# Patient Record
Sex: Female | Born: 1980 | Race: Black or African American | Hispanic: No | Marital: Married | Smoking: Former smoker
Health system: Southern US, Community
[De-identification: ages and names within clinical notes are randomized; demographics above are authoritative.]

## PROBLEM LIST (undated history)

## (undated) DIAGNOSIS — N289 Disorder of kidney and ureter, unspecified: Secondary | ICD-10-CM

## (undated) DIAGNOSIS — D649 Anemia, unspecified: Secondary | ICD-10-CM

## (undated) DIAGNOSIS — K219 Gastro-esophageal reflux disease without esophagitis: Secondary | ICD-10-CM

## (undated) DIAGNOSIS — Z9109 Other allergy status, other than to drugs and biological substances: Secondary | ICD-10-CM

## (undated) DIAGNOSIS — F32A Depression, unspecified: Secondary | ICD-10-CM

## (undated) DIAGNOSIS — F419 Anxiety disorder, unspecified: Secondary | ICD-10-CM

## (undated) DIAGNOSIS — J45909 Unspecified asthma, uncomplicated: Secondary | ICD-10-CM

## (undated) HISTORY — PX: ABDOMINAL HYSTERECTOMY: SHX81

## (undated) HISTORY — PX: TUBAL LIGATION: SHX77

## (undated) SURGERY — Surgical Case
Anesthesia: *Unknown

---

## 2004-11-13 ENCOUNTER — Emergency Department: Payer: Self-pay | Admitting: Emergency Medicine

## 2005-01-23 ENCOUNTER — Observation Stay: Payer: Self-pay

## 2005-01-25 ENCOUNTER — Ambulatory Visit: Payer: Self-pay

## 2005-03-11 ENCOUNTER — Observation Stay: Payer: Self-pay | Admitting: Obstetrics & Gynecology

## 2005-05-23 ENCOUNTER — Inpatient Hospital Stay: Payer: Self-pay

## 2005-07-29 ENCOUNTER — Emergency Department: Payer: Self-pay | Admitting: Emergency Medicine

## 2005-07-29 ENCOUNTER — Emergency Department: Payer: Self-pay | Admitting: Internal Medicine

## 2005-12-10 ENCOUNTER — Emergency Department: Payer: Self-pay | Admitting: Emergency Medicine

## 2005-12-18 ENCOUNTER — Emergency Department: Payer: Self-pay | Admitting: Emergency Medicine

## 2006-03-31 ENCOUNTER — Observation Stay: Payer: Self-pay | Admitting: Obstetrics and Gynecology

## 2006-06-10 ENCOUNTER — Inpatient Hospital Stay: Payer: Self-pay | Admitting: Obstetrics and Gynecology

## 2006-06-12 ENCOUNTER — Emergency Department: Payer: Self-pay | Admitting: Emergency Medicine

## 2006-07-05 ENCOUNTER — Ambulatory Visit: Payer: Self-pay | Admitting: Obstetrics and Gynecology

## 2006-07-09 ENCOUNTER — Observation Stay: Payer: Self-pay

## 2006-07-11 ENCOUNTER — Encounter: Payer: Self-pay | Admitting: Maternal & Fetal Medicine

## 2006-07-25 ENCOUNTER — Inpatient Hospital Stay: Payer: Self-pay

## 2006-07-30 ENCOUNTER — Emergency Department: Payer: Self-pay | Admitting: Emergency Medicine

## 2006-08-01 ENCOUNTER — Encounter: Payer: Self-pay | Admitting: Maternal & Fetal Medicine

## 2007-06-06 ENCOUNTER — Emergency Department: Payer: Self-pay | Admitting: Emergency Medicine

## 2008-12-14 ENCOUNTER — Emergency Department: Payer: Self-pay | Admitting: Emergency Medicine

## 2009-01-11 ENCOUNTER — Emergency Department: Payer: Self-pay | Admitting: Emergency Medicine

## 2009-02-12 ENCOUNTER — Emergency Department: Payer: Self-pay | Admitting: Emergency Medicine

## 2009-03-01 ENCOUNTER — Emergency Department: Payer: Self-pay | Admitting: Unknown Physician Specialty

## 2009-03-15 ENCOUNTER — Emergency Department: Payer: Self-pay | Admitting: Emergency Medicine

## 2010-08-22 ENCOUNTER — Ambulatory Visit: Payer: Self-pay | Admitting: Internal Medicine

## 2010-11-03 ENCOUNTER — Ambulatory Visit: Payer: Self-pay | Admitting: Internal Medicine

## 2011-01-02 ENCOUNTER — Ambulatory Visit: Payer: Self-pay | Admitting: Internal Medicine

## 2011-02-11 ENCOUNTER — Ambulatory Visit: Payer: Self-pay | Admitting: Internal Medicine

## 2011-07-30 ENCOUNTER — Ambulatory Visit: Payer: Self-pay | Admitting: Family Medicine

## 2011-10-23 ENCOUNTER — Ambulatory Visit: Payer: Self-pay | Admitting: Internal Medicine

## 2012-02-16 ENCOUNTER — Emergency Department: Payer: Self-pay | Admitting: Emergency Medicine

## 2012-03-11 ENCOUNTER — Ambulatory Visit: Payer: Self-pay | Admitting: Emergency Medicine

## 2012-11-28 ENCOUNTER — Ambulatory Visit: Payer: Self-pay | Admitting: Family Medicine

## 2013-04-03 ENCOUNTER — Ambulatory Visit: Payer: Self-pay | Admitting: Physician Assistant

## 2013-04-18 ENCOUNTER — Ambulatory Visit: Payer: Self-pay | Admitting: Physician Assistant

## 2013-07-03 ENCOUNTER — Emergency Department: Payer: Self-pay | Admitting: Emergency Medicine

## 2013-11-13 ENCOUNTER — Ambulatory Visit: Payer: Self-pay | Admitting: Family Medicine

## 2014-02-26 ENCOUNTER — Ambulatory Visit: Payer: Self-pay

## 2014-02-26 LAB — COMPREHENSIVE METABOLIC PANEL
ALBUMIN: 3.5 g/dL (ref 3.4–5.0)
ALK PHOS: 76 U/L
Anion Gap: 11 (ref 7–16)
BILIRUBIN TOTAL: 0.5 mg/dL (ref 0.2–1.0)
BUN: 11 mg/dL (ref 7–18)
CHLORIDE: 104 mmol/L (ref 98–107)
CO2: 24 mmol/L (ref 21–32)
Calcium, Total: 8.8 mg/dL (ref 8.5–10.1)
Creatinine: 0.83 mg/dL (ref 0.60–1.30)
GLUCOSE: 92 mg/dL (ref 65–99)
OSMOLALITY: 277 (ref 275–301)
POTASSIUM: 4 mmol/L (ref 3.5–5.1)
SGOT(AST): 14 U/L — ABNORMAL LOW (ref 15–37)
SGPT (ALT): 23 U/L
Sodium: 139 mmol/L (ref 136–145)
TOTAL PROTEIN: 6.6 g/dL (ref 6.4–8.2)

## 2014-02-26 LAB — CBC WITH DIFFERENTIAL/PLATELET
Basophil #: 0 10*3/uL (ref 0.0–0.1)
Basophil %: 0.9 %
EOS ABS: 0.3 10*3/uL (ref 0.0–0.7)
Eosinophil %: 4.7 %
HCT: 40 % (ref 35.0–47.0)
HGB: 12.9 g/dL (ref 12.0–16.0)
LYMPHS PCT: 36.5 %
Lymphocyte #: 2 10*3/uL (ref 1.0–3.6)
MCH: 28.3 pg (ref 26.0–34.0)
MCHC: 32.1 g/dL (ref 32.0–36.0)
MCV: 88 fL (ref 80–100)
MONO ABS: 0.3 x10 3/mm (ref 0.2–0.9)
MONOS PCT: 6.3 %
Neutrophil #: 2.9 10*3/uL (ref 1.4–6.5)
Neutrophil %: 51.6 %
Platelet: 267 10*3/uL (ref 150–440)
RBC: 4.55 10*6/uL (ref 3.80–5.20)
RDW: 14.5 % (ref 11.5–14.5)
WBC: 5.5 10*3/uL (ref 3.6–11.0)

## 2014-06-05 ENCOUNTER — Ambulatory Visit: Payer: Self-pay | Admitting: Internal Medicine

## 2014-08-15 ENCOUNTER — Other Ambulatory Visit: Payer: Self-pay

## 2014-08-15 ENCOUNTER — Emergency Department
Admission: EM | Admit: 2014-08-15 | Discharge: 2014-08-15 | Disposition: A | Discharge status: Home / Self Care | Payer: 59 | Attending: Emergency Medicine | Admitting: Emergency Medicine

## 2014-08-15 ENCOUNTER — Encounter: Payer: Self-pay | Admitting: Emergency Medicine

## 2014-08-15 DIAGNOSIS — Z7952 Long term (current) use of systemic steroids: Secondary | ICD-10-CM | POA: Insufficient documentation

## 2014-08-15 DIAGNOSIS — R0602 Shortness of breath: Secondary | ICD-10-CM | POA: Diagnosis present

## 2014-08-15 DIAGNOSIS — J45901 Unspecified asthma with (acute) exacerbation: Secondary | ICD-10-CM | POA: Diagnosis not present

## 2014-08-15 DIAGNOSIS — Z87891 Personal history of nicotine dependence: Secondary | ICD-10-CM | POA: Diagnosis not present

## 2014-08-15 HISTORY — DX: Unspecified asthma, uncomplicated: J45.909

## 2014-08-15 MED ORDER — PREDNISONE 20 MG PO TABS
ORAL_TABLET | ORAL | Status: AC
  Administered 2014-08-15: 40 mg via ORAL
  Filled 2014-08-15: qty 2

## 2014-08-15 MED ORDER — IPRATROPIUM-ALBUTEROL 0.5-2.5 (3) MG/3ML IN SOLN
3.0000 mL | Freq: Once | RESPIRATORY_TRACT | Status: AC
  Administered 2014-08-15: 3 mL via RESPIRATORY_TRACT

## 2014-08-15 MED ORDER — PREDNISONE 20 MG PO TABS
40.0000 mg | ORAL_TABLET | Freq: Once | ORAL | Status: AC
  Administered 2014-08-15: 40 mg via ORAL

## 2014-08-15 MED ORDER — IPRATROPIUM-ALBUTEROL 0.5-2.5 (3) MG/3ML IN SOLN
RESPIRATORY_TRACT | Status: AC
  Administered 2014-08-15: 3 mL via RESPIRATORY_TRACT
  Filled 2014-08-15: qty 3

## 2014-08-15 MED ORDER — PREDNISONE 20 MG PO TABS: 40.0000 mg | ORAL_TABLET | Freq: Every day | ORAL | Status: DC

## 2014-08-15 NOTE — ED Notes (Signed)
MD at bedside. 

## 2014-08-15 NOTE — ED Notes (Signed)
Pt reports that she feels SOB and congestion in morning and nighttime. States hx of allergies; this feels similar when seasonal allergies are bothered. Pt denies fever. Denies productive cough. Sx for 1 week. Pt has tried OTC drugs. Denies CP. Pt alert and oriented X4, active, cooperative, pt in NAD. RR even and unlabored, color WNL.

## 2014-08-15 NOTE — ED Provider Notes (Signed)
[facility] Emergency Department Provider Note    ____________________________________________  Time seen: 0805  I have reviewed the triage vital signs and the nursing notes.   HISTORY  Chief Complaint Shortness of Breath   History limited by: Not Limited   HPI Sabrina Holder is a 34 y.o. female who presents to the emergency department because of worsening shortness of breath and congestion in the nighttime and morning. She feels the congestion most in the base of her throat. This is been going on for roughly 1 week. It is worse in the mornings and at night. The symptoms are moderate. She has had similar symptoms in the past. Has tried taking her home nebulizer treatments without great effect. She has had a nonproductive cough. She denies any fevers.     Past Medical History  Diagnosis Date  . Asthma     There are no active problems to display for this patient.   Past Surgical History  Procedure Laterality Date  . Cesarean section    . Tubal ligation      Current Outpatient Rx  Name  Route  Sig  Dispense  Refill  . predniSONE (DELTASONE) 20 MG tablet   Oral   Take 2 tablets (40 mg total) by mouth daily. Start taking on 08/16/2014   8 tablet   0     Allergies Review of patient's allergies indicates no known allergies.  No family history on file.  Social History History  Substance Use Topics  . Smoking status: Former Research scientist (life sciences)  . Smokeless tobacco: Not on file  . Alcohol Use: No    Review of Systems  Constitutional: Negative for fever. Cardiovascular: Positive for chest tightness Respiratory: Positive for shortness of breath. Positive for nonproductive cough Gastrointestinal: Negative for abdominal pain, vomiting and diarrhea. Genitourinary: Negative for dysuria. Musculoskeletal: Negative for back pain. Skin: Negative for rash. Neurological: Negative for headaches, focal weakness or numbness.   10-point ROS otherwise  negative.  ____________________________________________   PHYSICAL EXAM:  VITAL SIGNS: ED Triage Vitals  Enc Vitals Group     BP 08/15/14 0751 120/70 mmHg     Pulse Rate 08/15/14 0751 84     Resp 08/15/14 0751 22     Temp 08/15/14 0751 98.5 F (36.9 C)     Temp Source 08/15/14 0751 Oral     SpO2 08/15/14 0751 97 %    Constitutional: Alert and oriented. Well appearing and in no distress. Eyes: Conjunctivae are normal. PERRL. Normal extraocular movements. ENT   Head: Normocephalic and atraumatic.   Nose: No congestion/rhinnorhea.   Mouth/Throat: Mucous membranes are moist.   Neck: No stridor. Hematological/Lymphatic/Immunilogical: No cervical lymphadenopathy. Cardiovascular: Normal rate, regular rhythm.  No murmurs, rubs, or gallops. Respiratory: Normal respiratory effort without tachypnea nor retractions. Diffuse bilateral expiratory wheeze Gastrointestinal: Soft and nontender. No distention.  Genitourinary: Deferred Musculoskeletal: Normal range of motion in all extremities. No joint effusions.  No lower extremity tenderness nor edema. Neurologic:  Normal speech and language. No gross focal neurologic deficits are appreciated. Speech is normal.  Skin:  Skin is warm, dry and intact. No rash noted. Psychiatric: Mood and affect are normal. Speech and behavior are normal. Patient exhibits appropriate insight and judgment.  ____________________________________________    LABS (pertinent positives/negatives)  None  ____________________________________________   EKG  EKG Time: 0804 Rate: 79 Rhythm: Normal sinus rhythm Axis: Normal Intervals: QTc 417 QRS: Q waves in V1 ST changes: No ST elevation    ____________________________________________  RADIOLOGY  None  ____________________________________________   PROCEDURES  Procedure(s) performed: None  Critical Care performed: No  ____________________________________________   INITIAL  IMPRESSION / ASSESSMENT AND PLAN / ED COURSE  Pertinent labs & imaging results that were available during my care of the patient were reviewed by me and considered in my medical decision making (see chart for details).  Patient here with shortness of breath that is worse at night and mornings. This is likely an asthma exacerbation. Patient without any other concerning findings for pneumonia. Will give steroids and neb treatment.  ____________________________________________   FINAL CLINICAL IMPRESSION(S) / ED DIAGNOSES  Final diagnoses:  Asthma exacerbation     Nance Pear, MD 08/15/14 [phone]

## 2014-08-15 NOTE — ED Notes (Signed)
Pt reports starting about a week ago difficulty breathing that is worse in the morning. Pt denies fever. Pt also reports nasal drainage. Pt also endorses dry cough

## 2014-08-15 NOTE — Discharge Instructions (Signed)
Please seek medical attention for any high fevers, chest pain, shortness of breath, change in behavior, persistent vomiting, bloody stool or any other new or concerning symptoms.  Asthma, Acute Bronchospasm Acute bronchospasm caused by asthma is also referred to as an asthma attack. Bronchospasm means your air passages become narrowed. The narrowing is caused by inflammation and tightening of the muscles in the air tubes (bronchi) in your lungs. This can make it hard to breathe or cause you to wheeze and cough. CAUSES Possible triggers are:  Animal dander from the skin, hair, or feathers of animals.  Dust mites contained in house dust.  Cockroaches.  Pollen from trees or grass.  Mold.  Cigarette or tobacco smoke.  Air pollutants such as dust, household cleaners, hair sprays, aerosol sprays, paint fumes, strong chemicals, or strong odors.  Cold air or weather changes. Cold air may trigger inflammation. Winds increase molds and pollens in the air.  Strong emotions such as crying or laughing hard.  Stress.  Certain medicines such as aspirin or beta-blockers.  Sulfites in foods and drinks, such as dried fruits and wine.  Infections or inflammatory conditions, such as a flu, cold, or inflammation of the nasal membranes (rhinitis).  Gastroesophageal reflux disease (GERD). GERD is a condition where stomach acid backs up into your esophagus.  Exercise or strenuous activity. SIGNS AND SYMPTOMS   Wheezing.  Excessive coughing, particularly at night.  Chest tightness.  Shortness of breath. DIAGNOSIS  Your health care provider will ask you about your medical history and perform a physical exam. A chest X-ray or blood testing may be performed to look for other causes of your symptoms or other conditions that may have triggered your asthma attack. TREATMENT  Treatment is aimed at reducing inflammation and opening up the airways in your lungs. Most asthma attacks are treated with  inhaled medicines. These include quick relief or rescue medicines (such as bronchodilators) and controller medicines (such as inhaled corticosteroids). These medicines are sometimes given through an inhaler or a nebulizer. Systemic steroid medicine taken by mouth or given through an IV tube also can be used to reduce the inflammation when an attack is moderate or severe. Antibiotic medicines are only used if a bacterial infection is present.  HOME CARE INSTRUCTIONS   Rest.  Drink plenty of liquids. This helps the mucus to remain thin and be easily coughed up. Only use caffeine in moderation and do not use alcohol until you have recovered from your illness.  Do not smoke. Avoid being exposed to secondhand smoke.  You play a critical role in keeping yourself in good health. Avoid exposure to things that cause you to wheeze or to have breathing problems.  Keep your medicines up-to-date and available. Carefully follow your health care provider's treatment plan.  Take your medicine exactly as prescribed.  When pollen or pollution is bad, keep windows closed and use an air conditioner or go to places with air conditioning.  Asthma requires careful medical care. See your health care provider for a follow-up as advised. If you are more than [redacted] weeks pregnant and you were prescribed any new medicines, let your obstetrician know about the visit and how you are doing. Follow up with your health care provider as directed.  After you have recovered from your asthma attack, make an appointment with your outpatient doctor to talk about ways to reduce the likelihood of future attacks. If you do not have a doctor who manages your asthma, make an appointment with a  primary care doctor to discuss your asthma. SEEK IMMEDIATE MEDICAL CARE IF:   You are getting worse.  You have trouble breathing. If severe, call your local emergency services (911 in the [place]  You develop chest pain or discomfort.  You are  vomiting.  You are not able to keep fluids down.  You are coughing up yellow, green, brown, or bloody sputum.  You have a fever and your symptoms suddenly get worse.  You have trouble swallowing. MAKE SURE YOU:   Understand these instructions.  Will watch your condition.  Will get help right away if you are not doing well or get worse. Document Released: 07/04/2006 Document Revised: 03/24/2013 Document Reviewed: 09/24/2012 [facility] Patient Information 2015 [place], [place]. This information is not intended to replace advice given to you by your health care provider. Make sure you discuss any questions you have with your health care provider.

## 2014-08-15 NOTE — ED Notes (Signed)
Pt alert and oriented X4, active, cooperative, pt in NAD. RR even and unlabored, color WNL.  Pt informed to return if any life threatening symptoms occur.  Pt states that she feels better after breathing treatment.

## 2014-10-14 ENCOUNTER — Ambulatory Visit
Admission: RE | Admit: 2014-10-14 | Discharge: 2014-10-14 | Disposition: A | Discharge status: Home / Self Care | Payer: 59 | Source: Ambulatory Visit | Attending: Family Medicine | Admitting: Family Medicine

## 2014-10-14 ENCOUNTER — Other Ambulatory Visit: Payer: Self-pay | Admitting: Family Medicine

## 2014-10-14 DIAGNOSIS — M79671 Pain in right foot: Secondary | ICD-10-CM

## 2015-02-27 ENCOUNTER — Encounter: Payer: Self-pay | Admitting: Emergency Medicine

## 2015-02-27 ENCOUNTER — Emergency Department: Payer: 59

## 2015-02-27 ENCOUNTER — Emergency Department
Admission: EM | Admit: 2015-02-27 | Discharge: 2015-02-27 | Disposition: A | Discharge status: Home / Self Care | Payer: 59 | Attending: Emergency Medicine | Admitting: Emergency Medicine

## 2015-02-27 DIAGNOSIS — Z87891 Personal history of nicotine dependence: Secondary | ICD-10-CM | POA: Diagnosis not present

## 2015-02-27 DIAGNOSIS — J45901 Unspecified asthma with (acute) exacerbation: Secondary | ICD-10-CM | POA: Diagnosis not present

## 2015-02-27 DIAGNOSIS — J45909 Unspecified asthma, uncomplicated: Secondary | ICD-10-CM | POA: Diagnosis present

## 2015-02-27 MED ORDER — IPRATROPIUM-ALBUTEROL 0.5-2.5 (3) MG/3ML IN SOLN
3.0000 mL | Freq: Once | RESPIRATORY_TRACT | Status: AC
  Administered 2015-02-27: 3 mL via RESPIRATORY_TRACT
  Filled 2015-02-27: qty 3

## 2015-02-27 MED ORDER — PREDNISONE 20 MG PO TABS: 40.0000 mg | ORAL_TABLET | Freq: Every day | ORAL | Status: DC

## 2015-02-27 MED ORDER — IPRATROPIUM-ALBUTEROL 0.5-2.5 (3) MG/3ML IN SOLN
3.0000 mL | Freq: Once | RESPIRATORY_TRACT | Status: AC
Start: 2015-02-27 — End: 2015-02-27
  Administered 2015-02-27: 3 mL via RESPIRATORY_TRACT
  Filled 2015-02-27: qty 3

## 2015-02-27 MED ORDER — PREDNISONE 20 MG PO TABS
60.0000 mg | ORAL_TABLET | Freq: Once | ORAL | Status: AC
  Administered 2015-02-27: 60 mg via ORAL
  Filled 2015-02-27: qty 3

## 2015-02-27 NOTE — ED Notes (Signed)
Usually has increased asthma symptoms during season change, denies fevers

## 2015-02-27 NOTE — Discharge Instructions (Signed)
Please seek medical attention for any high fevers, chest pain, shortness of breath, change in behavior, persistent vomiting, bloody stool or any other new or concerning symptoms. ° ° °Asthma, Acute Bronchospasm °Acute bronchospasm caused by asthma is also referred to as an asthma attack. Bronchospasm means your air passages become narrowed. The narrowing is caused by inflammation and tightening of the muscles in the air tubes (bronchi) in your lungs. This can make it hard to breathe or cause you to wheeze and cough. °CAUSES °Possible triggers are: °· Animal dander from the skin, hair, or feathers of animals. °· Dust mites contained in house dust. °· Cockroaches. °· Pollen from trees or grass. °· Mold. °· Cigarette or tobacco smoke. °· Air pollutants such as dust, household cleaners, hair sprays, aerosol sprays, paint fumes, strong chemicals, or strong odors. °· Cold air or weather changes. Cold air may trigger inflammation. Winds increase molds and pollens in the air. °· Strong emotions such as crying or laughing hard. °· Stress. °· Certain medicines such as aspirin or beta-blockers. °· Sulfites in foods and drinks, such as dried fruits and wine. °· Infections or inflammatory conditions, such as a flu, cold, or inflammation of the nasal membranes (rhinitis). °· Gastroesophageal reflux disease (GERD). GERD is a condition where stomach acid backs up into your esophagus. °· Exercise or strenuous activity. °SIGNS AND SYMPTOMS  °· Wheezing. °· Excessive coughing, particularly at night. °· Chest tightness. °· Shortness of breath. °DIAGNOSIS  °Your health care provider will ask you about your medical history and perform a physical exam. A chest X-ray or blood testing may be performed to look for other causes of your symptoms or other conditions that may have triggered your asthma attack.  °TREATMENT  °Treatment is aimed at reducing inflammation and opening up the airways in your lungs.  Most asthma attacks are treated with  inhaled medicines. These include quick relief or rescue medicines (such as bronchodilators) and controller medicines (such as inhaled corticosteroids). These medicines are sometimes given through an inhaler or a nebulizer. Systemic steroid medicine taken by mouth or given through an IV tube also can be used to reduce the inflammation when an attack is moderate or severe. Antibiotic medicines are only used if a bacterial infection is present.  °HOME CARE INSTRUCTIONS  °· Rest. °· Drink plenty of liquids. This helps the mucus to remain thin and be easily coughed up. Only use caffeine in moderation and do not use alcohol until you have recovered from your illness. °· Do not smoke. Avoid being exposed to secondhand smoke. °· You play a critical role in keeping yourself in good health. Avoid exposure to things that cause you to wheeze or to have breathing problems. °· Keep your medicines up-to-date and available. Carefully follow your health care provider's treatment plan. °· Take your medicine exactly as prescribed. °· When pollen or pollution is bad, keep windows closed and use an air conditioner or go to places with air conditioning. °· Asthma requires careful medical care. See your health care provider for a follow-up as advised. If you are more than [redacted] weeks pregnant and you were prescribed any new medicines, let your obstetrician know about the visit and how you are doing. Follow up with your health care provider as directed. °· After you have recovered from your asthma attack, make an appointment with your outpatient doctor to talk about ways to reduce the likelihood of future attacks. If you do not have a doctor who manages your asthma, make an appointment with   a primary care doctor to discuss your asthma. °SEEK IMMEDIATE MEDICAL CARE IF:  °· You are getting worse. °· You have trouble breathing. If severe, call your local emergency services (911 in the [place] °· You develop chest pain or discomfort. °· You are  vomiting. °· You are not able to keep fluids down. °· You are coughing up yellow, green, brown, or bloody sputum. °· You have a fever and your symptoms suddenly get worse. °· You have trouble swallowing. °MAKE SURE YOU:  °· Understand these instructions. °· Will watch your condition. °· Will get help right away if you are not doing well or get worse. °  °This information is not intended to replace advice given to you by your health care provider. Make sure you discuss any questions you have with your health care provider. °  °Document Released: 07/04/2006 Document Revised: 03/24/2013 Document Reviewed: 09/24/2012 °Elsevier Interactive Patient Education ©2016 [employer] ° °

## 2015-02-27 NOTE — ED Notes (Signed)
Pt discharged home after verbalizing understanding of discharge instructions; nad noted. 

## 2015-02-27 NOTE — ED Provider Notes (Signed)
[facility] Emergency Department Provider Note    ____________________________________________  Time seen: 17  I have reviewed the triage vital signs and the nursing notes.   HISTORY  Chief Complaint Asthma   History limited by: Not Limited   HPI Sabrina Holder is a 34 y.o. female with history of asthma who presents to the emergency department today with concerns for shortness of breath. She states this is been going on for the past couple of days. It has got progressively worse. She has tried using her breathing treatments with only minimal relief. She had some associated chest tightness. Patient states that she gets similar symptoms frequently when the weather changes. She feels like she has postnasal drip. She denies any fevers.   Past Medical History  Diagnosis Date  . Asthma     There are no active problems to display for this patient.   Past Surgical History  Procedure Laterality Date  . Cesarean section    . Tubal ligation      Current Outpatient Rx  Name  Route  Sig  Dispense  Refill  . predniSONE (DELTASONE) 20 MG tablet   Oral   Take 2 tablets (40 mg total) by mouth daily. Start taking on 08/16/2014   8 tablet   0     Allergies Review of patient's allergies indicates no known allergies.  No family history on file.  Social History Social History  Substance Use Topics  . Smoking status: Former Research scientist (life sciences)  . Smokeless tobacco: None  . Alcohol Use: No    Review of Systems  Constitutional: Negative for fever. Cardiovascular: Positive for chest tightness Respiratory: Positive for shortness of breath. Gastrointestinal: Negative for abdominal pain, vomiting and diarrhea. Genitourinary: Negative for dysuria. Musculoskeletal: Negative for back pain. Skin: Negative for rash. Neurological: Negative for headaches, focal weakness or numbness. 10-point ROS otherwise  negative.  ____________________________________________   PHYSICAL EXAM:  VITAL SIGNS: ED Triage Vitals  Enc Vitals Group     BP 02/27/15 0821 122/66 mmHg     Pulse Rate 02/27/15 0821 81     Resp 02/27/15 0821 20     Temp 02/27/15 0821 98.6 F (37 C)     Temp Source 02/27/15 0821 Oral     SpO2 --      Weight 02/27/15 0821 215 lb (97.523 kg)     Height 02/27/15 0821 5\' 4"  (1.626 m)   Constitutional: Alert and oriented. Well appearing and in no distress. Eyes: Conjunctivae are normal. PERRL. Normal extraocular movements. ENT   Head: Normocephalic and atraumatic.   Nose: No congestion/rhinnorhea.   Mouth/Throat: Mucous membranes are moist.   Neck: No stridor. Hematological/Lymphatic/Immunilogical: No cervical lymphadenopathy. Cardiovascular: Normal rate, regular rhythm.  No murmurs, rubs, or gallops. Respiratory: Normal respiratory effort without tachypnea nor retractions. Diffuse wheezes Gastrointestinal: Soft and nontender. No distention.  Genitourinary: Deferred Musculoskeletal: Normal range of motion in all extremities. No joint effusions.  No lower extremity tenderness nor edema. Neurologic:  Normal speech and language. No gross focal neurologic deficits are appreciated.  Skin:  Skin is warm, dry and intact. No rash noted. Psychiatric: Mood and affect are normal. Speech and behavior are normal. Patient exhibits appropriate insight and judgment.  ____________________________________________    LABS (pertinent positives/negatives)  None ____________________________________________   EKG  I, Nance Pear, attending physician, personally viewed and interpreted this EKG  EKG Time: 0847 Rate: 80 Rhythm: NSR Axis: normal Intervals: qtc 439 QRS: narrow ST changes: no st elevation Impression: normal ekg  ____________________________________________    RADIOLOGY  CXR  IMPRESSION: Negative chest  x-ray.   ____________________________________________   PROCEDURES  Procedure(s) performed: None  Critical Care performed: No  ____________________________________________   INITIAL IMPRESSION / ASSESSMENT AND PLAN / ED COURSE  Pertinent labs & imaging results that were available during my care of the patient were reviewed by me and considered in my medical decision making (see chart for details).  Patient presented to the emergency department today with some concerns for shortness of breath and asthma exacerbation. On initial exam patient did have diffuse bilateral wheezing. Patient was given prednisone and DuoNeb treatments. She did state she felt better after this treatment. The patient had much improved breath sounds with very minimal wheezing. Will discharge home with prednisone.  ____________________________________________   FINAL CLINICAL IMPRESSION(S) / ED DIAGNOSES  Final diagnoses:  Asthma exacerbation     Nance Pear, MD 02/27/15 1300

## 2015-03-23 ENCOUNTER — Encounter: Payer: Self-pay | Admitting: Emergency Medicine

## 2015-03-23 ENCOUNTER — Ambulatory Visit
Admission: EM | Admit: 2015-03-23 | Discharge: 2015-03-23 | Disposition: A | Discharge status: Home / Self Care | Payer: 59 | Attending: Family Medicine | Admitting: Family Medicine

## 2015-03-23 DIAGNOSIS — J45901 Unspecified asthma with (acute) exacerbation: Secondary | ICD-10-CM | POA: Diagnosis not present

## 2015-03-23 DIAGNOSIS — Z91048 Other nonmedicinal substance allergy status: Secondary | ICD-10-CM

## 2015-03-23 DIAGNOSIS — J9801 Acute bronchospasm: Secondary | ICD-10-CM | POA: Diagnosis not present

## 2015-03-23 DIAGNOSIS — Z9109 Other allergy status, other than to drugs and biological substances: Secondary | ICD-10-CM

## 2015-03-23 HISTORY — DX: Other allergy status, other than to drugs and biological substances: Z91.09

## 2015-03-23 MED ORDER — MONTELUKAST SODIUM 10 MG PO TABS: 10.0000 mg | ORAL_TABLET | Freq: Every day | ORAL | Status: DC

## 2015-03-23 MED ORDER — METHYLPREDNISOLONE SODIUM SUCC 125 MG IJ SOLR
125.0000 mg | Freq: Once | INTRAMUSCULAR | Status: AC
  Administered 2015-03-23: 125 mg via INTRAMUSCULAR

## 2015-03-23 MED ORDER — FEXOFENADINE-PSEUDOEPHED ER 180-240 MG PO TB24: 1.0000 | ORAL_TABLET | Freq: Every day | ORAL | Status: DC

## 2015-03-23 MED ORDER — IPRATROPIUM-ALBUTEROL 0.5-2.5 (3) MG/3ML IN SOLN
3.0000 mL | Freq: Four times a day (QID) | RESPIRATORY_TRACT | Status: DC
  Administered 2015-03-23 (×2): 3 mL via RESPIRATORY_TRACT

## 2015-03-23 MED ORDER — PREDNISONE 10 MG (21) PO TBPK: ORAL_TABLET | ORAL | Status: DC

## 2015-03-23 NOTE — ED Provider Notes (Signed)
CSN: [id number]     Arrival date & time 03/23/15  1706 History   First MD Initiated Contact with Patient 03/23/15 1805       Nurses notes were reviewed. Chief Complaint  Patient presents with  . Shortness of Breath   patient reports shortness of breath over 3 weeks.. She reports going to the ED she was placed on a 3 day regimen of prednisone but states that that not enough and a few days later she was just as bad as she was before. She reports shortness of breath is continued to get worse and finally she came in here to be seen. She states that she was unable to get into her PCPs office and wanted to have something done before Christmas she does have a history of bronchial callus and also asthma. She states that she's been having trouble with her allergies lately with increased congestion especially at night. She has been on Singulair before was currently out of it she states that she is running out of her nebulizer medication that she has a home and that she has used Zyrtec before but Zyrtec makes her sleepy so she is afraid to take it at night. She does not have any type of discoloration to sputum does not feel like there is an infection but that this is more from her allergies and asthma exacerbation.  (Consider location/radiation/quality/duration/timing/severity/associated sxs/prior Treatment) Patient is a 34 y.o. female presenting with shortness of breath. The history is provided by the patient. No language interpreter was used.  Shortness of Breath Severity:  Moderate Duration:  3 weeks Timing:  Constant Progression:  Worsening Chronicity:  New Context: known allergens, URI and weather changes   Relieved by:  Inhaler Ineffective treatments:  Inhaler and position changes Associated symptoms: cough   Associated symptoms: no chest pain, no fever, no rash, no sore throat and no swollen glands   Risk factors: no recent surgery and no tobacco use     Past Medical History  Diagnosis Date   . Asthma   . Environmental allergies    Past Surgical History  Procedure Laterality Date  . Cesarean section    . Tubal ligation     History reviewed. No pertinent family history. Social History  Substance Use Topics  . Smoking status: Former Research scientist (life sciences)  . Smokeless tobacco: None  . Alcohol Use: No   OB History    Gravida Para Term Preterm AB TAB SAB Ectopic Multiple Living   5 5 5             Review of Systems  Constitutional: Negative for fever.  HENT: Negative for sore throat.   Respiratory: Positive for cough and shortness of breath.   Cardiovascular: Negative for chest pain.  Skin: Negative for rash.  All other systems reviewed and are negative.   Allergies  Review of patient's allergies indicates no known allergies.  Home Medications   Prior to Admission medications   Medication Sig Start Date End Date Taking? Authorizing Provider  ALBUTEROL IN Inhale into the lungs.   Yes Historical Provider, MD  Cetirizine HCl (ZYRTEC ALLERGY PO) Take by mouth.   Yes Historical Provider, MD  fluticasone (FLONASE) 50 MCG/ACT nasal spray Place into both nostrils daily.   Yes Historical Provider, MD  Montelukast Sodium (SINGULAIR PO) Take by mouth.   Yes Historical Provider, MD  fexofenadine-pseudoephedrine (ALLEGRA-D ALLERGY & CONGESTION) 180-240 MG 24 hr tablet Take 1 tablet by mouth daily. 03/23/15   Frederich Cha, MD  montelukast (  SINGULAIR) 10 MG tablet Take 1 tablet (10 mg total) by mouth at bedtime. 03/23/15   Frederich Cha, MD  predniSONE (DELTASONE) 20 MG tablet Take 2 tablets (40 mg total) by mouth daily. 02/27/15   Nance Pear, MD  predniSONE (STERAPRED UNI-PAK 21 TAB) 10 MG (21) TBPK tablet Sig 6 tablet day 1, 5 tablets day 2, 4 tablets day 3,,3tablets day 4, 2 tablets day 5, 1 tablet day 6 take all tablets orally 03/23/15   Frederich Cha, MD   Meds Ordered and Administered this Visit   Medications  ipratropium-albuterol (DUONEB) 0.5-2.5 (3) MG/3ML nebulizer solution 3 mL  (3 mLs Nebulization Given 03/23/15 1802)  methylPREDNISolone sodium succinate (SOLU-MEDROL) 125 mg/2 mL injection 125 mg (not administered)    BP 115/67 mmHg  Pulse 82  Temp(Src) 98.5 F (36.9 C) (Oral)  Resp 18  Ht 5\' 4"  (1.626 m)  Wt 214 lb (97.07 kg)  BMI 36.72 kg/m2  SpO2 99%  LMP 02/05/2015 No data found.   Physical Exam  Constitutional: She is oriented to person, place, and time. She appears well-developed and well-nourished. She appears distressed.  HENT:  Head: Normocephalic and atraumatic.  Right Ear: External ear normal.  Left Ear: External ear normal.  Eyes: Conjunctivae are normal. Pupils are equal, round, and reactive to light.  Neck: Normal range of motion. Neck supple.  Cardiovascular: Normal rate and regular rhythm.   Pulmonary/Chest: Effort normal. She has wheezes.  Wheezes heard scattered left lung field mild and this is after receiving nebulizer treatment here.  Musculoskeletal: Normal range of motion.  Neurological: She is alert and oriented to person, place, and time.  Skin: Skin is warm and dry. She is not diaphoretic.  Psychiatric: She has a normal mood and affect.  Vitals reviewed.   ED Course  Procedures (including critical care time)  Labs Review Labs Reviewed - No data to display  Imaging Review No results found.   Visual Acuity Review  Right Eye Distance:   Left Eye Distance:   Bilateral Distance:    Right Eye Near:   Left Eye Near:    Bilateral Near:         MDM   1. Acute asthma exacerbation, unspecified asthma severity   2. Bronchospasm   3. Environmental allergies    After discussion we will Mr. 125 mg of Solu-Medrol IM. Started on prednisone the next day. Renew her Singulair 10 mg daily. Switch her from Zyrtec to Allegra-D 24 hours. And if we can find the pharmacist to verify what nebulizer solution she use will do that as well recommend she stay off work today and tomorrow and will recommend follow-up next week with  her PCP of her choice.    Frederich Cha, MD 03/23/15 [phone]

## 2015-03-23 NOTE — ED Notes (Signed)
Pt reports history of asthma, feels tight, SOB, wheezing. Takes meds but not helping.

## 2015-03-23 NOTE — Discharge Instructions (Signed)
Bronchospasm, Adult A bronchospasm is when the tubes that carry air in and out of your lungs (airways) spasm or tighten. During a bronchospasm it is hard to breathe. This is because the airways get smaller. A bronchospasm can be triggered by:  Allergies. These may be to animals, pollen, food, or mold.  Infection. This is a common cause of bronchospasm.  Exercise.  Irritants. These include pollution, cigarette smoke, strong odors, aerosol sprays, and paint fumes.  Weather changes.  Stress.  Being emotional. HOME CARE   Always have a plan for getting help. Know when to call your doctor and local emergency services (911 in the [place] Know where you can get emergency care.  Only take medicines as told by your doctor.  If you were prescribed an inhaler or nebulizer machine, ask your doctor how to use it correctly. Always use a spacer with your inhaler if you were given one.  Stay calm during an attack. Try to relax and breathe more slowly.  Control your home environment:  Change your heating and air conditioning filter at least once a month.  Limit your use of fireplaces and wood stoves.  Do not  smoke. Do not  allow smoking in your home.  Avoid perfumes and fragrances.  Get rid of pests (such as roaches and mice) and their droppings.  Throw away plants if you see mold on them.  Keep your house clean and dust free.  Replace carpet with wood, tile, or vinyl flooring. Carpet can trap dander and dust.  Use allergy-proof pillows, mattress covers, and box spring covers.  Wash bed sheets and blankets every week in hot water. Dry them in a dryer.  Use blankets that are made of polyester or cotton.  Wash hands frequently. GET HELP IF:  You have muscle aches.  You have chest pain.  The thick spit you spit or cough up (sputum) changes from clear or white to yellow, green, gray, or bloody.  The thick spit you spit or cough up gets thicker.  There are problems that may be  related to the medicine you are given such as:  A rash.  Itching.  Swelling.  Trouble breathing. GET HELP RIGHT AWAY IF:  You feel you cannot breathe or catch your breath.  You cannot stop coughing.  Your treatment is not helping you breathe better.  You have very bad chest pain. MAKE SURE YOU:   Understand these instructions.  Will watch your condition.  Will get help right away if you are not doing well or get worse.   This information is not intended to replace advice given to you by your health care provider. Make sure you discuss any questions you have with your health care provider.   Document Released: 01/14/2009 Document Revised: 04/09/2014 Document Reviewed: 09/09/2012 Elsevier Interactive Patient Education 2016 Else[employer]  Cough, Adult A cough helps to clear your throat and lungs. A cough may last only 2-3 weeks (acute), or it may last longer than 8 weeks (chronic). Many different things can cause a cough. A cough may be a sign of an illness or another medical condition. HOME CARE  Pay attention to any changes in your cough.  Take medicines only as told by your doctor.  If you were prescribed an antibiotic medicine, take it as told by your doctor. Do not stop taking it even if you start to feel better.  Talk with your doctor before you try using a cough medicine.  Drink enough fluid to keep  your pee (urine) clear or pale yellow.  If the air is dry, use a cold steam vaporizer or humidifier in your home.  Stay away from things that make you cough at work or at home.  If your cough is worse at night, try using extra pillows to raise your head up higher while you sleep.  Do not smoke, and try not to be around smoke. If you need help quitting, ask your doctor.  Do not have caffeine.  Do not drink alcohol.  Rest as needed. GET HELP IF:  You have new problems (symptoms).  You cough up yellow fluid (pus).  Your cough does not get better after 2-3  weeks, or your cough gets worse.  Medicine does not help your cough and you are not sleeping well.  You have pain that gets worse or pain that is not helped with medicine.  You have a fever.  You are losing weight and you do not know why.  You have night sweats. GET HELP RIGHT AWAY IF:  You cough up blood.  You have trouble breathing.  Your heartbeat is very fast.   This information is not intended to replace advice given to you by your health care provider. Make sure you discuss any questions you have with your health care provider.   Document Released: 11/30/2010 Document Revised: 12/08/2014 Document Reviewed: 05/26/2014 Elsevier Interactive Patient Education [employer].  Allergies An allergy is when your body reacts to a substance in a way that is not normal. An allergic reaction can happen after you:  Eat something.  Breathe in something.  Touch something. WHAT KINDS OF ALLERGIES ARE THERE? You can be allergic to:  Things that are only around during certain seasons, like molds and pollens.  Foods.  Drugs.  Insects.  Animal dander. WHAT ARE SYMPTOMS OF ALLERGIES?  Puffiness (swelling). This may happen on the lips, face, tongue, mouth, or throat.  Sneezing.  Coughing.  Breathing loudly (wheezing).  Stuffy nose.  Tingling in the mouth.  A rash.  Itching.  Itchy, red, puffy areas of skin (hives).  Watery eyes.  Throwing up (vomiting).  Watery poop (diarrhea).  Dizziness.  Feeling faint or fainting.  Trouble breathing or swallowing.  A tight feeling in the chest.  A fast heartbeat. HOW ARE ALLERGIES DIAGNOSED? Allergies can be diagnosed with:  A medical and family history.  Skin tests.  Blood tests.  A food diary. A food diary is a record of all the foods, drinks, and symptoms you have each day.  The results of an elimination diet. This diet involves making sure not to eat certain foods and then seeing what happens when  you start eating them again. HOW ARE ALLERGIES TREATED? There is no cure for allergies, but allergic reactions can be treated with medicine. Severe reactions usually need to be treated at a hospital.  HOW CAN REACTIONS BE PREVENTED? The best way to prevent an allergic reaction is to avoid the thing you are allergic to. Allergy shots and medicines can also help prevent reactions in some cases.   This information is not intended to replace advice given to you by your health care provider. Make sure you discuss any questions you have with your health care provider.   Document Released: 07/14/2012 Document Revised: 04/09/2014 Document Reviewed: 12/29/2013 Elsevier Interactive Patient Education [employer].

## 2015-09-05 ENCOUNTER — Emergency Department
Admission: EM | Admit: 2015-09-05 | Discharge: 2015-09-05 | Disposition: A | Discharge status: Home / Self Care | Payer: 59 | Attending: Emergency Medicine | Admitting: Emergency Medicine

## 2015-09-05 ENCOUNTER — Encounter: Payer: Self-pay | Admitting: Emergency Medicine

## 2015-09-05 DIAGNOSIS — F419 Anxiety disorder, unspecified: Secondary | ICD-10-CM | POA: Insufficient documentation

## 2015-09-05 DIAGNOSIS — F17210 Nicotine dependence, cigarettes, uncomplicated: Secondary | ICD-10-CM | POA: Insufficient documentation

## 2015-09-05 DIAGNOSIS — J45909 Unspecified asthma, uncomplicated: Secondary | ICD-10-CM | POA: Diagnosis not present

## 2015-09-05 DIAGNOSIS — F5102 Adjustment insomnia: Secondary | ICD-10-CM | POA: Insufficient documentation

## 2015-09-05 DIAGNOSIS — Z79899 Other long term (current) drug therapy: Secondary | ICD-10-CM | POA: Insufficient documentation

## 2015-09-05 HISTORY — DX: Anxiety disorder, unspecified: F41.9

## 2015-09-05 MED ORDER — HYDROXYZINE PAMOATE 25 MG PO CAPS: 25.0000 mg | ORAL_CAPSULE | Freq: Four times a day (QID) | ORAL | Status: DC | PRN

## 2015-09-05 NOTE — ED Notes (Signed)
States anxiety x 1 week, history of same, denies thoughts of harming self. Denies situational change.

## 2015-09-05 NOTE — Discharge Instructions (Signed)
Generalized Anxiety Disorder Generalized anxiety disorder (GAD) is a mental disorder. It interferes with life functions, including relationships, work, and school. GAD is different from normal anxiety, which everyone experiences at some point in their lives in response to specific life events and activities. Normal anxiety actually helps [place] prepare for and get through these life events and activities. Normal anxiety goes away after the event or activity is over.  GAD causes anxiety that is not necessarily related to specific events or activities. It also causes excess anxiety in proportion to specific events or activities. The anxiety associated with GAD is also difficult to control. GAD can vary from mild to severe. People with severe GAD can have intense waves of anxiety with physical symptoms (panic attacks).  SYMPTOMS The anxiety and worry associated with GAD are difficult to control. This anxiety and worry are related to many life events and activities and also occur more days than not for 6 months or longer. People with GAD also have three or more of the following symptoms (one or more in children):  Restlessness.   Fatigue.  Difficulty concentrating.   Irritability.  Muscle tension.  Difficulty sleeping or unsatisfying sleep. DIAGNOSIS GAD is diagnosed through an assessment by your health care provider. Your health care provider will ask you questions aboutyour mood,physical symptoms, and events in your life. Your health care provider may ask you about your medical history and use of alcohol or drugs, including prescription medicines. Your health care provider may also do a physical exam and blood tests. Certain medical conditions and the use of certain substances can cause symptoms similar to those associated with GAD. Your health care provider may refer you to a mental health specialist for further evaluation. TREATMENT The following therapies are usually used to treat GAD:    Medication. Antidepressant medication usually is prescribed for long-term daily control. Antianxiety medicines may be added in severe cases, especially when panic attacks occur.   Talk therapy (psychotherapy). Certain types of talk therapy can be helpful in treating GAD by providing support, education, and guidance. A form of talk therapy called cognitive behavioral therapy can teach you healthy ways to think about and react to daily life events and activities.  Stress managementtechniques. These include yoga, meditation, and exercise and can be very helpful when they are practiced regularly. A mental health specialist can help determine which treatment is best for you. Some people see improvement with one therapy. However, other people require a combination of therapies.   This information is not intended to replace advice given to you by your health care provider. Make sure you discuss any questions you have with your health care provider.   Document Released: 07/14/2012 Document Revised: 04/09/2014 Document Reviewed: 07/14/2012 Elsevier Interactive Patient Education 2016 [place].  Insomnia Insomnia is a sleep disorder that makes it difficult to fall asleep or to stay asleep. Insomnia can cause tiredness (fatigue), low energy, difficulty concentrating, mood swings, and poor performance at work or school.  There are three different ways to classify insomnia:  Difficulty falling asleep.  Difficulty staying asleep.  Waking up too early in the morning. Any type of insomnia can be long-term (chronic) or short-term (acute). Both are common. Short-term insomnia usually lasts for three months or less. Chronic insomnia occurs at least three times a week for longer than three months. CAUSES  Insomnia may be caused by another condition, situation, or substance, such as:  Anxiety.  Certain medicines.  Gastroesophageal reflux disease (GERD) or other gastrointestinal  conditions.  Asthma or other breathing conditions.  Restless legs syndrome, sleep apnea, or other sleep disorders.  Chronic pain.  Menopause. This may include hot flashes.  Stroke.  Abuse of alcohol, tobacco, or illegal drugs.  Depression.  Caffeine.   Neurological disorders, such as Alzheimer disease.  An overactive thyroid (hyperthyroidism). The cause of insomnia may not be known. RISK FACTORS Risk factors for insomnia include:  Gender. Women are more commonly affected than men.  Age. Insomnia is more common as you get older.  Stress. This may involve your professional or personal life.  Income. Insomnia is more common in people with lower income.  Lack of exercise.   Irregular work schedule or night shifts.  Traveling between different time zones. SIGNS AND SYMPTOMS If you have insomnia, trouble falling asleep or trouble staying asleep is the main symptom. This may lead to other symptoms, such as:  Feeling fatigued.  Feeling nervous about going to sleep.  Not feeling rested in the morning.  Having trouble concentrating.  Feeling irritable, anxious, or depressed. TREATMENT  Treatment for insomnia depends on the cause. If your insomnia is caused by an underlying condition, treatment will focus on addressing the condition. Treatment may also include:   Medicines to help you sleep.  Counseling or therapy.  Lifestyle adjustments. HOME CARE INSTRUCTIONS   Take medicines only as directed by your health care provider.  Keep regular sleeping and waking hours. Avoid naps.  Keep a sleep diary to help you and your health care provider figure out what could be causing your insomnia. Include:   When you sleep.  When you wake up during the night.  How well you sleep.   How rested you feel the next day.  Any side effects of medicines you are taking.  What you eat and drink.   Make your bedroom a comfortable place where it is easy to fall  asleep:  Put up shades or special blackout curtains to block light from outside.  Use a white noise machine to block noise.  Keep the temperature cool.   Exercise regularly as directed by your health care provider. Avoid exercising right before bedtime.  Use relaxation techniques to manage stress. Ask your health care provider to suggest some techniques that may work well for you. These may include:  Breathing exercises.  Routines to release muscle tension.  Visualizing peaceful scenes.  Cut back on alcohol, caffeinated beverages, and cigarettes, especially close to bedtime. These can disrupt your sleep.  Do not overeat or eat spicy foods right before bedtime. This can lead to digestive discomfort that can make it hard for you to sleep.  Limit screen use before bedtime. This includes:  Watching TV.  Using your smartphone, tablet, and computer.  Stick to a routine. This can help you fall asleep faster. Try to do a quiet activity, brush your teeth, and go to bed at the same time each night.  Get out of bed if you are still awake after 15 minutes of trying to sleep. Keep the lights down, but try reading or doing a quiet activity. When you feel sleepy, go back to bed.  Make sure that you drive carefully. Avoid driving if you feel very sleepy.  Keep all follow-up appointments as directed by your health care provider. This is important. SEEK MEDICAL CARE IF:   You are tired throughout the day or have trouble in your daily routine due to sleepiness.  You continue to have sleep problems or your sleep problems  get worse. SEEK IMMEDIATE MEDICAL CARE IF:   You have serious thoughts about hurting yourself or someone else.   This information is not intended to replace advice given to you by your health care provider. Make sure you discuss any questions you have with your health care provider.   Document Released: 03/16/2000 Document Revised: 12/08/2014 Document Reviewed:  12/18/2013 Elsevier Interactive Patient Education 2016 [place] the prescription med as directed for anxiety and poor sleep. Follow-up with [place] or [facility] for continued symptoms.

## 2015-09-05 NOTE — ED Notes (Signed)
States she is not sleeping well  Anxious  Has 6 children and under stress   Denies any si or hi

## 2015-09-05 NOTE — ED Provider Notes (Signed)
[facility] Emergency Department Provider Note ____________________________________________  Time seen: 1130  I have reviewed the triage vital signs and the nursing notes.  HISTORY  Chief Complaint  Anxiety  HPI Sabrina Holder is a 35 y.o. female presents to the ED for evaluation of poor sleep and anxiety. The patient describes increased stress and anxiety in her home and reports to the ED for possible treatment. She gives a history of depression from some 10 years prior, that was treated primarily with counseling. She also describes a one point she was on Zoloft for management of anxiety and depression but after several years discontinue the medication on her own accord. She is note dictated increased sleep disruption overnight, low energy, and lack of interest in her kid's activities. She describes almost as if she is constantly "on age.". She denies any suicidal/homicidalideations, concern or risk of abuse or neglect at home. She also denies any problems with the job or family. She is married with 6 children and works full time. She denies any alcohol or drug intake. Her only medical conditions include allergy and asthma which she takes medications as directed. She is followed by her provider at [facility], but denies having this discussion with her provider about her ongoing problems with increased anxiety and insomnia. She denies any other symptoms at this time and has not missed work due to her symptoms.  Past Medical History  Diagnosis Date  . Asthma   . Environmental allergies   . Anxiety     There are no active problems to display for this patient.  Past Surgical History  Procedure Laterality Date  . Cesarean section    . Tubal ligation      Current Outpatient Rx  Name  Route  Sig  Dispense  Refill  . ALBUTEROL IN   Inhalation   Inhale into the lungs.         . fexofenadine-pseudoephedrine (ALLEGRA-D ALLERGY & CONGESTION) 180-240 MG  24 hr tablet   Oral   Take 1 tablet by mouth daily.   30 tablet   0   . fluticasone (FLONASE) 50 MCG/ACT nasal spray   Each Nare   Place into both nostrils daily.         . hydrOXYzine (VISTARIL) 25 MG capsule   Oral   Take 1 capsule (25 mg total) by mouth every 6 (six) hours as needed for anxiety.   30 capsule   0   . montelukast (SINGULAIR) 10 MG tablet   Oral   Take 1 tablet (10 mg total) by mouth at bedtime.   30 tablet   1   . Montelukast Sodium (SINGULAIR PO)   Oral   Take by mouth.         . predniSONE (STERAPRED UNI-PAK 21 TAB) 10 MG (21) TBPK tablet      Sig 6 tablet day 1, 5 tablets day 2, 4 tablets day 3,,3tablets day 4, 2 tablets day 5, 1 tablet day 6 take all tablets orally   21 tablet   0    Allergies Review of patient's allergies indicates no known allergies.  No family history on file.  Social History Social History  Substance Use Topics  . Smoking status: Current Every Day Smoker -- 0.10 packs/day    Types: Cigarettes  . Smokeless tobacco: None  . Alcohol Use: No   Review of Systems  Constitutional: Negative for fever. Cardiovascular: Negative for chest pain. Respiratory: Negative for shortness of breath.  Musculoskeletal: Negative for back pain. Skin: Negative for rash. Neurological: Negative for headaches, focal weakness or numbness. Psychological: Reports anxiety and insomnia.  ____________________________________________  PHYSICAL EXAM:  VITAL SIGNS: ED Triage Vitals  Enc Vitals Group     BP 09/05/15 1026 137/86 mmHg     Pulse Rate 09/05/15 1026 85     Resp 09/05/15 1026 18     Temp 09/05/15 1026 98.6 F (37 C)     Temp Source 09/05/15 1026 Oral     SpO2 09/05/15 1026 98 %     Weight 09/05/15 1026 212 lb (96.163 kg)     Height 09/05/15 1026 5\' 4"  (1.626 m)     Head Cir --      Peak Flow --      Pain Score 09/05/15 1026 5     Pain Loc --      Pain Edu? --      Excl. in [place]? --    Constitutional: Alert and oriented.  Well appearing and in no distress. Head: Normocephalic and atraumatic. Cardiovascular: Normal rate, regular rhythm.  Respiratory: Normal respiratory effort. No wheezes/rales/rhonchi. Musculoskeletal: Nontender with normal range of motion in all extremities.  Neurologic: Normal speech and language. No gross focal neurologic deficits are appreciated. Skin:  Skin is warm, dry and intact. No rash noted. Psychiatric: Mood and affect are normal. Patient exhibits appropriate insight and judgment. She cries during interview.  ____________________________________________  INITIAL IMPRESSION / ASSESSMENT AND PLAN / ED COURSE  Patient with a 10-year history is poorly managed anxiety and depression. She reports intermittent insomnia and lack of energy. She is referred to her primary care provider at [facility] as well as [place]. She will be provided a prescription for hydroxyzine to dose as directed.  ____________________________________________  FINAL CLINICAL IMPRESSION(S) / ED DIAGNOSES  Final diagnoses:  Anxiety  Insomnia due to stress     Melvenia Needles, PA-C 09/05/15 [place], MD 09/06/15 2157

## 2016-01-11 ENCOUNTER — Encounter: Payer: Self-pay | Admitting: Emergency Medicine

## 2016-01-11 ENCOUNTER — Ambulatory Visit
Admission: EM | Admit: 2016-01-11 | Discharge: 2016-01-11 | Disposition: A | Discharge status: Home / Self Care | Payer: 59 | Attending: Family Medicine | Admitting: Family Medicine

## 2016-01-11 DIAGNOSIS — F3289 Other specified depressive episodes: Secondary | ICD-10-CM | POA: Diagnosis not present

## 2016-01-11 MED ORDER — SERTRALINE HCL 50 MG PO TABS: 50.0000 mg | ORAL_TABLET | Freq: Every day | ORAL | 0 refills | Status: DC

## 2016-01-11 NOTE — ED Triage Notes (Signed)
Patient c/o feeling depressed for couple of years.  Patient states that she has been managing her depression with alcohol.  Patient states being on antidepressant medicine many years ago.  Patient reports stress at home with her husband losing his job.  Patient states that she did not have to money to go see her PCP today.

## 2016-01-11 NOTE — ED Provider Notes (Signed)
[facility]    CSN: [id number] Arrival date & time: 01/11/16  1153     History   Chief Complaint Chief Complaint  Patient presents with  . Depression    HPI Sabrina Holder is a 35 y.o. female.   35 yo female with a c/o feeling depressed for couple of years.  Patient states that she has been managing her depression with alcohol.  Patient states being on antidepressant medicine many years ago.  Patient reports stress at home with her husband losing his job.  Patient states that she did not have to money to go see her PCP today. Patient denies suicidal or homicidal ideation.    The history is provided by the patient.  Depression  This is a chronic problem.    Past Medical History:  Diagnosis Date  . Anxiety   . Asthma   . Environmental allergies     There are no active problems to display for this patient.   Past Surgical History:  Procedure Laterality Date  . CESAREAN SECTION    . TUBAL LIGATION      OB History    Gravida Para Term Preterm AB Living   5 5 5          SAB TAB Ectopic Multiple Live Births                   Home Medications    Prior to Admission medications   Medication Sig Start Date End Date Taking? Authorizing Provider  ALBUTEROL IN Inhale into the lungs.    Historical Provider, MD  fexofenadine-pseudoephedrine (ALLEGRA-D ALLERGY & CONGESTION) 180-240 MG 24 hr tablet Take 1 tablet by mouth daily. 03/23/15   Frederich Cha, MD  fluticasone [facility]) 50 MCG/ACT nasal spray Place into both nostrils daily.    Historical Provider, MD  montelukast (SINGULAIR) 10 MG tablet Take 1 tablet (10 mg total) by mouth at bedtime. 03/23/15   Frederich Cha, MD  Montelukast Sodium (SINGULAIR PO) Take by mouth.    Historical Provider, MD  sertraline (ZOLOFT) 50 MG tablet Take 1 tablet (50 mg total) by mouth daily. 01/11/16   Norval Gable, MD    Family History History reviewed. No pertinent family history.  Social History Social History    Substance Use Topics  . Smoking status: Current Every Day Smoker    Packs/day: 0.10    Types: Cigarettes  . Smokeless tobacco: Never Used  . Alcohol use No     Allergies   Review of patient's allergies indicates no known allergies.   Review of Systems Review of Systems  Psychiatric/Behavioral: Positive for depression.     Physical Exam Triage Vital Signs ED Triage Vitals  Enc Vitals Group     BP 01/11/16 1209 138/70     Pulse Rate 01/11/16 1209 87     Resp 01/11/16 1209 16     Temp 01/11/16 1209 97.3 F (36.3 C)     Temp Source 01/11/16 1209 Tympanic     SpO2 01/11/16 1209 100 %     Weight 01/11/16 1207 228 lb (103.4 kg)     Height 01/11/16 1207 5\' 4"  (1.626 m)     Head Circumference --      Peak Flow --      Pain Score 01/11/16 1208 0     Pain Loc --      Pain Edu? --      Excl. in [place]? --    No data found.   Updated  Vital Signs BP 138/70 (BP Location: Right Arm)   Pulse 87   Temp 97.3 F (36.3 C) (Tympanic)   Resp 16   Ht 5\' 4"  (1.626 m)   Wt 228 lb (103.4 kg)   LMP 12/28/2015 (Approximate)   SpO2 100%   BMI 39.14 kg/m   Visual Acuity Right Eye Distance:   Left Eye Distance:   Bilateral Distance:    Right Eye Near:   Left Eye Near:    Bilateral Near:     Physical Exam  Constitutional: She is oriented to person, place, and time. She appears well-developed and well-nourished. No distress.  Neurological: She is alert and oriented to person, place, and time.  Skin: She is not diaphoretic.  Psychiatric: Her behavior is normal. Judgment and thought content normal. Cognition and memory are normal. She exhibits a depressed mood.  Denies suicidal or homicidal ideation  Nursing note and vitals reviewed.    UC Treatments / Results  Labs (all labs ordered are listed, but only abnormal results are displayed) Labs Reviewed - No data to display  EKG  EKG Interpretation None       Radiology No results found.  Procedures Procedures  (including critical care time)  Medications Ordered in UC Medications - No data to display   Initial Impression / Assessment and Plan / UC Course  I have reviewed the triage vital signs and the nursing notes.  Pertinent labs & imaging results that were available during my care of the patient were reviewed by me and considered in my medical decision making (see chart for details).  Clinical Course      Final Clinical Impressions(s) / UC Diagnoses   Final diagnoses:  Other depression    New Prescriptions Discharge Medication List as of 01/11/2016 12:53 PM    START taking these medications   Details  sertraline (ZOLOFT) 50 MG tablet Take 1 tablet (50 mg total) by mouth daily., Starting Wed 01/11/2016, Normal       1. diagnosis reviewed with patient 2. rx as per orders above; reviewed possible side effects, interactions, risks and benefits  3. Recommend patient follow up with her PCP in the next 1-2 weeks for further management; discussed and provided written information to patient on depression and resources   Norval Gable, MD 01/11/16 1303

## 2016-02-12 ENCOUNTER — Encounter: Payer: Self-pay | Admitting: Emergency Medicine

## 2016-02-12 ENCOUNTER — Emergency Department
Admission: EM | Admit: 2016-02-12 | Discharge: 2016-02-12 | Disposition: A | Discharge status: Home / Self Care | Payer: 59 | Attending: Student in an Organized Health Care Education/Training Program | Admitting: Student in an Organized Health Care Education/Training Program

## 2016-02-12 ENCOUNTER — Emergency Department: Payer: 59

## 2016-02-12 DIAGNOSIS — F17210 Nicotine dependence, cigarettes, uncomplicated: Secondary | ICD-10-CM | POA: Diagnosis not present

## 2016-02-12 DIAGNOSIS — J45901 Unspecified asthma with (acute) exacerbation: Secondary | ICD-10-CM | POA: Diagnosis not present

## 2016-02-12 DIAGNOSIS — Z79899 Other long term (current) drug therapy: Secondary | ICD-10-CM | POA: Insufficient documentation

## 2016-02-12 DIAGNOSIS — R0602 Shortness of breath: Secondary | ICD-10-CM | POA: Diagnosis present

## 2016-02-12 LAB — TROPONIN I: Troponin I: <0.03 ng/mL (ref <0.03)

## 2016-02-12 LAB — CBC
HCT: 36.4 % (ref 35.0–47.0)
Hemoglobin: 11.9 g/dL — ABNORMAL LOW (ref 12.0–16.0)
MCH: 27.1 pg (ref 26.0–34.0)
MCHC: 32.8 g/dL (ref 32.0–36.0)
MCV: 82.6 fL (ref 80.0–100.0)
Platelets: 297 10*3/uL (ref 150–440)
RBC: 4.4 MIL/uL (ref 3.80–5.20)
RDW: 16 % — ABNORMAL HIGH (ref 11.5–14.5)
WBC: 7 10*3/uL (ref 3.6–11.0)

## 2016-02-12 LAB — BASIC METABOLIC PANEL
ANION GAP: 6 (ref 5–15)
BUN: 13 mg/dL (ref 6–20)
CALCIUM: 8.9 mg/dL (ref 8.9–10.3)
CO2: 23 mmol/L (ref 22–32)
Chloride: 108 mmol/L (ref 101–111)
Creatinine, Ser: 0.79 mg/dL (ref 0.44–1.00)
GFR calc Af Amer: >60 mL/min (ref >60)
GLUCOSE: 99 mg/dL (ref 65–99)
Potassium: 3.9 mmol/L (ref 3.5–5.1)
Sodium: 137 mmol/L (ref 135–145)

## 2016-02-12 MED ORDER — ALBUTEROL SULFATE (2.5 MG/3ML) 0.083% IN NEBU
5.0000 mg | INHALATION_SOLUTION | Freq: Once | RESPIRATORY_TRACT | Status: AC
  Administered 2016-02-12: 5 mg via RESPIRATORY_TRACT
  Filled 2016-02-12: qty 6

## 2016-02-12 MED ORDER — PREDNISONE 20 MG PO TABS
50.0000 mg | ORAL_TABLET | Freq: Every day | ORAL | 0 refills | Status: AC
Start: 2016-02-12 — End: 2016-02-17

## 2016-02-12 MED ORDER — ALBUTEROL SULFATE HFA 108 (90 BASE) MCG/ACT IN AERS: 2.0000 | INHALATION_SPRAY | Freq: Four times a day (QID) | RESPIRATORY_TRACT | 2 refills | Status: DC | PRN

## 2016-02-12 MED ORDER — IPRATROPIUM-ALBUTEROL 0.5-2.5 (3) MG/3ML IN SOLN
3.0000 mL | Freq: Once | RESPIRATORY_TRACT | Status: AC
  Administered 2016-02-12: 3 mL via RESPIRATORY_TRACT
  Filled 2016-02-12: qty 3

## 2016-02-12 MED ORDER — PREDNISONE 20 MG PO TABS
60.0000 mg | ORAL_TABLET | Freq: Once | ORAL | Status: AC
  Administered 2016-02-12: 60 mg via ORAL
  Filled 2016-02-12: qty 3

## 2016-02-12 NOTE — ED Provider Notes (Signed)
[facility] Emergency Department Provider Note    First MD Initiated Contact with Patient 02/12/16 [phone]     (approximate)  I have reviewed the triage vital signs and the nursing notes.   HISTORY  Chief Complaint Shortness of Breath    HPI Sabrina Holder is a 35 y.o. female who presents with several days of worsening cough and shortness of breath. States that she was diagnosed with pneumonia one month ago and was treated with antibiotics. States that she does have a history of asthma and over the past week is been feeling more short of breath particularly with exertion. Can hear herself wheezing at night. She's been taking her nebulizer treatments at home for the past 2 days without significant improvement. She's not been on any nebulizer treatments recently. Denies any fevers but states she has body aches everywhere. She is not on any birth control. No recent prolonged travel. Denies any chest pain or pressure. Denies any abdominal pain.   Past Medical History:  Diagnosis Date  . Anxiety   . Asthma   . Environmental allergies    No family history on file. Past Surgical History:  Procedure Laterality Date  . CESAREAN SECTION    . TUBAL LIGATION     There are no active problems to display for this patient.     Prior to Admission medications   Medication Sig Start Date End Date Taking? Authorizing Provider  ALBUTEROL IN Inhale into the lungs.    Historical Provider, MD  fexofenadine-pseudoephedrine (ALLEGRA-D ALLERGY & CONGESTION) 180-240 MG 24 hr tablet Take 1 tablet by mouth daily. 03/23/15   Frederich Cha, MD  fluticasone [facility]) 50 MCG/ACT nasal spray Place into both nostrils daily.    Historical Provider, MD  montelukast (SINGULAIR) 10 MG tablet Take 1 tablet (10 mg total) by mouth at bedtime. 03/23/15   Frederich Cha, MD  Montelukast Sodium (SINGULAIR PO) Take by mouth.    Historical Provider, MD  sertraline (ZOLOFT) 50 MG tablet Take 1  tablet (50 mg total) by mouth daily. 01/11/16   Norval Gable, MD    Allergies Patient has no known allergies.    Social History Social History  Substance Use Topics  . Smoking status: Current Every Day Smoker    Packs/day: 0.10    Types: Cigarettes  . Smokeless tobacco: Never Used  . Alcohol use No    Review of Systems Patient denies headaches, rhinorrhea, blurry vision, numbness, shortness of breath, chest pain, edema, cough, abdominal pain, nausea, vomiting, diarrhea, dysuria, fevers, rashes or hallucinations unless otherwise stated above in HPI. ____________________________________________   PHYSICAL EXAM:  VITAL SIGNS: Vitals:   02/12/16 0915  BP: 129/76  Pulse: 84  Resp: 18  Temp: 98.2 F (36.8 C)    Constitutional: Alert and oriented. Well appearing and in no acute distress. Eyes: Conjunctivae are normal. PERRL. EOMI. Head: Atraumatic. Nose: No congestion/rhinnorhea. Mouth/Throat: Mucous membranes are moist.  Oropharynx non-erythematous. Neck: No stridor. Painless ROM. No cervical spine tenderness to palpation Hematological/Lymphatic/Immunilogical: No cervical lymphadenopathy. Cardiovascular: Normal rate, regular rhythm. Grossly normal heart sounds.  Good peripheral circulation. Respiratory: diffuse expiratory wheezing, mild tachypnea, no extremis Gastrointestinal: Soft and nontender. No distention. No abdominal bruits. No CVA tenderness. Genitourinary:  Musculoskeletal: No lower extremity tenderness nor edema.  No joint effusions. Neurologic:  Normal speech and language. No gross focal neurologic deficits are appreciated. No gait instability. Skin:  Skin is warm, dry and intact. No rash noted. Psychiatric: Mood and affect are normal.  Speech and behavior are normal.  ____________________________________________   LABS (all labs ordered are listed, but only abnormal results are displayed)  Results for orders placed or performed during the hospital  encounter of 02/12/16 (from the past 24 hour(s))  Basic metabolic panel     Status: None   Collection Time: 02/12/16  9:28 AM  Result Value Ref Range   Sodium 137 135 - 145 mmol/L   Potassium 3.9 3.5 - 5.1 mmol/L   Chloride 108 101 - 111 mmol/L   CO2 23 22 - 32 mmol/L   Glucose, Bld 99 65 - 99 mg/dL   BUN 13 6 - 20 mg/dL   Creatinine, Ser 0.79 0.44 - 1.00 mg/dL   Calcium 8.9 8.9 - 10.3 mg/dL   GFR calc non Af Amer >60 >60 mL/min   GFR calc Af Amer >60 >60 mL/min   Anion gap 6 5 - 15  CBC     Status: Abnormal   Collection Time: 02/12/16  9:28 AM  Result Value Ref Range   WBC 7.0 3.6 - 11.0 K/uL   RBC 4.40 3.80 - 5.20 MIL/uL   Hemoglobin 11.9 (L) 12.0 - 16.0 g/dL   HCT 36.4 35.0 - 47.0 %   MCV 82.6 80.0 - 100.0 fL   MCH 27.1 26.0 - 34.0 pg   MCHC 32.8 32.0 - 36.0 g/dL   RDW 16.0 (H) 11.5 - 14.5 %   Platelets 297 150 - 440 K/uL  Troponin I     Status: None   Collection Time: 02/12/16  9:28 AM  Result Value Ref Range   Troponin I <0.03 <0.03 ng/mL   ____________________________________________  EKG My review and personal interpretation at Time: 9:29   Indication: sob  Rate: 80  Rhythm: sinus Axis: normal Other: no acute ischemic changes, normal intervals ____________________________________________  RADIOLOGY  I personally reviewed all radiographic images ordered to evaluate for the above acute complaints and reviewed radiology reports and findings.  These findings were personally discussed with the patient.  Please see medical record for radiology report.  ____________________________________________   PROCEDURES  Procedure(s) performed: none Procedures    Critical Care performed: no ____________________________________________   INITIAL IMPRESSION / ASSESSMENT AND PLAN / ED COURSE  Pertinent labs & imaging results that were available during my care of the patient were reviewed by me and considered in my medical decision making (see chart for  details).  DDX: Asthma, copd, CHF, pna, ptx, malignancy, Pe, anemia    Sabrina Holder is a 35 y.o. who presents to the ED with Shortness of breath that appears currently consistent with asthma exacerbation. Chest X ray shows no evidence of pneumonia. Laboratory evaluation is reassuring. EKG shows no evidence of acute ischemia and troponin is negative. The distal neck at rest however. We'll give to nebulizers, steroids and reassess.  The patient will be placed on continuous pulse oximetry and telemetry for monitoring.  Laboratory evaluation will be sent to evaluate for the above complaints.     Clinical Course as of Feb 12 1208  Nancy Fetter Feb 12, 2016  1146 Patient was able to ambulate without any acute distress. She did not saturate. Janett Billow shows evidence of focal pneumonia. Breath sounds and breathing has improved after nebulizer treatment.  She did not have any significant dyspnea on ambulation. Presentation most consistent with asthma exacerbation. Will treat with steroids as well as additional albuterol treatments. However provided referral for outpatient pulmonology and PCP.  Have discussed with the patient and available family  all diagnostics and treatments performed thus far and all questions were answered to the best of my ability. The patient demonstrates understanding and agreement with plan.   [PR]    Clinical Course User Index [PR] Merlyn Lot, MD     ____________________________________________   FINAL CLINICAL IMPRESSION(S) / ED DIAGNOSES  Final diagnoses:  Shortness of breath  Moderate asthma with exacerbation, unspecified whether persistent      NEW MEDICATIONS STARTED DURING THIS VISIT:  New Prescriptions   No medications on file     Note:  This document was prepared using Dragon voice recognition software and may include unintentional dictation errors.    Merlyn Lot, MD 02/12/16 1210

## 2016-02-12 NOTE — ED Notes (Signed)
Pt reports some improvement after nebs.  Continues to have some shortness of breath. Pt is alert and oriented x4.

## 2016-02-12 NOTE — ED Notes (Signed)
Pt in XR. 

## 2016-02-12 NOTE — ED Triage Notes (Addendum)
Patient presents to the ED with cough and congestion with shortness of breath since Tuesday.  Patient states she was diagnosed with pneumonia about 1 month ago but that had mostly resolved.  Patient reports body aches and fever.  Patient reports history of asthma.  Patient states, "I've been having trouble working because I have to lift a lot of heavy things and it's hard for me to breathe."  Patient has to occasionally stop while speaking and catch her breath.  Patient reports using a nebulizer at home.  Patient reports chest pain with cough.

## 2016-02-12 NOTE — ED Notes (Signed)
Patient ambulated on room air, oxygen saturation at 95% is lowest patient dropped to during walk. Pt c/o minimal shortness of exertion, when asked by MD if she wanted another treatment, but declined due jitterness

## 2016-05-16 ENCOUNTER — Ambulatory Visit
Admission: EM | Admit: 2016-05-16 | Discharge: 2016-05-16 | Disposition: A | Discharge status: Home / Self Care | Payer: 59 | Attending: Family Medicine | Admitting: Family Medicine

## 2016-05-16 DIAGNOSIS — J4521 Mild intermittent asthma with (acute) exacerbation: Secondary | ICD-10-CM

## 2016-05-16 DIAGNOSIS — J40 Bronchitis, not specified as acute or chronic: Secondary | ICD-10-CM

## 2016-05-16 MED ORDER — IPRATROPIUM-ALBUTEROL 0.5-2.5 (3) MG/3ML IN SOLN
3.0000 mL | Freq: Once | RESPIRATORY_TRACT | Status: AC
  Administered 2016-05-16: 3 mL via RESPIRATORY_TRACT

## 2016-05-16 MED ORDER — PREDNISONE 20 MG PO TABS: ORAL_TABLET | ORAL | 0 refills | Status: DC

## 2016-05-16 MED ORDER — METHYLPREDNISOLONE SODIUM SUCC 125 MG IJ SOLR
125.0000 mg | Freq: Once | INTRAMUSCULAR | Status: AC
  Administered 2016-05-16: 125 mg via INTRAMUSCULAR

## 2016-05-16 MED ORDER — AZITHROMYCIN 250 MG PO TABS: ORAL_TABLET | ORAL | 0 refills | Status: DC

## 2016-05-16 NOTE — ED Provider Notes (Signed)
[facility]    CSN: [id number] Arrival date & time: 05/16/16  1117     History   Chief Complaint Chief Complaint  Patient presents with  . Shortness of Breath    HPI Sabrina Holder is a 36 y.o. female.   The history is provided by the patient.  Shortness of Breath  Associated symptoms: cough and wheezing   URI  Presenting symptoms: congestion, cough and rhinorrhea   Severity:  Moderate Onset quality:  Sudden Duration:  3 weeks Timing:  Constant Progression:  Worsening Chronicity:  New Relieved by:  Nothing Ineffective treatments:  OTC medications Associated symptoms: wheezing   Risk factors: chronic respiratory disease (asthma)   Risk factors: not elderly, no chronic cardiac disease, no chronic kidney disease, no diabetes mellitus, no immunosuppression, no recent illness, no recent travel and no sick contacts     Past Medical History:  Diagnosis Date  . Anxiety   . Asthma   . Environmental allergies     There are no active problems to display for this patient.   Past Surgical History:  Procedure Laterality Date  . CESAREAN SECTION    . TUBAL LIGATION      OB History    Gravida Para Term Preterm AB Living   5 5 5          SAB TAB Ectopic Multiple Live Births                   Home Medications    Prior to Admission medications   Medication Sig Start Date End Date Taking? Authorizing Provider  albuterol (PROVENTIL HFA;VENTOLIN HFA) 108 (90 Base) MCG/ACT inhaler Inhale 2 puffs into the lungs every 6 (six) hours as needed for wheezing or shortness of breath. 02/12/16  Yes Merlyn Lot, MD  cetirizine (ZYRTEC) 10 MG tablet Take 10 mg by mouth daily.   Yes Historical Provider, MD  fluticasone (FLONASE) 50 MCG/ACT nasal spray Place into both nostrils daily.   Yes Historical Provider, MD  montelukast (SINGULAIR) 10 MG tablet Take 1 tablet (10 mg total) by mouth at bedtime. 03/23/15  Yes Frederich Cha, MD  ALBUTEROL IN Inhale into the  lungs.    Historical Provider, MD  azithromycin (ZITHROMAX Z-PAK) 250 MG tablet 2 tabs po once day 1, then 1 tab po qd for next 4 days 05/16/16   Norval Gable, MD  fexofenadine-pseudoephedrine (ALLEGRA-D ALLERGY & CONGESTION) 180-240 MG 24 hr tablet Take 1 tablet by mouth daily. 03/23/15   Frederich Cha, MD  Montelukast Sodium (SINGULAIR PO) Take by mouth.    Historical Provider, MD  predniSONE (DELTASONE) 20 MG tablet 3 tabs po qd for 2 days, then 2 tabs po qd for 3 days, then 1 tab po qd for 3 days, then half a tab po qd for 2 days 05/16/16   Norval Gable, MD  sertraline (ZOLOFT) 50 MG tablet Take 1 tablet (50 mg total) by mouth daily. 01/11/16   Norval Gable, MD    Family History History reviewed. No pertinent family history.  Social History Social History  Substance Use Topics  . Smoking status: Former Smoker    Types: Cigarettes  . Smokeless tobacco: Never Used  . Alcohol use No     Allergies   Patient has no known allergies.   Review of Systems Review of Systems  HENT: Positive for congestion and rhinorrhea.   Respiratory: Positive for cough, shortness of breath and wheezing.      Physical Exam Triage Vital  Signs ED Triage Vitals  Enc Vitals Group     BP 05/16/16 1135 125/75     Pulse Rate 05/16/16 1135 88     Resp 05/16/16 1135 19     Temp 05/16/16 1135 98.6 F (37 C)     Temp Source 05/16/16 1135 Oral     SpO2 05/16/16 1135 96 %     Weight 05/16/16 1131 228 lb (103.4 kg)     Height 05/16/16 1131 5\' 4"  (1.626 m)     Head Circumference --      Peak Flow --      Pain Score 05/16/16 1134 7     Pain Loc --      Pain Edu? --      Excl. in [employer]? --    No data found.   Updated Vital Signs BP 125/75 (BP Location: Left Arm)   Pulse 88   Temp 98.6 F (37 C) (Oral)   Resp 19   Ht 5\' 4"  (1.626 m)   Wt 228 lb (103.4 kg)   LMP 05/16/2016   SpO2 96%   BMI 39.14 kg/m   Visual Acuity Right Eye Distance:   Left Eye Distance:   Bilateral Distance:    Right  Eye Near:   Left Eye Near:    Bilateral Near:     Physical Exam  Constitutional: She appears well-developed and well-nourished. No distress.  HENT:  Head: Normocephalic and atraumatic.  Right Ear: Tympanic membrane, external ear and ear canal normal.  Left Ear: Tympanic membrane, external ear and ear canal normal.  Nose: Mucosal edema and rhinorrhea present. No nose lacerations, sinus tenderness, nasal deformity, septal deviation or nasal septal hematoma. No epistaxis.  No foreign bodies. Right sinus exhibits no maxillary sinus tenderness and no frontal sinus tenderness. Left sinus exhibits no maxillary sinus tenderness and no frontal sinus tenderness.  Mouth/Throat: Uvula is midline, oropharynx is clear and moist and mucous membranes are normal. No oropharyngeal exudate.  Eyes: Conjunctivae and EOM are normal. Pupils are equal, round, and reactive to light. Right eye exhibits no discharge. Left eye exhibits no discharge. No scleral icterus.  Neck: Normal range of motion. Neck supple. No thyromegaly present.  Cardiovascular: Normal rate, regular rhythm and normal heart sounds.   Pulmonary/Chest: Effort normal. No respiratory distress. She has wheezes (diffusely, bilaterally). She has no rales.  Diffuse rhonchi  Lymphadenopathy:    She has no cervical adenopathy.  Skin: She is not diaphoretic.  Nursing note and vitals reviewed.    UC Treatments / Results  Labs (all labs ordered are listed, but only abnormal results are displayed) Labs Reviewed - No data to display  EKG  EKG Interpretation None       Radiology No results found.  Procedures Procedures (including critical care time)  Medications Ordered in UC Medications  ipratropium-albuterol (DUONEB) 0.5-2.5 (3) MG/3ML nebulizer solution 3 mL (3 mLs Nebulization Given 05/16/16 1215)  methylPREDNISolone sodium succinate (SOLU-MEDROL) 125 mg/2 mL injection 125 mg (125 mg Intramuscular Given 05/16/16 1214)     Initial  Impression / Assessment and Plan / UC Course  I have reviewed the triage vital signs and the nursing notes.  Pertinent labs & imaging results that were available during my care of the patient were reviewed by me and considered in my medical decision making (see chart for details).       Final Clinical Impressions(s) / UC Diagnoses   Final diagnoses:  Exacerbation of intermittent asthma, unspecified asthma severity  Bronchitis  New Prescriptions New Prescriptions   AZITHROMYCIN (ZITHROMAX Z-PAK) 250 MG TABLET    2 tabs po once day 1, then 1 tab po qd for next 4 days   PREDNISONE (DELTASONE) 20 MG TABLET    3 tabs po qd for 2 days, then 2 tabs po qd for 3 days, then 1 tab po qd for 3 days, then half a tab po qd for 2 days   1. diagnosis reviewed with patient 2. rx as per orders above; reviewed possible side effects, interactions, risks and benefits  3. Patient given solumedrol 125mg  im x 1 and duoneb x 1 with improvement of symptoms 4.Recommend supportive treatment with increased fluids 5. Follow-up prn if symptoms worsen or don't improve   Norval Gable, MD 05/16/16 1229

## 2016-05-16 NOTE — ED Triage Notes (Signed)
Patient complains of shortness of breath, cough-productive. Patient states that symptoms started 3 weeks ago. Patient reports that she has asthma and feels like she is having trouble managing it currently. Patient reports that she has been using inhalers as rxed.

## 2016-07-11 ENCOUNTER — Encounter: Payer: Self-pay | Admitting: *Deleted

## 2016-07-11 ENCOUNTER — Ambulatory Visit
Admission: EM | Admit: 2016-07-11 | Discharge: 2016-07-11 | Disposition: A | Discharge status: Home / Self Care | Payer: 59 | Attending: Family Medicine | Admitting: Family Medicine

## 2016-07-11 DIAGNOSIS — R05 Cough: Secondary | ICD-10-CM

## 2016-07-11 DIAGNOSIS — J4521 Mild intermittent asthma with (acute) exacerbation: Secondary | ICD-10-CM | POA: Diagnosis not present

## 2016-07-11 DIAGNOSIS — R0602 Shortness of breath: Secondary | ICD-10-CM

## 2016-07-11 MED ORDER — PREDNISONE 20 MG PO TABS: ORAL_TABLET | ORAL | 0 refills | Status: DC

## 2016-07-11 MED ORDER — MONTELUKAST SODIUM 10 MG PO TABS: 10.0000 mg | ORAL_TABLET | Freq: Every day | ORAL | 0 refills | Status: DC

## 2016-07-11 MED ORDER — IPRATROPIUM-ALBUTEROL 0.5-2.5 (3) MG/3ML IN SOLN
3.0000 mL | Freq: Once | RESPIRATORY_TRACT | Status: AC
  Administered 2016-07-11: 3 mL via RESPIRATORY_TRACT

## 2016-07-11 MED ORDER — IPRATROPIUM-ALBUTEROL 0.5-2.5 (3) MG/3ML IN SOLN: 3.0000 mL | Freq: Four times a day (QID) | RESPIRATORY_TRACT | 0 refills | Status: DC | PRN

## 2016-07-11 MED ORDER — METHYLPREDNISOLONE SODIUM SUCC 125 MG IJ SOLR
125.0000 mg | Freq: Once | INTRAMUSCULAR | Status: AC
  Administered 2016-07-11: 125 mg via INTRAMUSCULAR

## 2016-07-11 NOTE — ED Provider Notes (Signed)
[facility]    CSN: [id number] Arrival date & time: 07/11/16  1439     History   Chief Complaint Chief Complaint  Patient presents with  . Shortness of Breath  . Cough    HPI Sabrina Holder is a 36 y.o. female.   The history is provided by the patient.  Shortness of Breath  Severity:  Moderate Onset quality:  Gradual Duration:  3 days Timing:  Constant Progression:  Worsening Chronicity:  New Context: known allergens and pollens   Context: not activity, not animal exposure, not emotional upset, not fumes, not occupational exposure, not smoke exposure, not strong odors, not URI and not weather changes   Relieved by:  Nothing Ineffective treatments:  Inhaler Associated symptoms: cough and wheezing   Associated symptoms: no abdominal pain, no chest pain, no claudication, no diaphoresis, no ear pain, no fever, no headaches, no hemoptysis, no neck pain, no PND, no rash, no sore throat, no sputum production, no syncope, no swollen glands and no vomiting   Risk factors: obesity   Risk factors: no recent alcohol use, no family hx of DVT, no hx of cancer, no hx of PE/DVT, no oral contraceptive use, no prolonged immobilization, no recent surgery and no tobacco use   Cough  Associated symptoms: shortness of breath and wheezing   Associated symptoms: no chest pain, no diaphoresis, no ear pain, no fever, no headaches, no rash and no sore throat     Past Medical History:  Diagnosis Date  . Anxiety   . Asthma   . Environmental allergies     There are no active problems to display for this patient.   Past Surgical History:  Procedure Laterality Date  . CESAREAN SECTION    . TUBAL LIGATION      OB History    Gravida Para Term Preterm AB Living   5 5 5          SAB TAB Ectopic Multiple Live Births                   Home Medications    Prior to Admission medications   Medication Sig Start Date End Date Taking? Authorizing Provider  albuterol  (PROVENTIL HFA;VENTOLIN HFA) 108 (90 Base) MCG/ACT inhaler Inhale 2 puffs into the lungs every 6 (six) hours as needed for wheezing or shortness of breath. 02/12/16  Yes Merlyn Lot, MD  cetirizine (ZYRTEC) 10 MG tablet Take 10 mg by mouth daily.   Yes Historical Provider, MD  fexofenadine-pseudoephedrine (ALLEGRA-D ALLERGY & CONGESTION) 180-240 MG 24 hr tablet Take 1 tablet by mouth daily. 03/23/15  Yes Frederich Cha, MD  fluticasone [facility]) 50 MCG/ACT nasal spray Place into both nostrils daily.   Yes Historical Provider, MD  ALBUTEROL IN Inhale into the lungs.    Historical Provider, MD  azithromycin (ZITHROMAX Z-PAK) 250 MG tablet 2 tabs po once day 1, then 1 tab po qd for next 4 days 05/16/16   Norval Gable, MD  ipratropium-albuterol (DUONEB) 0.5-2.5 (3) MG/3ML SOLN Take 3 mLs by nebulization every 6 (six) hours as needed. 07/11/16   Norval Gable, MD  montelukast (SINGULAIR) 10 MG tablet Take 1 tablet (10 mg total) by mouth at bedtime. 07/11/16   Norval Gable, MD  predniSONE (DELTASONE) 20 MG tablet 3 tabs po qd for 2 days, then 2 tabs po qd for 3 days, then 1 tab po qd for 3 days, then half a tab po qd for 2 days 07/11/16   [facility]  Leisel Pinette, MD  sertraline (ZOLOFT) 50 MG tablet Take 1 tablet (50 mg total) by mouth daily. 01/11/16   Norval Gable, MD    Family History History reviewed. No pertinent family history.  Social History Social History  Substance Use Topics  . Smoking status: Former Smoker    Types: Cigarettes  . Smokeless tobacco: Never Used  . Alcohol use No     Allergies   Patient has no known allergies.   Review of Systems Review of Systems  Constitutional: Negative for diaphoresis and fever.  HENT: Negative for ear pain and sore throat.   Respiratory: Positive for cough, shortness of breath and wheezing. Negative for hemoptysis and sputum production.   Cardiovascular: Negative for chest pain, claudication, syncope and PND.  Gastrointestinal: Negative for  abdominal pain and vomiting.  Musculoskeletal: Negative for neck pain.  Skin: Negative for rash.  Neurological: Negative for headaches.     Physical Exam Triage Vital Signs ED Triage Vitals  Enc Vitals Group     BP 07/11/16 1450 129/77     Pulse Rate 07/11/16 1450 96     Resp 07/11/16 1450 16     Temp 07/11/16 1450 98.5 F (36.9 C)     Temp Source 07/11/16 1450 Oral     SpO2 07/11/16 1450 96 %     Weight 07/11/16 1452 230 lb (104.3 kg)     Height 07/11/16 1452 5\' 4"  (1.626 m)     Head Circumference --      Peak Flow --      Pain Score --      Pain Loc --      Pain Edu? --      Excl. in [place]? --    No data found.   Updated Vital Signs BP 129/77 (BP Location: Right Arm)   Pulse 96   Temp 98.5 F (36.9 C) (Oral)   Resp 16   Ht 5\' 4"  (1.626 m)   Wt 230 lb (104.3 kg)   LMP 06/09/2016 (Approximate)   SpO2 96%   BMI 39.48 kg/m   Visual Acuity Right Eye Distance:   Left Eye Distance:   Bilateral Distance:    Right Eye Near:   Left Eye Near:    Bilateral Near:     Physical Exam  Constitutional: She appears well-developed and well-nourished. No distress.  HENT:  Head: Normocephalic and atraumatic.  Right Ear: Tympanic membrane, external ear and ear canal normal.  Left Ear: Tympanic membrane, external ear and ear canal normal.  Nose: No mucosal edema, rhinorrhea, nose lacerations, sinus tenderness, nasal deformity, septal deviation or nasal septal hematoma. No epistaxis.  No foreign bodies. Right sinus exhibits no maxillary sinus tenderness and no frontal sinus tenderness. Left sinus exhibits no maxillary sinus tenderness and no frontal sinus tenderness.  Mouth/Throat: Uvula is midline, oropharynx is clear and moist and mucous membranes are normal. No oropharyngeal exudate.  Eyes: Conjunctivae and EOM are normal. Pupils are equal, round, and reactive to light. Right eye exhibits no discharge. Left eye exhibits no discharge. No scleral icterus.  Neck: Normal range of  motion. Neck supple. No thyromegaly present.  Cardiovascular: Normal rate, regular rhythm and normal heart sounds.   Pulmonary/Chest: Effort normal. No respiratory distress. She has wheezes. She has no rales.  Lymphadenopathy:    She has no cervical adenopathy.  Skin: She is not diaphoretic.  Nursing note and vitals reviewed.    UC Treatments / Results  Labs (all labs ordered are listed, but only  abnormal results are displayed) Labs Reviewed - No data to display  EKG  EKG Interpretation None       Radiology No results found.  Procedures Procedures (including critical care time)  Medications Ordered in UC Medications  ipratropium-albuterol (DUONEB) 0.5-2.5 (3) MG/3ML nebulizer solution 3 mL (3 mLs Nebulization Given 07/11/16 1536)  methylPREDNISolone sodium succinate (SOLU-MEDROL) 125 mg/2 mL injection 125 mg (125 mg Intramuscular Given 07/11/16 1535)  ipratropium-albuterol (DUONEB) 0.5-2.5 (3) MG/3ML nebulizer solution 3 mL (3 mLs Nebulization Given 07/11/16 1611)     Initial Impression / Assessment and Plan / UC Course  I have reviewed the triage vital signs and the nursing notes.  Pertinent labs & imaging results that were available during my care of the patient were reviewed by me and considered in my medical decision making (see chart for details).       Final Clinical Impressions(s) / UC Diagnoses   Final diagnoses:  Mild intermittent asthma with exacerbation    New Prescriptions Discharge Medication List as of 07/11/2016  4:08 PM    START taking these medications   Details  ipratropium-albuterol (DUONEB) 0.5-2.5 (3) MG/3ML SOLN Take 3 mLs by nebulization every 6 (six) hours as needed., Starting Wed 07/11/2016, Normal       1. diagnosis reviewed with patient 2. Patient given duoneb tx x 2 and solumedrol 125mg  IM x 1 with improvement of symptoms 3. rx as per orders above; reviewed possible side effects, interactions, risks and benefits; rx for prednisone  and singulair as per orders  4. Follow-up with new PCP  5. f/u prn if symptoms worsen or don't improve   Norval Gable, MD 07/11/16 1653

## 2016-07-11 NOTE — ED Triage Notes (Signed)
Pt states 2 "asthma attacks" today. C/o DOE and productive cough- clear. Hx of seasonal asthma.

## 2016-08-16 ENCOUNTER — Ambulatory Visit
Admission: EM | Admit: 2016-08-16 | Discharge: 2016-08-16 | Disposition: A | Discharge status: Home / Self Care | Payer: 59 | Attending: Family Medicine | Admitting: Family Medicine

## 2016-08-16 DIAGNOSIS — R062 Wheezing: Secondary | ICD-10-CM | POA: Diagnosis not present

## 2016-08-16 DIAGNOSIS — J9801 Acute bronchospasm: Secondary | ICD-10-CM | POA: Diagnosis not present

## 2016-08-16 DIAGNOSIS — R0602 Shortness of breath: Secondary | ICD-10-CM

## 2016-08-16 DIAGNOSIS — J301 Allergic rhinitis due to pollen: Secondary | ICD-10-CM

## 2016-08-16 DIAGNOSIS — J4521 Mild intermittent asthma with (acute) exacerbation: Secondary | ICD-10-CM

## 2016-08-16 MED ORDER — FEXOFENADINE-PSEUDOEPHED ER 180-240 MG PO TB24: 1.0000 | ORAL_TABLET | Freq: Every day | ORAL | 1 refills | Status: DC

## 2016-08-16 MED ORDER — METHYLPREDNISOLONE SODIUM SUCC 125 MG IJ SOLR
125.0000 mg | Freq: Once | INTRAMUSCULAR | Status: AC
  Administered 2016-08-16: 125 mg via INTRAMUSCULAR

## 2016-08-16 MED ORDER — FLUTICASONE PROPIONATE 50 MCG/ACT NA SUSP: 2.0000 | Freq: Every day | NASAL | 1 refills | Status: DC

## 2016-08-16 MED ORDER — PREDNISONE 10 MG (21) PO TBPK: ORAL_TABLET | ORAL | 0 refills | Status: DC

## 2016-08-16 MED ORDER — ALBUTEROL SULFATE HFA 108 (90 BASE) MCG/ACT IN AERS: 2.0000 | INHALATION_SPRAY | Freq: Four times a day (QID) | RESPIRATORY_TRACT | 1 refills | Status: DC | PRN

## 2016-08-16 MED ORDER — MONTELUKAST SODIUM 10 MG PO TABS: 10.0000 mg | ORAL_TABLET | Freq: Every day | ORAL | 1 refills | Status: DC

## 2016-08-16 MED ORDER — IPRATROPIUM-ALBUTEROL 0.5-2.5 (3) MG/3ML IN SOLN
3.0000 mL | Freq: Once | RESPIRATORY_TRACT | Status: AC
  Administered 2016-08-16: 3 mL via RESPIRATORY_TRACT

## 2016-08-16 MED ORDER — IPRATROPIUM-ALBUTEROL 0.5-2.5 (3) MG/3ML IN SOLN: 3.0000 mL | Freq: Four times a day (QID) | RESPIRATORY_TRACT | 1 refills | Status: DC | PRN

## 2016-08-16 NOTE — ED Triage Notes (Signed)
Pt has asthma and she is having a hard time controlling it at night, due to congestion. Her meds are expiring and she needs new ones. She is having a hard time sleeping. She also c/o heartburn.

## 2016-08-16 NOTE — ED Provider Notes (Signed)
[facility]    CSN: [id number] Arrival date & time: 08/16/16  1542     History   Chief Complaint Chief Complaint  Patient presents with  . URI    HPI Sabrina Holder is a 36 y.o. female.   Patient is a 36 year old African American female who's had increased shortness of breath bronchospasm and wheezing. She states that she has a history of asthma in the wintertime she has cold induced bronchospasm in the late summer she deals with the weed allergens. This spring she is dealing with pollen and other hayfever allergies. She states that she is in the middle of switching over to providers she is going to do primary care the first appointment is in the end of August. She is running low on multiple medications such as her Singulair which she takes every day her steroid nasal inhaler or albuterol and her DuoNeb nebulizer solutions. She reports increased wheezing shortness of breath. She reports increased phlegm in her throat or mouth along with the wheezing. She's had a C-section tubal ligation she has a history of anxiety about allergens and asthma. She is a former smoker. No known drug allergies   The history is provided by the patient. No language interpreter was used.  URI  Presenting symptoms: congestion, cough and rhinorrhea   Presenting symptoms: no fever   Congestion:    Location:  Nasal and chest   Interferes with sleep: yes     Interferes with eating/drinking: no   Severity:  Moderate Onset quality:  Sudden Timing:  Constant Progression:  Worsening Chronicity:  Recurrent Relieved by:  Nothing Worsened by:  Nothing Ineffective treatments:  Decongestant, nebulizer treatments, prescription medications and OTC medications   Past Medical History:  Diagnosis Date  . Anxiety   . Asthma   . Environmental allergies     There are no active problems to display for this patient.   Past Surgical History:  Procedure Laterality Date  . CESAREAN SECTION    .  TUBAL LIGATION      OB History    Gravida Para Term Preterm AB Living   5 5 5          SAB TAB Ectopic Multiple Live Births                   Home Medications    Prior to Admission medications   Medication Sig Start Date End Date Taking? Authorizing Provider  sertraline (ZOLOFT) 50 MG tablet Take 1 tablet (50 mg total) by mouth daily. 01/11/16  Yes Norval Gable, MD  albuterol (PROVENTIL HFA;VENTOLIN HFA) 108 (90 Base) MCG/ACT inhaler Inhale 2 puffs into the lungs every 6 (six) hours as needed for wheezing or shortness of breath. 08/16/16   Frederich Cha, MD  ALBUTEROL IN Inhale into the lungs.    [provider]  fexofenadine-pseudoephedrine (ALLEGRA-D ALLERGY & CONGESTION) 180-240 MG 24 hr tablet Take 1 tablet by mouth daily. 08/16/16   Frederich Cha, MD  fluticasone (FLONASE) 50 MCG/ACT nasal spray Place 2 sprays into both nostrils daily. 08/16/16   Frederich Cha, MD  ipratropium-albuterol (DUONEB) 0.5-2.5 (3) MG/3ML SOLN Take 3 mLs by nebulization every 6 (six) hours as needed. 08/16/16   Frederich Cha, MD  montelukast (SINGULAIR) 10 MG tablet Take 1 tablet (10 mg total) by mouth at bedtime. 08/16/16   Frederich Cha, MD  predniSONE (STERAPRED UNI-PAK 21 TAB) 10 MG (21) TBPK tablet 6 tabs day 1 and 2, 5 tabs day 3 and 4,  4 tabs day 5 and 6, 3 tabs day 7 and 8, 2 tabs day 9 and 10, 1 tab day 11 and 12. Take orally 08/16/16   Frederich Cha, MD    Family History History reviewed. No pertinent family history.  Social History Social History  Substance Use Topics  . Smoking status: Former Smoker    Types: Cigarettes  . Smokeless tobacco: Never Used  . Alcohol use No     Allergies   Patient has no known allergies.   Review of Systems Review of Systems  Constitutional: Negative for fever.  HENT: Positive for congestion and rhinorrhea.   Respiratory: Positive for cough.   All other systems reviewed and are negative.    Physical Exam Triage Vital Signs ED Triage Vitals    Enc Vitals Group     BP 08/16/16 1559 127/60     Pulse Rate 08/16/16 1559 85     Resp 08/16/16 1559 18     Temp 08/16/16 1559 98 F (36.7 C)     Temp Source 08/16/16 1559 Oral     SpO2 08/16/16 1559 100 %     Weight 08/16/16 1558 230 lb (104.3 kg)     Height 08/16/16 1558 5\' 4"  (1.626 m)     Head Circumference --      Peak Flow --      Pain Score 08/16/16 1558 0     Pain Loc --      Pain Edu? --      Excl. in [place]? --    No data found.   Updated Vital Signs BP 127/60 (BP Location: Left Arm)   Pulse 85   Temp 98 F (36.7 C) (Oral)   Resp 18   Ht 5\' 4"  (1.626 m)   Wt 230 lb (104.3 kg)   SpO2 100%   BMI 39.48 kg/m   Visual Acuity Right Eye Distance:   Left Eye Distance:   Bilateral Distance:    Right Eye Near:   Left Eye Near:    Bilateral Near:     Physical Exam  Constitutional: She is oriented to person, place, and time. She appears well-developed and well-nourished. No distress.  HENT:  Head: Normocephalic and atraumatic.  Right Ear: Hearing, tympanic membrane, external ear and ear canal normal.  Left Ear: Hearing, tympanic membrane, external ear and ear canal normal.  Nose: Mucosal edema and rhinorrhea present.  Mouth/Throat: Uvula is midline, oropharynx is clear and moist and mucous membranes are normal.  Eyes: Lids are normal.  Neck: Trachea normal and full passive range of motion without pain. No thyroid mass and no thyromegaly present.  Cardiovascular: Normal rate and regular rhythm.   Pulmonary/Chest: She has wheezes.  Musculoskeletal: Normal range of motion.  Neurological: She is alert and oriented to person, place, and time.  Skin: Skin is warm. She is not diaphoretic.  Psychiatric: She has a normal mood and affect.  Vitals reviewed.    UC Treatments / Results  Labs (all labs ordered are listed, but only abnormal results are displayed) Labs Reviewed - No data to display  EKG  EKG Interpretation None       Radiology No results  found.  Procedures Procedures (including critical care time)  Medications Ordered in UC Medications  methylPREDNISolone sodium succinate (SOLU-MEDROL) 125 mg/2 mL injection 125 mg (125 mg Intramuscular Given 08/16/16 1627)  ipratropium-albuterol (DUONEB) 0.5-2.5 (3) MG/3ML nebulizer solution 3 mL (3 mLs Nebulization Given 08/16/16 1627)     Initial Impression /  Assessment and Plan / UC Course  I have reviewed the triage vital signs and the nursing notes.  Pertinent labs & imaging results that were available during my care of the patient were reviewed by me and considered in my medical decision making (see chart for details).     Patient was given DuoNeb treatment for breathing which seems to help. She is almost out of multiple medications and why explained to her UC do not give refills because she does have a provider lined up but appointments in August will give one refill. Will also place her on a 12 day course of prednisone after discussion with her and give her 125 mg Solu-Medrol IM offered her a work note she declined.  Final Clinical Impressions(s) / UC Diagnoses   Final diagnoses:  Exacerbation of intermittent asthma, unspecified asthma severity  Seasonal allergic rhinitis due to pollen    New Prescriptions Discharge Medication List as of 08/16/2016  4:31 PM    START taking these medications   Details  predniSONE (STERAPRED UNI-PAK 21 TAB) 10 MG (21) TBPK tablet 6 tabs day 1 and 2, 5 tabs day 3 and 4, 4 tabs day 5 and 6, 3 tabs day 7 and 8, 2 tabs day 9 and 10, 1 tab day 11 and 12. Take orally, Normal        Note: This dictation was prepared with Dragon dictation along with smaller phrase technology. Any transcriptional errors that result from this process are unintentional.   Frederich Cha, MD 08/16/16 [phone]

## 2017-02-24 ENCOUNTER — Encounter: Payer: Self-pay | Admitting: Gynecology

## 2017-02-24 ENCOUNTER — Ambulatory Visit
Admission: EM | Admit: 2017-02-24 | Discharge: 2017-02-24 | Disposition: A | Discharge status: Home / Self Care | Payer: 59 | Attending: Emergency Medicine | Admitting: Emergency Medicine

## 2017-02-24 ENCOUNTER — Other Ambulatory Visit: Payer: Self-pay

## 2017-02-24 DIAGNOSIS — R0982 Postnasal drip: Secondary | ICD-10-CM

## 2017-02-24 DIAGNOSIS — J45901 Unspecified asthma with (acute) exacerbation: Secondary | ICD-10-CM

## 2017-02-24 HISTORY — DX: Disorder of kidney and ureter, unspecified: N28.9

## 2017-02-24 MED ORDER — FEXOFENADINE-PSEUDOEPHED ER 180-240 MG PO TB24: 1.0000 | ORAL_TABLET | Freq: Every day | ORAL | 1 refills | Status: DC

## 2017-02-24 MED ORDER — IPRATROPIUM-ALBUTEROL 0.5-2.5 (3) MG/3ML IN SOLN: 3.0000 mL | Freq: Four times a day (QID) | RESPIRATORY_TRACT | 0 refills | Status: DC | PRN

## 2017-02-24 MED ORDER — DOXYCYCLINE HYCLATE 100 MG PO CAPS: 100.0000 mg | ORAL_CAPSULE | Freq: Two times a day (BID) | ORAL | 0 refills | Status: AC

## 2017-02-24 MED ORDER — IPRATROPIUM BROMIDE 0.06 % NA SOLN: 2.0000 | Freq: Four times a day (QID) | NASAL | 0 refills | Status: DC

## 2017-02-24 MED ORDER — PREDNISONE 10 MG (21) PO TBPK: ORAL_TABLET | ORAL | 0 refills | Status: DC

## 2017-02-24 MED ORDER — ALBUTEROL SULFATE HFA 108 (90 BASE) MCG/ACT IN AERS: 2.0000 | INHALATION_SPRAY | RESPIRATORY_TRACT | 1 refills | Status: DC | PRN

## 2017-02-24 NOTE — Discharge Instructions (Signed)
Take the medication as written. Return to the ER if you get worse, have a fever >100.4, if you are still needing your albuterol inhaler or nebulizer every hour despite being on the steroids, or for any concerns. Use a neti pot or the NeilMed sinus rinse as often as you want to to reduce nasal congestion. Follow the directions on the box.   Go to www.goodrx.com to look up your medications. This will give you a list of where you can find your prescriptions at the most affordable prices. Or you can ask the pharmacist what the cash price is. This is frequently cheaper than going through insurance.

## 2017-02-24 NOTE — ED Provider Notes (Signed)
HPI  SUBJECTIVE:  Sabrina Holder is a 36 y.o. female who presents with 2 weeks of postnasal drip, cough productive of whitish phlegm, wheezing, shortness of breath, shortness of breath with exertion to several hundred feet and fatigue.  She reports diffuse chest tightness consistent with previous asthma exacerbations.  She also reports an occasional headache located behind her eyes.  No nasal congestion, rhinorrhea, fevers, chest pain.  No sinus pain or pressure, upper dental pain.  She has tried Mucinex, Allegra-D, Flonase, humidifier, and states that she has run out of her inhaler and her DuoNebs.  States that she went through a whole box of DuoNeb nebulizer treatments within a week.  She has been using bronchodilator every hour.  She states that the duo nebs, albuterol and Allegra-D help.  Symptoms are worse with lying down.  She has a past medical history of seasonal allergies, asthma.  No history of diabetes, hypertension.  LMP: Now.  PMD: [facility].    Past Medical History:  Diagnosis Date  . Anxiety   . Asthma   . Environmental allergies   . Renal disorder     Past Surgical History:  Procedure Laterality Date  . CESAREAN SECTION    . TUBAL LIGATION      No family history on file.  Social History   Tobacco Use  . Smoking status: Former Smoker    Types: Cigarettes  . Smokeless tobacco: Never Used  Substance Use Topics  . Alcohol use: No  . Drug use: No    No current facility-administered medications for this encounter.   Current Outpatient Medications:  .  montelukast (SINGULAIR) 10 MG tablet, Take 1 tablet (10 mg total) by mouth at bedtime., Disp: 30 tablet, Rfl: 1 .  albuterol (PROVENTIL HFA;VENTOLIN HFA) 108 (90 Base) MCG/ACT inhaler, Inhale 2 puffs into the lungs every 4 (four) hours as needed for wheezing or shortness of breath., Disp: 1 Inhaler, Rfl: 1 .  doxycycline (VIBRAMYCIN) 100 MG capsule, Take 1 capsule (100 mg total) by mouth 2 (two)  times daily for 10 days., Disp: 20 capsule, Rfl: 0 .  fexofenadine-pseudoephedrine (ALLEGRA-D ALLERGY & CONGESTION) 180-240 MG 24 hr tablet, Take 1 tablet by mouth daily., Disp: 30 tablet, Rfl: 1 .  ipratropium (ATROVENT) 0.06 % nasal spray, Place 2 sprays into both nostrils 4 (four) times daily. 3-4 times/ day, Disp: 15 mL, Rfl: 0 .  ipratropium-albuterol (DUONEB) 0.5-2.5 (3) MG/3ML SOLN, Take 3 mLs by nebulization every 6 (six) hours as needed., Disp: 360 mL, Rfl: 0 .  predniSONE (STERAPRED UNI-PAK 21 TAB) 10 MG (21) TBPK tablet, Dispense one 6 day pack. Take as directed with food., Disp: 21 tablet, Rfl: 0 .  sertraline (ZOLOFT) 50 MG tablet, Take 1 tablet (50 mg total) by mouth daily., Disp: 20 tablet, Rfl: 0  No Known Allergies   ROS  As noted in HPI.   Physical Exam  BP 124/75 (BP Location: Left Arm)   Pulse 90   Temp 98.6 F (37 C) (Oral)   Resp 16   Ht 5\' 4"  (1.626 m)   Wt 224 lb (101.6 kg)   LMP 02/21/2017   SpO2 96%   BMI 38.45 kg/m   Constitutional: Well developed, well nourished, no acute distress Eyes: PERRL, EOMI, conjunctiva normal bilaterally HENT: Normocephalic, atraumatic,mucus membranes moist.  Scant nasal congestion.  Erythematous turbinates.  No sinus tenderness.  Positive cobblestoning and postnasal drip. Respiratory: Good air movement, loud wheezing throughout all lung fields.  No chest wall tenderness Cardiovascular: Normal rate and rhythm, no murmurs, no gallops, no rubs GI: nondistended, skin: No rash, skin intact Musculoskeletal: no deformities Neurologic: Alert & oriented x 3, CN II-XII grossly intact, no motor deficits, sensation grossly intact Psychiatric: Speech and behavior appropriate   ED Course   Medications - No data to display  No orders of the defined types were placed in this encounter.  No results found for this or any previous visit (from the past 24 hour(s)). No results found.  ED Clinical Impression  Postnasal drip  Mild  asthma with acute exacerbation, unspecified whether persistent   ED Assessment/Plan  Presentation consistent with an asthma exacerbation due to allergies.   she is in no respiratory distress and is speaking in full sentences.  Plan to send home with a 6 day prednisone taper, will refill her DuoNeb prescription, albuterol inhaler.  States that she has a spacer.  We will try Atrovent nasal spray since she states the Flonase is not working for her, and will refill her Allegra-D.  Advised saline nasal irrigation, and will send home with doxycycline which will cover sinusitis in addition to a pneumonia.  Chest x-ray was deferred today because she has had no history of fevers and because it would not change management.  She will follow-up with her primary care physician in several days to make sure that she is getting better, to the ER if she gets worse in any way.  Discussed MDM, plan and followup with patient Discussed sn/sx that should prompt return to the ED. patient agrees with plan.   Meds ordered this encounter  Medications  . fexofenadine-pseudoephedrine (ALLEGRA-D ALLERGY & CONGESTION) 180-240 MG 24 hr tablet    Sig: Take 1 tablet by mouth daily.    Dispense:  30 tablet    Refill:  1  . predniSONE (STERAPRED UNI-PAK 21 TAB) 10 MG (21) TBPK tablet    Sig: Dispense one 6 day pack. Take as directed with food.    Dispense:  21 tablet    Refill:  0  . albuterol (PROVENTIL HFA;VENTOLIN HFA) 108 (90 Base) MCG/ACT inhaler    Sig: Inhale 2 puffs into the lungs every 4 (four) hours as needed for wheezing or shortness of breath.    Dispense:  1 Inhaler    Refill:  1  . ipratropium-albuterol (DUONEB) 0.5-2.5 (3) MG/3ML SOLN    Sig: Take 3 mLs by nebulization every 6 (six) hours as needed.    Dispense:  360 mL    Refill:  0  . ipratropium (ATROVENT) 0.06 % nasal spray    Sig: Place 2 sprays into both nostrils 4 (four) times daily. 3-4 times/ day    Dispense:  15 mL    Refill:  0  . doxycycline  (VIBRAMYCIN) 100 MG capsule    Sig: Take 1 capsule (100 mg total) by mouth 2 (two) times daily for 10 days.    Dispense:  20 capsule    Refill:  0    *This clinic note was created using Lobbyist. Therefore, there may be occasional mistakes despite careful proofreading.  ?   Melynda Ripple, MD 02/24/17 1404

## 2017-02-24 NOTE — ED Triage Notes (Signed)
Patient clo sinus drainage / cough over 1 week. Per patient most time given steroid to help with symptoms.

## 2017-03-09 ENCOUNTER — Ambulatory Visit
Admission: EM | Admit: 2017-03-09 | Discharge: 2017-03-09 | Disposition: A | Discharge status: Home / Self Care | Payer: 59 | Attending: Emergency Medicine | Admitting: Emergency Medicine

## 2017-03-09 ENCOUNTER — Ambulatory Visit (INDEPENDENT_AMBULATORY_CARE_PROVIDER_SITE_OTHER): Payer: 59

## 2017-03-09 ENCOUNTER — Other Ambulatory Visit: Payer: Self-pay

## 2017-03-09 DIAGNOSIS — R05 Cough: Secondary | ICD-10-CM

## 2017-03-09 DIAGNOSIS — J4531 Mild persistent asthma with (acute) exacerbation: Secondary | ICD-10-CM | POA: Diagnosis not present

## 2017-03-09 DIAGNOSIS — R062 Wheezing: Secondary | ICD-10-CM | POA: Diagnosis not present

## 2017-03-09 DIAGNOSIS — R0601 Orthopnea: Secondary | ICD-10-CM | POA: Diagnosis not present

## 2017-03-09 LAB — CBC WITH DIFFERENTIAL/PLATELET
Basophils Absolute: 0.1 10*3/uL (ref 0–0.1)
Basophils Relative: 1 %
EOS ABS: 0.8 10*3/uL — AB (ref 0–0.7)
EOS PCT: 11 %
HCT: 30.7 % — ABNORMAL LOW (ref 35.0–47.0)
Hemoglobin: 10 g/dL — ABNORMAL LOW (ref 12.0–16.0)
LYMPHS ABS: 1.9 10*3/uL (ref 1.0–3.6)
LYMPHS PCT: 26 %
MCH: 26.3 pg (ref 26.0–34.0)
MCHC: 32.5 g/dL (ref 32.0–36.0)
MCV: 80.9 fL (ref 80.0–100.0)
MONO ABS: 0.4 10*3/uL (ref 0.2–0.9)
Monocytes Relative: 6 %
Neutro Abs: 4.2 10*3/uL (ref 1.4–6.5)
Neutrophils Relative %: 56 %
PLATELETS: 328 10*3/uL (ref 150–440)
RBC: 3.79 MIL/uL — AB (ref 3.80–5.20)
RDW: 16 % — AB (ref 11.5–14.5)
WBC: 7.3 10*3/uL (ref 3.6–11.0)

## 2017-03-09 LAB — BASIC METABOLIC PANEL
Anion gap: 7 (ref 5–15)
BUN: 11 mg/dL (ref 6–20)
CALCIUM: 8.3 mg/dL — AB (ref 8.9–10.3)
CO2: 24 mmol/L (ref 22–32)
CREATININE: 0.79 mg/dL (ref 0.44–1.00)
Chloride: 105 mmol/L (ref 101–111)
GFR calc Af Amer: >60 mL/min (ref >60)
GLUCOSE: 103 mg/dL — AB (ref 65–99)
Potassium: 3.8 mmol/L (ref 3.5–5.1)
SODIUM: 136 mmol/L (ref 135–145)

## 2017-03-09 LAB — BRAIN NATRIURETIC PEPTIDE: B NATRIURETIC PEPTIDE 5: 8 pg/mL (ref 0.0–100.0)

## 2017-03-09 MED ORDER — IPRATROPIUM-ALBUTEROL 0.5-2.5 (3) MG/3ML IN SOLN
3.0000 mL | Freq: Four times a day (QID) | RESPIRATORY_TRACT | Status: DC
  Administered 2017-03-09: 3 mL via RESPIRATORY_TRACT

## 2017-03-09 MED ORDER — PREDNISONE 50 MG PO TABS
60.0000 mg | ORAL_TABLET | Freq: Once | ORAL | Status: AC
  Administered 2017-03-09: 09:00:00 60 mg via ORAL

## 2017-03-09 MED ORDER — AEROCHAMBER PLUS MISC: 2 refills | Status: DC

## 2017-03-09 MED ORDER — IPRATROPIUM-ALBUTEROL 0.5-2.5 (3) MG/3ML IN SOLN
3.0000 mL | Freq: Once | RESPIRATORY_TRACT | Status: AC
Start: 2017-03-09 — End: 2017-03-09
  Administered 2017-03-09: 3 mL via RESPIRATORY_TRACT

## 2017-03-09 MED ORDER — PREDNISONE 50 MG PO TABS: 50.0000 mg | ORAL_TABLET | Freq: Every day | ORAL | 0 refills | Status: DC

## 2017-03-09 NOTE — ED Triage Notes (Signed)
Pt has been having asthma flares off and on past few weeks. Was given steroids here but started again last p.m. With another flare.

## 2017-03-09 NOTE — Discharge Instructions (Signed)
Here is a list of primary care providers who are taking new patients: ° °Dr. Deanna Jones, Dr. William Plonk °[address] °[address] °[place] [address] [phone] ° °[facility] [place] °[address] [place] [address]  °[place] [address]  °[phone] ° °[facility] °[address]  °[place], [place] [address] °[phone] ° °[facility] [place] °[address]  °[phone] °[place], [place] [address] ° °Here are clinics/ other resources who will see you if you do not have insurance. Some have certain criteria that you must meet. Call them and find out what they are: ° °[facility]: °[address] [place] [address] [place], [place] [address] °Phone: [phone] °Hours: First and Third Saturdays of each Month, 9 a.m. - 1 p.m. ° °[facility]: °[address], [place], [place] [address] °Phone: [phone] °Hours: °Tuesday, 4 p.m. - 8 p.m. °Thursday, 1 p.m. - 8 p.m. °Wednesday, 9 a.m. - Noon ° °[facility] °[address], [place], [place] [address] °Phone: [phone] °Pharmacy Phone Number: [phone] °Dental Phone Number: [phone] °ACA Insurance Help: [phone] ° °Dental Hours: °Monday - Thursday, 8 a.m. - 6 p.m. ° °[facility] °[address] [place], [place] [address] °Phone: [phone] °Pharmacy Phone Number: [phone] °ACA Insurance Help: [phone] ° °[facility] °[address] [place], [place] [address] °Phone: [phone] °Pharmacy Phone Number: [phone] °ACA Insurance Help: [phone] ° °[facility] °[address] [place], [place] [address] °Phone: [phone] °ACA Insurance Help: [phone]  ° °[facility] °[address] [place], [place] [address] °Phone: [phone] ° °Go to www.goodrx.com to look up your medications. This will give you a list of where you can find your prescriptions at the most affordable prices. Or ask the pharmacist what the cash price is,  or if they have any other discount programs available to help make your medication more affordable. This can be less expensive than what you would pay with insurance.   °

## 2017-03-09 NOTE — ED Provider Notes (Signed)
HPI  SUBJECTIVE:  Sabrina Holder is a 36 y.o. female who presents with A presumed asthma exacerbation.  She reports coughing, wheezing, shortness of breath, dyspnea on exertion, chest tightness.  She reports 5 pillow orthopnea and PND last night.  States this is been going on for a while.  She reports persistent nasal drip, but states that the nasal congestion is gotten better with Allegra-D and the Atrovent nasal spray.  She denies fevers, nausea, vomiting, hemoptysis.  No calf swelling, calf pain, prolonged immobilization, surgery in the past 4 weeks.  No exogenous estrogen.  No unintentional weight gain, nocturia, abdominal pain. She was seen here on 11/25 with an asthma exacerbation thought to be due to allergies/postnasal drip.Marland Kitchen  She was sent home with a steroid taper, albuterol inhaler, Allegra-D, Atrovent nasal spray, doxycycline.  She states that she finished all of these and states that she is compliant with her Advair.  She has tried Vicks, albuterol nebs, states that she is using her albuterol every 30 minutes, doxycycline, Allegra-D, Atrovent nasal spray.  The albuterol helps.  Symptoms are worse with lying down flat and with exposure to cold air.  She states that this is identical to previous asthma exacerbations, and thinks that this was triggered by the cold air. Past medical history of asthma.  No history of PE, DVT, CHF, diabetes, hypertension.  LMP: 11/23.  Denies possibility being pregnant.  PMD: None  Past Medical History:  Diagnosis Date  . Anxiety   . Asthma   . Environmental allergies   . Renal disorder     Past Surgical History:  Procedure Laterality Date  . CESAREAN SECTION    . TUBAL LIGATION      Family History  Problem Relation Age of Onset  . Asthma Mother   . Gout Father     Social History   Tobacco Use  . Smoking status: Former Smoker    Types: Cigarettes  . Smokeless tobacco: Never Used  Substance Use Topics  . Alcohol use: No  . Drug use: No     No current facility-administered medications for this encounter.   Current Outpatient Medications:  .  fluticasone-salmeterol (ADVAIR HFA) 230-21 MCG/ACT inhaler, Inhale 2 puffs into the lungs 2 (two) times daily., Disp: , Rfl:  .  albuterol (PROVENTIL HFA;VENTOLIN HFA) 108 (90 Base) MCG/ACT inhaler, Inhale 2 puffs into the lungs every 4 (four) hours as needed for wheezing or shortness of breath., Disp: 1 Inhaler, Rfl: 1 .  fexofenadine-pseudoephedrine (ALLEGRA-D ALLERGY & CONGESTION) 180-240 MG 24 hr tablet, Take 1 tablet by mouth daily., Disp: 30 tablet, Rfl: 1 .  ipratropium (ATROVENT) 0.06 % nasal spray, Place 2 sprays into both nostrils 4 (four) times daily. 3-4 times/ day, Disp: 15 mL, Rfl: 0 .  ipratropium-albuterol (DUONEB) 0.5-2.5 (3) MG/3ML SOLN, Take 3 mLs by nebulization every 6 (six) hours as needed., Disp: 360 mL, Rfl: 0 .  montelukast (SINGULAIR) 10 MG tablet, Take 1 tablet (10 mg total) by mouth at bedtime., Disp: 30 tablet, Rfl: 1 .  predniSONE (DELTASONE) 50 MG tablet, Take 1 tablet (50 mg total) by mouth daily with breakfast., Disp: 5 tablet, Rfl: 0 .  sertraline (ZOLOFT) 50 MG tablet, Take 1 tablet (50 mg total) by mouth daily., Disp: 20 tablet, Rfl: 0 .  Spacer/Aero-Holding Chambers (AEROCHAMBER PLUS) inhaler, Use as instructed, Disp: 1 each, Rfl: 2  No Known Allergies   ROS  As noted in HPI.   Physical Exam  BP 129/84 (BP Location:  Left Arm)   Pulse (!) 109   Temp 98.4 F (36.9 C) (Oral)   Resp (!) 24   Ht 5\' 4"  (1.626 m)   Wt 226 lb (102.5 kg)   LMP 02/21/2017   SpO2 99%   BMI 38.79 kg/m   Constitutional: Well developed, well nourished, no acute distress Eyes:  EOMI, conjunctiva normal bilaterally HENT: Normocephalic, atraumatic,mucus membranes moist. No congestion, obvious postnasal drip Respiratory: Normal inspiratory effort fair air movement, diffuse expiratory wheezing throughout all lung fields.  No chest wall tenderness  cardiovascular: Mild  regular tachycardia, no murmurs, rubs, gallops GI: nondistended skin: No rash, skin intact Musculoskeletal: no deformities.  Calves symmetric, nontender, no edema. Neurologic: Alert & oriented x 3, no focal neuro deficits Psychiatric: Speech and behavior appropriate   ED Course   Medications  ipratropium-albuterol (DUONEB) 0.5-2.5 (3) MG/3ML nebulizer solution 3 mL (3 mLs Nebulization Given 03/09/17 0906)  predniSONE (DELTASONE) tablet 60 mg (60 mg Oral Given 03/09/17 0854)    Orders Placed This Encounter  Procedures  . DG Chest 2 View    Standing Status:   Standing    Number of Occurrences:   1    Order Specific Question:   Reason for Exam (SYMPTOM  OR DIAGNOSIS REQUIRED)    Answer:   r/o pna effusion pulm edema  . CBC with Differential    Standing Status:   Standing    Number of Occurrences:   1  . Basic metabolic panel    Standing Status:   Standing    Number of Occurrences:   1    Results for orders placed or performed during the hospital encounter of 03/09/17 (from the past 24 hour(s))  CBC with Differential     Status: Abnormal   Collection Time: 03/09/17  9:04 AM  Result Value Ref Range   WBC 7.3 3.6 - 11.0 K/uL   RBC 3.79 (L) 3.80 - 5.20 MIL/uL   Hemoglobin 10.0 (L) 12.0 - 16.0 g/dL   HCT 30.7 (L) 35.0 - 47.0 %   MCV 80.9 80.0 - 100.0 fL   MCH 26.3 26.0 - 34.0 pg   MCHC 32.5 32.0 - 36.0 g/dL   RDW 16.0 (H) 11.5 - 14.5 %   Platelets 328 150 - 440 K/uL   Neutrophils Relative % 56 %   Neutro Abs 4.2 1.4 - 6.5 K/uL   Lymphocytes Relative 26 %   Lymphs Abs 1.9 1.0 - 3.6 K/uL   Monocytes Relative 6 %   Monocytes Absolute 0.4 0.2 - 0.9 K/uL   Eosinophils Relative 11 %   Eosinophils Absolute 0.8 (H) 0 - 0.7 K/uL   Basophils Relative 1 %   Basophils Absolute 0.1 0 - 0.1 K/uL  Basic metabolic panel     Status: Abnormal   Collection Time: 03/09/17  9:04 AM  Result Value Ref Range   Sodium 136 135 - 145 mmol/L   Potassium 3.8 3.5 - 5.1 mmol/L   Chloride 105 101 -  111 mmol/L   CO2 24 22 - 32 mmol/L   Glucose, Bld 103 (H) 65 - 99 mg/dL   BUN 11 6 - 20 mg/dL   Creatinine, Ser 0.79 0.44 - 1.00 mg/dL   Calcium 8.3 (L) 8.9 - 10.3 mg/dL   GFR calc non Af Amer >60 >60 mL/min   GFR calc Af Amer >60 >60 mL/min   Anion gap 7 5 - 15   Dg Chest 2 View  Result Date: 03/09/2017 CLINICAL DATA:  Patient  complains of asthma and troubled breathing for a few days. Patient denies any chance of pregnancy and was properly shielded. EXAM: CHEST  2 VIEW COMPARISON:  02/12/2016 FINDINGS: Normal mediastinum and cardiac silhouette. Normal pulmonary vasculature. No evidence of effusion, infiltrate, or pneumothorax. No acute bony abnormality. IMPRESSION: Normal chest radiograph Electronically Signed   By: Suzy Bouchard M.D.   On: 03/09/2017 09:27    ED Clinical Impression  Mild persistent asthma with exacerbation   ED Assessment/Plan  Previous records reviewed as noted in HPI.  Patient states that she feels better after the first DuoNeb.  In the differential is PE and CHF however she has diffuse wheezing no other risk factors for PE, she has no signs of volume overload such as lower extremity edema.  Patient is able to tolerate lying flat.  Will get chest x-ray, give another DuoNeb, 60 mg of prednisone p.o. and reevaluate.  On reevaluation post second DuoNeb, patient states she feels significantly better than when she came in.  She still has diffuse wheezing, slightly prolonged expiratory phase.  Labs reviewed.  She has some mild anemia, previous hemoglobin was 11.9 last year.  Otherwise, CMP and CBC are unremarkable.  Reviewed imaging independently.  Normal chest x-ray . see radiology report for full details.  Presentation consistent with repeated asthma exacerbation, most likely from the cold air.  Will follow up on the BNP and advised patient that she will need to go to the ER if it comes back positive.  Gave patient very strict ER return precautions.  Otherwise, 1-2  puffs from her albuterol inhaler using a spacer every 4-6 hours, 50 mg of prednisone for 5 days, will provide primary care referral list, as I feel that she probably has poorly controlled asthma, will also order primary care referral.   BNP was canceled inadvertently.  Called patient at home and discussed this with her.  Feel that the likelihood of congestive heart failure is low enough that we do not need to bring her back in for repeat labs.  She states that she feels significantly better compared to this morning with the bronchodilators and the steroids.  Encouraged her to find a primary care physician to help her control her asthma.  States that she is compliant with the Advair and the Singulair.  She will go to the ER for any worsening of her symptoms or for other concerns.   Discussed labs, imaging, MDM, plan and followup with patient. Discussed sn/sx that should prompt return to the ED. patient agrees with plan.   Meds ordered this encounter  Medications  . DISCONTD: ipratropium-albuterol (DUONEB) 0.5-2.5 (3) MG/3ML nebulizer solution 3 mL  . ipratropium-albuterol (DUONEB) 0.5-2.5 (3) MG/3ML nebulizer solution 3 mL  . predniSONE (DELTASONE) tablet 60 mg  . predniSONE (DELTASONE) 50 MG tablet    Sig: Take 1 tablet (50 mg total) by mouth daily with breakfast.    Dispense:  5 tablet    Refill:  0  . Spacer/Aero-Holding Chambers (AEROCHAMBER PLUS) inhaler    Sig: Use as instructed    Dispense:  1 each    Refill:  2    *This clinic note was created using Lobbyist. Therefore, there may be occasional mistakes despite careful proofreading.   ?   Melynda Ripple, MD 03/09/17 [phone]

## 2017-03-29 DIAGNOSIS — J45901 Unspecified asthma with (acute) exacerbation: Secondary | ICD-10-CM | POA: Insufficient documentation

## 2017-03-29 DIAGNOSIS — J4550 Severe persistent asthma, uncomplicated: Secondary | ICD-10-CM | POA: Insufficient documentation

## 2017-04-19 ENCOUNTER — Emergency Department
Admission: EM | Admit: 2017-04-19 | Discharge: 2017-04-19 | Disposition: A | Discharge status: Home / Self Care | Payer: 59 | Attending: Emergency Medicine | Admitting: Emergency Medicine

## 2017-04-19 ENCOUNTER — Other Ambulatory Visit: Payer: Self-pay

## 2017-04-19 ENCOUNTER — Encounter: Payer: Self-pay | Admitting: *Deleted

## 2017-04-19 DIAGNOSIS — J111 Influenza due to unidentified influenza virus with other respiratory manifestations: Secondary | ICD-10-CM | POA: Insufficient documentation

## 2017-04-19 DIAGNOSIS — R509 Fever, unspecified: Secondary | ICD-10-CM | POA: Diagnosis present

## 2017-04-19 DIAGNOSIS — J45909 Unspecified asthma, uncomplicated: Secondary | ICD-10-CM | POA: Insufficient documentation

## 2017-04-19 DIAGNOSIS — Z79899 Other long term (current) drug therapy: Secondary | ICD-10-CM | POA: Diagnosis not present

## 2017-04-19 DIAGNOSIS — Z87891 Personal history of nicotine dependence: Secondary | ICD-10-CM | POA: Insufficient documentation

## 2017-04-19 DIAGNOSIS — R69 Illness, unspecified: Secondary | ICD-10-CM

## 2017-04-19 LAB — INFLUENZA PANEL BY PCR (TYPE A & B)
INFLAPCR: NEGATIVE
INFLBPCR: NEGATIVE

## 2017-04-19 MED ORDER — ALBUTEROL SULFATE HFA 108 (90 BASE) MCG/ACT IN AERS: 2.0000 | INHALATION_SPRAY | RESPIRATORY_TRACT | 1 refills | Status: DC | PRN

## 2017-04-19 MED ORDER — GUAIFENESIN-CODEINE 100-10 MG/5ML PO SOLN: 10.0000 mL | Freq: Three times a day (TID) | ORAL | 0 refills | Status: DC | PRN

## 2017-04-19 MED ORDER — ACETAMINOPHEN 325 MG PO TABS
650.0000 mg | ORAL_TABLET | Freq: Once | ORAL | Status: AC | PRN
Start: 2017-04-19 — End: 2017-04-19
  Administered 2017-04-19: 650 mg via ORAL
  Filled 2017-04-19: qty 2

## 2017-04-19 MED ORDER — PREDNISONE 10 MG PO TABS: 50.0000 mg | ORAL_TABLET | Freq: Every day | ORAL | 0 refills | Status: DC

## 2017-04-19 NOTE — ED Provider Notes (Signed)
[facility] Emergency Department Provider Note  ____________________________________________  Time seen: Approximately 3:21 PM  I have reviewed the triage vital signs and the nursing notes.   HISTORY  Chief Complaint Cough; Generalized Body Aches; and Fever   HPI Sabrina Holder is a 37 y.o. female who presents to the emergency department for evaluation and fever, body aches, and asthma exacerbation.  She states that she used her albuterol inhaler with some morning, however the wheezing and cough returned shortly afterward.  Past Medical History:  Diagnosis Date  . Anxiety   . Asthma   . Environmental allergies   . Renal disorder     There are no active problems to display for this patient.   Past Surgical History:  Procedure Laterality Date  . CESAREAN SECTION    . TUBAL LIGATION      Prior to Admission medications   Medication Sig Start Date End Date Taking? Authorizing Provider  albuterol (PROVENTIL HFA;VENTOLIN HFA) 108 (90 Base) MCG/ACT inhaler Inhale 2 puffs into the lungs every 4 (four) hours as needed for wheezing or shortness of breath. 02/24/17   Melynda Ripple, MD  fexofenadine-pseudoephedrine (ALLEGRA-D ALLERGY & CONGESTION) 180-240 MG 24 hr tablet Take 1 tablet by mouth daily. 02/24/17   Melynda Ripple, MD  fluticasone-salmeterol (ADVAIR HFA) [phone] MCG/ACT inhaler Inhale 2 puffs into the lungs 2 (two) times daily.    [provider]  ipratropium (ATROVENT) 0.06 % nasal spray Place 2 sprays into both nostrils 4 (four) times daily. 3-4 times/ day 02/24/17   Melynda Ripple, MD  ipratropium-albuterol (DUONEB) 0.5-2.5 (3) MG/3ML SOLN Take 3 mLs by nebulization every 6 (six) hours as needed. 02/24/17   Melynda Ripple, MD  montelukast (SINGULAIR) 10 MG tablet Take 1 tablet (10 mg total) by mouth at bedtime. 08/16/16   Frederich Cha, MD  predniSONE (DELTASONE) 50 MG tablet Take 1 tablet (50 mg total) by mouth daily with  breakfast. 03/09/17   Melynda Ripple, MD  sertraline (ZOLOFT) 50 MG tablet Take 1 tablet (50 mg total) by mouth daily. 01/11/16   Norval Gable, MD  Spacer/Aero-Holding Chambers (AEROCHAMBER PLUS) inhaler Use as instructed 03/09/17   Melynda Ripple, MD    Allergies Patient has no known allergies.  Family History  Problem Relation Age of Onset  . Asthma Mother   . Gout Father     Social History Social History   Tobacco Use  . Smoking status: Former Smoker    Types: Cigarettes  . Smokeless tobacco: Never Used  Substance Use Topics  . Alcohol use: No  . Drug use: No    Review of Systems Constitutional: Positive for fever/chills ENT: Negative for sore throat. Cardiovascular: Denies chest pain. Respiratory: Negative for shortness of breath.  Positive for cough. Gastrointestinal: Negative for nausea, no vomiting.  No diarrhea.  Musculoskeletal: Positive for body aches Skin: Negative for rash. Neurological: Negative for headaches ____________________________________________   PHYSICAL EXAM:  VITAL SIGNS: ED Triage Vitals  Enc Vitals Group     BP 04/19/17 1323 138/76     Pulse Rate 04/19/17 1323 (!) 120     Resp 04/19/17 1323 16     Temp 04/19/17 1323 (!) 101 F (38.3 C)     Temp Source 04/19/17 1323 Oral     SpO2 04/19/17 1323 100 %     Weight 04/19/17 1322 227 lb (103 kg)     Height 04/19/17 1322 5\' 4"  (1.626 m)     Head Circumference --  Peak Flow --      Pain Score 04/19/17 1322 9     Pain Loc --      Pain Edu? --      Excl. in [place]? --     Constitutional: Alert and oriented.  Acutely ill appearing and in no acute distress. Eyes: Conjunctivae are normal. EOMI. Ears: Bilateral tympanic membranes are pink and injected Nose: Sinus congestion noted; no rhinnorhea. Mouth/Throat: Mucous membranes are moist.  Oropharynx clear. Tonsils 1+ without exudate. Neck: No stridor.  Lymphatic: Bilateral anterior cervical lymphadenopathy. Cardiovascular: Normal  rate, regular rhythm. Good peripheral circulation. Respiratory: Normal respiratory effort.  No retractions.  Scattered expiratory wheezes noted throughout with rhonchi in the right lower lobe that clears with cough.. Gastrointestinal: Soft and nontender.  Musculoskeletal: FROM x 4 extremities.  Neurologic:  Normal speech and language.  Skin:  Skin is warm, dry and intact. No rash noted. Psychiatric: Mood and affect are normal. Speech and behavior are normal.  ____________________________________________   LABS (all labs ordered are listed, but only abnormal results are displayed)  Labs Reviewed  INFLUENZA PANEL BY PCR (TYPE A & B)   ____________________________________________  EKG  Not indicated ____________________________________________  RADIOLOGY  Not indicated ____________________________________________   PROCEDURES  Procedure(s) performed: None  Critical Care performed: No ____________________________________________   INITIAL IMPRESSION / ASSESSMENT AND PLAN / ED COURSE  37 year old female presenting to the emergency department for treatment and evaluation of symptoms and exam most consistent with an influenza-like illness that has triggered an asthma exacerbation.  She will be treated with prednisone and given a refill of her albuterol inhaler.  She will also be given guaifenesin with codeine to help with the cough.  She was instructed to increase fluids and rest over the weekend.  She was advised to follow-up with the primary care provider for choice or return to the emergency department for symptoms of change or worsen.  Medications  acetaminophen (TYLENOL) tablet 650 mg (650 mg Oral Given 04/19/17 1329)    ED Discharge Orders    None      Pertinent labs & imaging results that were available during my care of the patient were reviewed by me and considered in my medical decision making (see chart for details).    If controlled substance prescribed  during this visit, 12 month history viewed on the [place] prior to issuing an initial prescription for Schedule II or III opiod. ____________________________________________   FINAL CLINICAL IMPRESSION(S) / ED DIAGNOSES  Final diagnoses:  None    Note:  This document was prepared using Dragon voice recognition software and may include unintentional dictation errors.     Victorino Dike, FNP 04/19/17 1541    Harvest Dark, MD 04/24/17 1946

## 2017-04-19 NOTE — ED Notes (Signed)
See triage note  Presents with fever and body aches which started this am  Febrile on arrival

## 2017-04-19 NOTE — ED Triage Notes (Signed)
Pt to ED reporting she had an asthma attack this morning that has since improved. Pt continues to reports intermittent SOB but reports, "I have my inhaler I can use it" PT also reports a headache and generalized body aches throughout the days. PT was sent home from work after reportedly spiking a 103 fever. Pt denies having taken medications for the fever.

## 2017-05-13 ENCOUNTER — Other Ambulatory Visit: Payer: Self-pay | Admitting: Primary Care

## 2017-05-13 DIAGNOSIS — N920 Excessive and frequent menstruation with regular cycle: Secondary | ICD-10-CM

## 2017-05-13 DIAGNOSIS — D649 Anemia, unspecified: Secondary | ICD-10-CM

## 2017-05-16 ENCOUNTER — Ambulatory Visit: Payer: 59

## 2017-11-05 ENCOUNTER — Encounter: Payer: 59 | Admitting: Podiatry

## 2017-11-05 DIAGNOSIS — E6609 Other obesity due to excess calories: Secondary | ICD-10-CM | POA: Insufficient documentation

## 2017-11-05 DIAGNOSIS — Z6839 Body mass index (BMI) 39.0-39.9, adult: Secondary | ICD-10-CM

## 2017-11-05 DIAGNOSIS — R7303 Prediabetes: Secondary | ICD-10-CM | POA: Insufficient documentation

## 2017-11-09 NOTE — Progress Notes (Signed)
This encounter was created in error - please disregard.

## 2017-11-12 ENCOUNTER — Institutional Professional Consult (permissible substitution): Payer: 59 | Admitting: Internal Medicine

## 2017-11-12 NOTE — Progress Notes (Deleted)
The patient is 37 year old female with a history of asthma.  Review of records shows that she has had 5 ED or urgent care visits last year alone for asthma exacerbation.  **CBC 03/09/2017>> absolute eosinophil count equals 800. **Chest x-ray 03/09/2018>> images personally reviewed, lungs are normal.

## 2017-11-13 ENCOUNTER — Encounter: Payer: Self-pay | Admitting: Internal Medicine

## 2018-01-22 ENCOUNTER — Other Ambulatory Visit: Payer: Self-pay

## 2018-01-22 ENCOUNTER — Ambulatory Visit
Admission: EM | Admit: 2018-01-22 | Discharge: 2018-01-22 | Disposition: A | Discharge status: Home / Self Care | Payer: 59 | Attending: Physician Assistant | Admitting: Physician Assistant

## 2018-01-22 ENCOUNTER — Encounter: Payer: Self-pay | Admitting: Emergency Medicine

## 2018-01-22 DIAGNOSIS — J4531 Mild persistent asthma with (acute) exacerbation: Secondary | ICD-10-CM | POA: Diagnosis not present

## 2018-01-22 DIAGNOSIS — R059 Cough, unspecified: Secondary | ICD-10-CM

## 2018-01-22 DIAGNOSIS — R05 Cough: Secondary | ICD-10-CM

## 2018-01-22 DIAGNOSIS — R0602 Shortness of breath: Secondary | ICD-10-CM

## 2018-01-22 DIAGNOSIS — J45901 Unspecified asthma with (acute) exacerbation: Secondary | ICD-10-CM

## 2018-01-22 MED ORDER — METHYLPREDNISOLONE SODIUM SUCC 125 MG IJ SOLR
125.0000 mg | Freq: Once | INTRAMUSCULAR | Status: AC
  Administered 2018-01-22: 125 mg via INTRAMUSCULAR

## 2018-01-22 MED ORDER — PREDNISONE 20 MG PO TABS: ORAL_TABLET | ORAL | 0 refills | Status: DC

## 2018-01-22 MED ORDER — ALBUTEROL SULFATE HFA 108 (90 BASE) MCG/ACT IN AERS: INHALATION_SPRAY | RESPIRATORY_TRACT | 0 refills | Status: DC

## 2018-01-22 MED ORDER — ALBUTEROL SULFATE (2.5 MG/3ML) 0.083% IN NEBU
2.5000 mg | INHALATION_SOLUTION | Freq: Once | RESPIRATORY_TRACT | Status: AC
  Administered 2018-01-22: 2.5 mg via RESPIRATORY_TRACT

## 2018-01-22 MED ORDER — ALBUTEROL SULFATE (2.5 MG/3ML) 0.083% IN NEBU: 2.5000 mg | INHALATION_SOLUTION | RESPIRATORY_TRACT | 0 refills | Status: DC | PRN

## 2018-01-22 NOTE — ED Triage Notes (Signed)
Patient in today c/o cough, sob, headache, sinus pressure  and sob x 2 weeks. Patient has a history of asthma. Patient denies fever. Patient has tried OTC Nyquil, Dayquil, Mucinex and her albuterol inhaler without relief.

## 2018-01-22 NOTE — ED Provider Notes (Signed)
[facility]    CSN: [id number] Arrival date & time: 01/22/18  1440     History   Chief Complaint Chief Complaint  Patient presents with  . Cough  . Shortness of Breath    HPI Sabrina Holder is a 37 y.o. female. Patient is an [place] American female with a history of allergies and asthma who presents for 2 week history of difficulty breathing and wheezing. She says that she she recently ran out of nebulizer solution. She uses Advair, singular, and Claritin daily and ProAir as needed. Denies fevers. Denies nasal congestion, sore throat and ear pain. She says that occasionally she will have flare ups of her asthma and allergies that require prednisone and believes she is to that point today. She has no other concerns or complaints today.  HPI  Past Medical History:  Diagnosis Date  . Anxiety   . Asthma   . Environmental allergies   . Renal disorder     Patient Active Problem List   Diagnosis Date Noted  . Class 2 obesity due to excess calories with body mass index (BMI) of 39.0 to 39.9 in adult 11/05/2017  . Prediabetes 11/05/2017  . Moderate asthma with acute exacerbation 03/29/2017    Past Surgical History:  Procedure Laterality Date  . CESAREAN SECTION    . TUBAL LIGATION      OB History    Gravida  5   Para  5   Term  5   Preterm      AB      Living        SAB      TAB      Ectopic      Multiple      Live Births               Home Medications    Prior to Admission medications   Medication Sig Start Date End Date Taking? Authorizing Provider  albuterol (PROVENTIL HFA;VENTOLIN HFA) 108 (90 Base) MCG/ACT inhaler Inhale 2 puffs into the lungs every 4 (four) hours as needed for wheezing or shortness of breath. 04/19/17  Yes Triplett, Cari B, FNP  escitalopram (LEXAPRO) 20 MG tablet TAKE 1 TABLET BY MOUTH ONCE DAILY FOR MOOD 09/03/17  Yes [provider]  fexofenadine-pseudoephedrine (ALLEGRA-D ALLERGY & CONGESTION)  180-240 MG 24 hr tablet Take 1 tablet by mouth daily. 02/24/17  Yes Melynda Ripple, MD  fluticasone (FLONASE) 50 MCG/ACT nasal spray Place into the nose.   Yes [provider]  fluticasone-salmeterol (ADVAIR HFA) 230-21 MCG/ACT inhaler Inhale 2 puffs into the lungs 2 (two) times daily.   Yes [provider]  montelukast (SINGULAIR) 10 MG tablet Take 1 tablet (10 mg total) by mouth at bedtime. 08/16/16  Yes Frederich Cha, MD  albuterol (PROVENTIL HFA;VENTOLIN HFA) 108 [phone] Base) MCG/ACT inhaler Inhale 1-2 puffs q 4-6 hrs as needed x 30 days 01/22/18   Laurene Footman B, PA-C  albuterol (PROVENTIL) (2.5 MG/3ML) 0.083% nebulizer solution Take 3 mLs (2.5 mg total) by nebulization every 4 (four) hours as needed for wheezing or shortness of breath. 01/22/18 02/21/18  Laurene Footman B, PA-C  guaiFENesin-codeine 100-10 MG/5ML syrup Take 10 mLs by mouth 3 (three) times daily as needed. 04/19/17   Triplett, Cari B, FNP  ipratropium (ATROVENT) 0.06 % nasal spray Place 2 sprays into both nostrils 4 (four) times daily. 3-4 times/ day 02/24/17   Melynda Ripple, MD  ipratropium-albuterol (DUONEB) 0.5-2.5 (3) MG/3ML SOLN Take 3 mLs  by nebulization every 6 (six) hours as needed. 02/24/17   Melynda Ripple, MD  predniSONE (DELTASONE) 20 MG tablet Take 2 tabs PO x 5 days and 1 tab PO x 2 days 01/22/18   Danton Clap, PA-C  ranitidine (ZANTAC) 300 MG tablet Take 300 mg by mouth. 03/29/17 03/29/18  [provider]  sertraline (ZOLOFT) 50 MG tablet Take 1 tablet (50 mg total) by mouth daily. 01/11/16   Norval Gable, MD  Spacer/Aero-Holding Chambers (AEROCHAMBER PLUS) inhaler Use as instructed 03/09/17   Melynda Ripple, MD    Family History Family History  Problem Relation Age of Onset  . Asthma Mother   . Gout Father     Social History Social History   Tobacco Use  . Smoking status: Former Smoker    Types: Cigarettes    Last attempt to quit: 12/23/2017    Years since  quitting: 0.0  . Smokeless tobacco: Never Used  Substance Use Topics  . Alcohol use: No  . Drug use: No     Allergies   Patient has no known allergies.   Review of Systems Review of Systems  Constitutional: Negative for chills, fatigue and fever.  HENT: Positive for congestion, postnasal drip and sinus pressure. Negative for ear pain, rhinorrhea, sore throat and trouble swallowing.   Eyes: Negative for discharge and redness.  Respiratory: Positive for cough, shortness of breath and wheezing. Negative for chest tightness.   Cardiovascular: Negative for chest pain.  Gastrointestinal: Negative for nausea and vomiting.  Musculoskeletal: Negative for arthralgias and myalgias.  Skin: Negative for color change and rash.  Allergic/Immunologic: Positive for environmental allergies.  Neurological: Negative for weakness and headaches.  Hematological: Negative for adenopathy.     Physical Exam Triage Vital Signs ED Triage Vitals [01/22/18 1452]  Enc Vitals Group     BP 134/79     Pulse Rate (!) 102     Resp 20     Temp 99.1 F (37.3 C)     Temp Source Oral     SpO2 95 %     Weight 224 lb (101.6 kg)     Height 5\' 5"  (1.651 m)     Head Circumference      Peak Flow      Pain Score 0     Pain Loc      Pain Edu?      Excl. in [place]?    No data found.  Updated Vital Signs BP 134/79 (BP Location: Left Arm)   Pulse (!) 102   Temp 99.1 F (37.3 C) (Oral)   Resp 20   Ht 5\' 5"  (1.651 m)   Wt 224 lb (101.6 kg)   LMP 12/27/2017 (Approximate)   SpO2 95%   BMI 37.28 kg/m       Physical Exam  Constitutional: She is oriented to person, place, and time. She appears well-developed and well-nourished.  Non-toxic appearance. She does not appear ill. No distress.  HENT:  Head: Normocephalic and atraumatic.  Mouth/Throat: Oropharynx is clear and moist.  Eyes: Pupils are equal, round, and reactive to light. EOM are normal.  Neck: Neck supple.  Cardiovascular: Normal rate, regular  rhythm and normal heart sounds.  No murmur heard. Pulmonary/Chest: Effort normal. No accessory muscle usage. No respiratory distress. She has no decreased breath sounds. She has wheezes in the right upper field, the right middle field, the right lower field, the left upper field, the left middle field and the left lower field. She has  no rhonchi. She has no rales.  Lymphadenopathy:    She has no cervical adenopathy.  Neurological: She is alert and oriented to person, place, and time.  Skin: Skin is warm and dry. No rash noted.  Psychiatric: She has a normal mood and affect. Her behavior is normal.  Nursing note and vitals reviewed.    UC Treatments / Results  Labs (all labs ordered are listed, but only abnormal results are displayed) Labs Reviewed - No data to display  EKG None  Radiology No results found.  Procedures Procedures (including critical care time)  Medications Ordered in UC Medications  albuterol (PROVENTIL) (2.5 MG/3ML) 0.083% nebulizer solution 2.5 mg (2.5 mg Nebulization Given 01/22/18 1521)  methylPREDNISolone sodium succinate (SOLU-MEDROL) 125 mg/2 mL injection 125 mg (125 mg Intramuscular Given 01/22/18 1518)    Initial Impression / Assessment and Plan / UC Course  I have reviewed the triage vital signs and the nursing notes.  Pertinent labs & imaging results that were available during my care of the patient were reviewed by me and considered in my medical decision making (see chart for details).   Patient given albuterol neb treatment and 125  Mg IM solu medrol in clinic. She did have improvement in breathing after neb treatment. Advised to continue medications as prescribed and begin prednisone tomorrow. VSS and no suspicion for pneumonia. Discussed red flags and when to f/u with our department or ED.  Final Clinical Impressions(s) / UC Diagnoses   Final diagnoses:  Exacerbation of persistent asthma, unspecified asthma severity  Shortness of breath    Cough     Discharge Instructions     ASTHMA: You are having an asthma exacerbation. You received an albuterol nebulizer treatment in the clinic today  as well as 125 mg IM injection of solu medrol (a corticosteroid). Begin prednisone tomorrow. Continue Advair as prescribed and albuterol inhaler or nebulizer every 4-6 hours as needed. Continue Singulair and Claritin D. F/u with our clinic or ER for any worsening of breathing, chest discomfort, fever, or any worsening of condition over the next few days.     ED Prescriptions    Medication Sig Dispense Auth. Provider   predniSONE (DELTASONE) 20 MG tablet Take 2 tabs PO x 5 days and 1 tab PO x 2 days 12 tablet Laurene Footman B, PA-C   albuterol (PROVENTIL HFA;VENTOLIN HFA) 108 (90 Base) MCG/ACT inhaler Inhale 1-2 puffs q 4-6 hrs as needed x 30 days 1 Inhaler Laurene Footman B, PA-C   albuterol (PROVENTIL) (2.5 MG/3ML) 0.083% nebulizer solution Take 3 mLs (2.5 mg total) by nebulization every 4 (four) hours as needed for wheezing or shortness of breath. 50 mL Laurene Footman B, PA-C     Controlled Substance Prescriptions [place] Controlled Substance Registry consulted? Not Applicable   Gretta Cool 01/23/18 6720

## 2018-01-22 NOTE — Discharge Instructions (Signed)
ASTHMA: You are having an asthma exacerbation. You received an albuterol nebulizer treatment in the clinic today  as well as 125 mg IM injection of solu medrol (a corticosteroid). Begin prednisone tomorrow. Continue Advair as prescribed and albuterol inhaler or nebulizer every 4-6 hours as needed. Continue Singulair and Claritin D. F/u with our clinic or ER for any worsening of breathing, chest discomfort, fever, or any worsening of condition over the next few days.

## 2018-04-05 ENCOUNTER — Encounter: Payer: Self-pay | Admitting: Emergency Medicine

## 2018-04-05 ENCOUNTER — Emergency Department
Admission: EM | Admit: 2018-04-05 | Discharge: 2018-04-05 | Disposition: A | Discharge status: Home / Self Care | Payer: 59 | Attending: Emergency Medicine | Admitting: Emergency Medicine

## 2018-04-05 ENCOUNTER — Other Ambulatory Visit: Payer: Self-pay

## 2018-04-05 DIAGNOSIS — J45901 Unspecified asthma with (acute) exacerbation: Secondary | ICD-10-CM

## 2018-04-05 DIAGNOSIS — R0602 Shortness of breath: Secondary | ICD-10-CM | POA: Diagnosis present

## 2018-04-05 DIAGNOSIS — Z87891 Personal history of nicotine dependence: Secondary | ICD-10-CM | POA: Insufficient documentation

## 2018-04-05 DIAGNOSIS — F419 Anxiety disorder, unspecified: Secondary | ICD-10-CM | POA: Insufficient documentation

## 2018-04-05 DIAGNOSIS — Z79899 Other long term (current) drug therapy: Secondary | ICD-10-CM | POA: Diagnosis not present

## 2018-04-05 DIAGNOSIS — J45909 Unspecified asthma, uncomplicated: Secondary | ICD-10-CM | POA: Insufficient documentation

## 2018-04-05 MED ORDER — ALBUTEROL SULFATE HFA 108 (90 BASE) MCG/ACT IN AERS: 2.0000 | INHALATION_SPRAY | RESPIRATORY_TRACT | 1 refills | Status: DC | PRN

## 2018-04-05 MED ORDER — IPRATROPIUM-ALBUTEROL 0.5-2.5 (3) MG/3ML IN SOLN
3.0000 mL | Freq: Once | RESPIRATORY_TRACT | Status: AC
  Administered 2018-04-05: 3 mL via RESPIRATORY_TRACT
  Filled 2018-04-05: qty 3

## 2018-04-05 MED ORDER — PREDNISONE 20 MG PO TABS: ORAL_TABLET | ORAL | 0 refills | Status: DC

## 2018-04-05 MED ORDER — PREDNISONE 20 MG PO TABS
60.0000 mg | ORAL_TABLET | Freq: Once | ORAL | Status: AC
  Administered 2018-04-05: 60 mg via ORAL
  Filled 2018-04-05: qty 3

## 2018-04-05 MED ORDER — IPRATROPIUM-ALBUTEROL 0.5-2.5 (3) MG/3ML IN SOLN: 3.0000 mL | RESPIRATORY_TRACT | 0 refills | Status: DC | PRN

## 2018-04-05 NOTE — ED Triage Notes (Signed)
Pt arrived with concerns over intermittent shortness of breath for the last few weeks. Pt reports attempting to use her inhaler and OTC medication with minimal relief. PT also reports productive cough. Wheezing heard on auscultation.

## 2018-04-05 NOTE — ED Provider Notes (Signed)
[facility] Emergency Department Provider Note ____________________________________________   First MD Initiated Contact with Patient 04/05/18 [phone]     (approximate)  I have reviewed the triage vital signs and the nursing notes.   HISTORY  Chief Complaint Shortness of Breath    HPI Sabrina Holder is a 38 y.o. female with PMH as noted below who presents with shortness of breath over the last several days, gradual onset, associated with cough and wheezing, as well as with postnasal drip.  The patient states that she does not really have nasal congestion but feels postnasal drip at night and then gets a sore throat and feels like it is exacerbating her asthma.  The patient has had similar episodes in the past which have been helped with albuterol and a steroid taper.  She denies chest pain, fever, vomiting, or weakness.  Past Medical History:  Diagnosis Date  . Anxiety   . Asthma   . Environmental allergies   . Renal disorder     Patient Active Problem List   Diagnosis Date Noted  . Class 2 obesity due to excess calories with body mass index (BMI) of 39.0 to 39.9 in adult 11/05/2017  . Prediabetes 11/05/2017  . Moderate asthma with acute exacerbation 03/29/2017    Past Surgical History:  Procedure Laterality Date  . CESAREAN SECTION    . TUBAL LIGATION      Prior to Admission medications   Medication Sig Start Date End Date Taking? Authorizing Provider  albuterol (PROVENTIL HFA;VENTOLIN HFA) 108 (90 Base) MCG/ACT inhaler Inhale 2 puffs into the lungs every 4 (four) hours as needed for wheezing or shortness of breath. 04/19/17   Triplett, Dessa Phi, FNP  albuterol (PROVENTIL HFA;VENTOLIN HFA) 108 (90 Base) MCG/ACT inhaler Inhale 1-2 puffs q 4-6 hrs as needed x 30 days 01/22/18   Laurene Footman B, PA-C  albuterol (PROVENTIL HFA;VENTOLIN HFA) 108 (90 Base) MCG/ACT inhaler Inhale 2 puffs into the lungs every 4 (four) hours as needed for wheezing or  shortness of breath. 04/05/18   Arta Silence, MD  albuterol (PROVENTIL) (2.5 MG/3ML) 0.083% nebulizer solution Take 3 mLs (2.5 mg total) by nebulization every 4 (four) hours as needed for wheezing or shortness of breath. 01/22/18 02/21/18  Laurene Footman B, PA-C  escitalopram (LEXAPRO) 20 MG tablet TAKE 1 TABLET BY MOUTH ONCE DAILY FOR MOOD 09/03/17   [provider]  fexofenadine-pseudoephedrine (ALLEGRA-D ALLERGY & CONGESTION) 180-240 MG 24 hr tablet Take 1 tablet by mouth daily. 02/24/17   Melynda Ripple, MD  fluticasone (FLONASE) 50 MCG/ACT nasal spray Place into the nose.    [provider]  fluticasone-salmeterol (ADVAIR HFA) 230-21 MCG/ACT inhaler Inhale 2 puffs into the lungs 2 (two) times daily.    [provider]  guaiFENesin-codeine 100-10 MG/5ML syrup Take 10 mLs by mouth 3 (three) times daily as needed. 04/19/17   Triplett, Cari B, FNP  ipratropium (ATROVENT) 0.06 % nasal spray Place 2 sprays into both nostrils 4 (four) times daily. 3-4 times/ day 02/24/17   Melynda Ripple, MD  ipratropium-albuterol (DUONEB) 0.5-2.5 (3) MG/3ML SOLN Take 3 mLs by nebulization every 6 (six) hours as needed. 02/24/17   Melynda Ripple, MD  ipratropium-albuterol (DUONEB) 0.5-2.5 (3) MG/3ML SOLN Take 3 mLs by nebulization every 4 (four) hours as needed for up to 20 days. 04/05/18 04/25/18  Arta Silence, MD  montelukast (SINGULAIR) 10 MG tablet Take 1 tablet (10 mg total) by mouth at bedtime. 08/16/16   Frederich Cha, MD  predniSONE (DELTASONE) 20 MG tablet Take 2 tabs PO x 5 days and 1 tab PO x 2 days 01/22/18   Danton Clap, PA-C  predniSONE (DELTASONE) 20 MG tablet Take 60 mg (3 tabs) for 2 days starting tomorrow, then 40 mg (2 tabs) for 3 days, then 20 mg (1 tab) for 3 days 04/05/18   Arta Silence, MD  ranitidine (ZANTAC) 300 MG tablet Take 300 mg by mouth. 03/29/17 03/29/18  [provider]  sertraline (ZOLOFT) 50 MG tablet Take 1 tablet (50 mg total)  by mouth daily. 01/11/16   Norval Gable, MD  Spacer/Aero-Holding Chambers (AEROCHAMBER PLUS) inhaler Use as instructed 03/09/17   Melynda Ripple, MD    Allergies Patient has no known allergies.  Family History  Problem Relation Age of Onset  . Asthma Mother   . Gout Father     Social History Social History   Tobacco Use  . Smoking status: Former Smoker    Types: Cigarettes    Last attempt to quit: 12/23/2017    Years since quitting: 0.2  . Smokeless tobacco: Never Used  Substance Use Topics  . Alcohol use: No  . Drug use: No    Review of Systems  Constitutional: No fever. Eyes: No redness. ENT: Positive for sore throat. Cardiovascular: Denies chest pain. Respiratory: Positive for shortness of breath. Gastrointestinal: No vomiting or diarrhea.  Genitourinary: Negative for flank pain.  Musculoskeletal: Negative for back pain. Skin: Negative for rash. Neurological: Negative for headache.   ____________________________________________   PHYSICAL EXAM:  VITAL SIGNS: ED Triage Vitals [04/05/18 0833]  Enc Vitals Group     BP (!) 157/96     Pulse Rate 89     Resp 18     Temp 98.1 F (36.7 C)     Temp Source Oral     SpO2 96 %     Weight 229 lb (103.9 kg)     Height 5\' 4"  (1.626 m)     Head Circumference      Peak Flow      Pain Score 6     Pain Loc      Pain Edu?      Excl. in [place]?     Constitutional: Alert and oriented.  Relatively well appearing and in no acute distress. Eyes: Conjunctivae are normal.  Head: Atraumatic. Nose: No congestion/rhinnorhea. Mouth/Throat: Mucous membranes are moist.  Oropharynx clear with slight erythema but no exudates or swelling. Neck: Normal range of motion.  Cardiovascular: Normal rate, regular rhythm. Grossly normal heart sounds.  Good peripheral circulation. Respiratory: Normal respiratory effort.  No retractions.  Diffuse wheezing bilaterally with good air entry. Gastrointestinal: No distention.    Musculoskeletal:  Extremities warm and well perfused.  Neurologic:  Normal speech and language. No gross focal neurologic deficits are appreciated.  Skin:  Skin is warm and dry. No rash noted. Psychiatric: Mood and affect are normal. Speech and behavior are normal.  ____________________________________________   LABS (all labs ordered are listed, but only abnormal results are displayed)  Labs Reviewed - No data to display ____________________________________________  EKG   ____________________________________________  RADIOLOGY    ____________________________________________   PROCEDURES  Procedure(s) performed: No  Procedures  Critical Care performed: No ____________________________________________   INITIAL IMPRESSION / ASSESSMENT AND PLAN / ED COURSE  Pertinent labs & imaging results that were available during my care of the patient were reviewed by me and considered in my medical decision making (see chart for details).  38 year old female with  a history of asthma and other PMH as noted above presents with shortness of breath, cough, and postnasal drip over the last several days with no fever or chest pain.  On exam the patient is overall well-appearing and her vital signs are normal except for hypertension.  She has diffuse wheezing to bilateral lungs but with good air entry.  Overall presentation is consistent with asthma exacerbation likely related to viral URI.  After discussing with the patient, I think there is no indication for a chest x-ray at this time given that she has had identical symptoms in the past, is afebrile, and has symmetrical wheezing.  There is no indication for lab work-up.  We will give prednisone, duo nebs, and reassess.  I anticipate discharge home.  ----------------------------------------- 10:55 AM on 04/05/2018 -----------------------------------------  The patient is feeling much better.  She feels well to go home.  The wheezing  is improved.  She is stable for discharge at this time.  Return precautions given, and she expressed understanding. ____________________________________________   FINAL CLINICAL IMPRESSION(S) / ED DIAGNOSES  Final diagnoses:  Exacerbation of asthma, unspecified asthma severity, unspecified whether persistent      NEW MEDICATIONS STARTED DURING THIS VISIT:  New Prescriptions   ALBUTEROL (PROVENTIL HFA;VENTOLIN HFA) 108 (90 BASE) MCG/ACT INHALER    Inhale 2 puffs into the lungs every 4 (four) hours as needed for wheezing or shortness of breath.   IPRATROPIUM-ALBUTEROL (DUONEB) 0.5-2.5 (3) MG/3ML SOLN    Take 3 mLs by nebulization every 4 (four) hours as needed for up to 20 days.   PREDNISONE (DELTASONE) 20 MG TABLET    Take 60 mg (3 tabs) for 2 days starting tomorrow, then 40 mg (2 tabs) for 3 days, then 20 mg (1 tab) for 3 days     Note:  This document was prepared using Dragon voice recognition software and may include unintentional dictation errors.    Arta Silence, MD 04/05/18 1055

## 2018-04-05 NOTE — Discharge Instructions (Signed)
Use the albuterol via inhaler or nebulizer every 4-6 hours for the next several days.  Take the prednisone as prescribed and finish the full course.  Return to the ER for new or worsening shortness of breath, fever, weakness, or any other new or worsening symptoms that concern you.

## 2018-05-15 ENCOUNTER — Ambulatory Visit
Admission: EM | Admit: 2018-05-15 | Discharge: 2018-05-15 | Disposition: A | Discharge status: Home / Self Care | Payer: 59 | Attending: Family Medicine | Admitting: Family Medicine

## 2018-05-15 DIAGNOSIS — J4521 Mild intermittent asthma with (acute) exacerbation: Secondary | ICD-10-CM | POA: Diagnosis not present

## 2018-05-15 DIAGNOSIS — J45901 Unspecified asthma with (acute) exacerbation: Secondary | ICD-10-CM

## 2018-05-15 MED ORDER — IPRATROPIUM-ALBUTEROL 0.5-2.5 (3) MG/3ML IN SOLN
3.0000 mL | Freq: Once | RESPIRATORY_TRACT | Status: AC
  Administered 2018-05-15: 3 mL via RESPIRATORY_TRACT

## 2018-05-15 MED ORDER — METHYLPREDNISOLONE SODIUM SUCC 40 MG IJ SOLR
80.0000 mg | Freq: Once | INTRAMUSCULAR | Status: AC
  Administered 2018-05-15: 80 mg via INTRAMUSCULAR

## 2018-05-15 MED ORDER — ALBUTEROL SULFATE HFA 108 (90 BASE) MCG/ACT IN AERS: 1.0000 | INHALATION_SPRAY | Freq: Four times a day (QID) | RESPIRATORY_TRACT | 0 refills | Status: DC | PRN

## 2018-05-15 MED ORDER — AZITHROMYCIN 250 MG PO TABS: 250.0000 mg | ORAL_TABLET | Freq: Every day | ORAL | 0 refills | Status: DC

## 2018-05-15 MED ORDER — IPRATROPIUM-ALBUTEROL 0.5-2.5 (3) MG/3ML IN SOLN
3.0000 mL | Freq: Four times a day (QID) | RESPIRATORY_TRACT | Status: DC
  Administered 2018-05-15: 3 mL via RESPIRATORY_TRACT

## 2018-05-15 NOTE — Discharge Instructions (Addendum)
If you worsen go to the emergency room.

## 2018-05-15 NOTE — ED Triage Notes (Signed)
Pt reports cough, congestion, and SOB ongoing over last 3 days denies known fever or other symptoms

## 2018-05-15 NOTE — ED Provider Notes (Signed)
[facility]    CSN: [id number] Arrival date & time: 05/15/18  1311     History   Chief Complaint Chief Complaint  Patient presents with  . Shortness of Breath    HPI Sabrina Holder is a 38 y.o. female.   HPI  38 year old female presents with cough congestion shortness of breath is been going on over the last 3 days.  She states that she has been having this on and off for the last week or so.  Had no fever or other symptoms.  She has been using DuoNeb nebulizers albuterol inhalers.       Past Medical History:  Diagnosis Date  . Anxiety   . Asthma   . Environmental allergies   . Renal disorder     Patient Active Problem List   Diagnosis Date Noted  . Class 2 obesity due to excess calories with body mass index (BMI) of 39.0 to 39.9 in adult 11/05/2017  . Prediabetes 11/05/2017  . Moderate asthma with acute exacerbation 03/29/2017    Past Surgical History:  Procedure Laterality Date  . CESAREAN SECTION    . TUBAL LIGATION      OB History    Gravida  5   Para  5   Term  5   Preterm      AB      Living        SAB      TAB      Ectopic      Multiple      Live Births               Home Medications    Prior to Admission medications   Medication Sig Start Date End Date Taking? Authorizing Provider  albuterol (PROVENTIL HFA;VENTOLIN HFA) 108 (90 Base) MCG/ACT inhaler Inhale 2 puffs into the lungs every 4 (four) hours as needed for wheezing or shortness of breath. 04/19/17   Triplett, Dessa Phi, FNP  albuterol (PROVENTIL HFA;VENTOLIN HFA) 108 (90 Base) MCG/ACT inhaler Inhale 1-2 puffs q 4-6 hrs as needed x 30 days 01/22/18   Laurene Footman B, PA-C  albuterol (PROVENTIL HFA;VENTOLIN HFA) 108 (90 Base) MCG/ACT inhaler Inhale 2 puffs into the lungs every 4 (four) hours as needed for wheezing or shortness of breath. 04/05/18   Arta Silence, MD  albuterol (PROVENTIL HFA;VENTOLIN HFA) 108 (90 Base) MCG/ACT inhaler Inhale 1-2 puffs  into the lungs every 6 (six) hours as needed for wheezing or shortness of breath. Use with spacer 05/15/18   Lorin Picket, PA-C  albuterol (PROVENTIL) (2.5 MG/3ML) 0.083% nebulizer solution Take 3 mLs (2.5 mg total) by nebulization every 4 (four) hours as needed for wheezing or shortness of breath. 01/22/18 02/21/18  Laurene Footman B, PA-C  azithromycin (ZITHROMAX) 250 MG tablet Take 1 tablet (250 mg total) by mouth daily. Take first 2 tablets together, then 1 every day until finished. 05/15/18   Lorin Picket, PA-C  escitalopram (LEXAPRO) 20 MG tablet TAKE 1 TABLET BY MOUTH ONCE DAILY FOR MOOD 09/03/17   [provider]  fexofenadine-pseudoephedrine (ALLEGRA-D ALLERGY & CONGESTION) 180-240 MG 24 hr tablet Take 1 tablet by mouth daily. Patient not taking: Reported on 04/05/2018 02/24/17   Melynda Ripple, MD  fluticasone [facility]) 50 MCG/ACT nasal spray Place into the nose.    [provider]  fluticasone-salmeterol (ADVAIR HFA) 230-21 MCG/ACT inhaler Inhale 2 puffs into the lungs 2 (two) times daily.    [provider]  guaiFENesin-codeine 100-10  MG/5ML syrup Take 10 mLs by mouth 3 (three) times daily as needed. Patient not taking: Reported on 04/05/2018 04/19/17   Sherrie George B, FNP  ipratropium (ATROVENT) 0.06 % nasal spray Place 2 sprays into both nostrils 4 (four) times daily. 3-4 times/ day 02/24/17   Melynda Ripple, MD  ipratropium-albuterol (DUONEB) 0.5-2.5 (3) MG/3ML SOLN Take 3 mLs by nebulization every 6 (six) hours as needed. 02/24/17   Melynda Ripple, MD  ipratropium-albuterol (DUONEB) 0.5-2.5 (3) MG/3ML SOLN Take 3 mLs by nebulization every 4 (four) hours as needed for up to 20 days. 04/05/18 04/25/18  Arta Silence, MD  montelukast (SINGULAIR) 10 MG tablet Take 1 tablet (10 mg total) by mouth at bedtime. 08/16/16   Frederich Cha, MD  predniSONE (DELTASONE) 20 MG tablet Take 2 tabs PO x 5 days and 1 tab PO x 2 days Patient not taking: Reported on  04/05/2018 01/22/18   Danton Clap, PA-C  predniSONE (DELTASONE) 20 MG tablet Take 60 mg (3 tabs) for 2 days starting tomorrow, then 40 mg (2 tabs) for 3 days, then 20 mg (1 tab) for 3 days 04/05/18   Arta Silence, MD  ranitidine (ZANTAC) 300 MG tablet Take 300 mg by mouth. 03/29/17 03/29/18  [provider]  sertraline (ZOLOFT) 50 MG tablet Take 1 tablet (50 mg total) by mouth daily. Patient not taking: Reported on 04/05/2018 01/11/16   Norval Gable, MD  Spacer/Aero-Holding Chambers (AEROCHAMBER PLUS) inhaler Use as instructed 03/09/17   Melynda Ripple, MD    Family History Family History  Problem Relation Age of Onset  . Asthma Mother   . Gout Father     Social History Social History   Tobacco Use  . Smoking status: Former Smoker    Types: Cigarettes    Last attempt to quit: 12/23/2017    Years since quitting: 0.3  . Smokeless tobacco: Never Used  Substance Use Topics  . Alcohol use: No  . Drug use: No     Allergies   Patient has no known allergies.   Review of Systems Review of Systems  Constitutional: Positive for activity change. Negative for appetite change, chills, fatigue and fever.  HENT: Positive for congestion and postnasal drip.   Respiratory: Positive for cough, shortness of breath and wheezing. Negative for stridor.   All other systems reviewed and are negative.    Physical Exam Triage Vital Signs ED Triage Vitals [05/15/18 1323]  Enc Vitals Group     BP 125/77     Pulse Rate 97     Resp 18     Temp 99.5 F (37.5 C)     Temp Source Oral     SpO2 96 %     Weight 232 lb (105.2 kg)     Height 5\' 4"  (1.626 m)     Head Circumference      Peak Flow      Pain Score 5     Pain Loc      Pain Edu?      Excl. in [place]?    No data found.  Updated Vital Signs BP 125/77 (BP Location: Right Arm)   Pulse 97   Temp 99.5 F (37.5 C) (Oral)   Resp 18   Ht 5\' 4"  (1.626 m)   Wt 232 lb (105.2 kg)   SpO2 96%   BMI 39.82 kg/m   Visual  Acuity Right Eye Distance:   Left Eye Distance:   Bilateral Distance:    Right Eye Near:  Left Eye Near:    Bilateral Near:     Physical Exam Vitals signs and nursing note reviewed.  Constitutional:      General: She is not in acute distress.    Appearance: She is well-developed. She is not ill-appearing, toxic-appearing or diaphoretic.  HENT:     Head: Normocephalic.     Mouth/Throat:     Mouth: Mucous membranes are moist.     Pharynx: Oropharynx is clear. No pharyngeal swelling or oropharyngeal exudate.  Neck:     Musculoskeletal: Normal range of motion and neck supple.  Pulmonary:     Breath sounds: Wheezing present.  Musculoskeletal: Normal range of motion.  Lymphadenopathy:     Cervical: No cervical adenopathy.  Skin:    General: Skin is warm and dry.  Neurological:     General: No focal deficit present.     Mental Status: She is alert and oriented to person, place, and time.  Psychiatric:        Mood and Affect: Mood normal.        Behavior: Behavior normal.      UC Treatments / Results  Labs (all labs ordered are listed, but only abnormal results are displayed) Labs Reviewed - No data to display  EKG None  Radiology No results found.  Procedures Procedures (including critical care time)  Medications Ordered in UC Medications  ipratropium-albuterol (DUONEB) 0.5-2.5 (3) MG/3ML nebulizer solution 3 mL (3 mLs Nebulization Given 05/15/18 1344)  ipratropium-albuterol (DUONEB) 0.5-2.5 (3) MG/3ML nebulizer solution 3 mL (3 mLs Nebulization Given 05/15/18 1403)  methylPREDNISolone sodium succinate (SOLU-MEDROL) 40 mg/mL injection 80 mg (80 mg Intramuscular Given 05/15/18 1403)   Patient felt improved and had less wheezing following the second DuoNeb treatment Initial Impression / Assessment and Plan / UC Course  I have reviewed the triage vital signs and the nursing notes.  Pertinent labs & imaging results that were available during my care of the patient  were reviewed by me and considered in my medical decision making (see chart for details).   Patient has an exacerbation of her asthma.  With the amount of time that she has been having this over the last week.  She says this is normal for her during this time a year.  Did somewhat better after the second DuoNeb treatment. States that she is ready to go home.  Has DuoNeb at home as well as albuterol inhalers.  Have encouraged her to follow-up at [facility] next week for reevaluation possible adjustment of medications because of the exacerbations that she has been under going.  She worsens she needs to go the emergency room.   Final Clinical Impressions(s) / UC Diagnoses   Final diagnoses:  Mild intermittent asthma with exacerbation     Discharge Instructions     If you worsen go to the emergency room.   ED Prescriptions    Medication Sig Dispense Auth. Provider   albuterol (PROVENTIL HFA;VENTOLIN HFA) 108 (90 Base) MCG/ACT inhaler Inhale 1-2 puffs into the lungs every 6 (six) hours as needed for wheezing or shortness of breath. Use with spacer 1 Inhaler Lorin Picket, PA-C   azithromycin (ZITHROMAX) 250 MG tablet Take 1 tablet (250 mg total) by mouth daily. Take first 2 tablets together, then 1 every day until finished. 6 tablet Lorin Picket, PA-C     Controlled Substance Prescriptions [place] Controlled Substance Registry consulted? Not Applicable   Lorin Picket, PA-C 05/15/18 1827

## 2018-05-23 ENCOUNTER — Encounter: Payer: Self-pay | Admitting: Internal Medicine

## 2018-05-23 ENCOUNTER — Ambulatory Visit (INDEPENDENT_AMBULATORY_CARE_PROVIDER_SITE_OTHER): Payer: 59 | Admitting: Internal Medicine

## 2018-05-23 VITALS — BP 142/82 | HR 106 | Ht 64.0 in | Wt 230.0 lb

## 2018-05-23 DIAGNOSIS — J4541 Moderate persistent asthma with (acute) exacerbation: Secondary | ICD-10-CM

## 2018-05-23 MED ORDER — IPRATROPIUM-ALBUTEROL 0.5-2.5 (3) MG/3ML IN SOLN
3.0000 mL | Freq: Once | RESPIRATORY_TRACT | Status: AC
  Administered 2018-05-23: 3 mL via RESPIRATORY_TRACT

## 2018-05-23 MED ORDER — TIOTROPIUM BROMIDE MONOHYDRATE 1.25 MCG/ACT IN AERS: 2.0000 | INHALATION_SPRAY | Freq: Two times a day (BID) | RESPIRATORY_TRACT | 5 refills | Status: DC

## 2018-05-23 MED ORDER — PREDNISONE 20 MG PO TABS: 40.0000 mg | ORAL_TABLET | Freq: Every day | ORAL | 0 refills | Status: DC

## 2018-05-23 NOTE — Progress Notes (Signed)
Name: Veryl Abril MRN: [id number] DOB: 1980/10/11     CONSULTATION DATE: 05/23/2018  REFERRING MD : Juanda Crumble drew  CHIEF COMPLAINT: cough  STUDIES:    03/2017  CXR independently reviewed by Me   NO pneumonia No edema No effusions NL CXR   HISTORY OF PRESENT ILLNESS:  38 year old pleasant African-American female seen today for signs and symptoms of chronic bronchitis associated with chronic cough and mucus production over the last several years Associated with chronic nasal drainage with allergic rhinitis Associated with GERD on PPI  Over the last several months patient has been on several rounds of prednisone and several rounds of antibiotics  She has a diagnosis of asthma as a child Patient smokes 2 packs a day for the last 15 years is down to 1 cigarette a day now  Triggers include perfumes colognes cleaners cold air  She has had cats and dogs in the home which she has gotten rid of She takes Advair which helps She was prescribed albuterol but she cannot afford it    At this time patient is currently having an asthma exacerbation however is not in any acute distress She has extensive bilateral wheezing  She will need DuoNeb treatment in the office now And will need more prednisone to control her exacerbation  I have explained the findings to the patient and she understands   Smoking Assessment and Cessation Counseling   Upon further questioning, Patient smokes 1 ppd  I have advised patient to quit/stop smoking as soon as possible due to high risk for multiple medical problems  Patient is willing to quit smoking  I have advised patient that we can assist and have options of Nicotine replacement therapy. I also advised patient on behavioral therapy and can provide oral medication therapy in conjunction with the other therapies  Follow up next Office visit  for assessment of smoking cessation  Smoking cessation counseling advised for 4  minutes    PAST MEDICAL HISTORY :   has a past medical history of Anxiety, Asthma, Environmental allergies, and Renal disorder.  has a past surgical history that includes Cesarean section and Tubal ligation. Prior to Admission medications   Medication Sig Start Date End Date Taking? Authorizing Provider  albuterol (PROVENTIL HFA;VENTOLIN HFA) 108 (90 Base) MCG/ACT inhaler Inhale 2 puffs into the lungs every 4 (four) hours as needed for wheezing or shortness of breath. 04/19/17  Yes Triplett, Cari B, FNP  albuterol (PROVENTIL HFA;VENTOLIN HFA) 108 (90 Base) MCG/ACT inhaler Inhale 1-2 puffs q 4-6 hrs as needed x 30 days 01/22/18  Yes Laurene Footman B, PA-C  albuterol (PROVENTIL HFA;VENTOLIN HFA) 108 (90 Base) MCG/ACT inhaler Inhale 2 puffs into the lungs every 4 (four) hours as needed for wheezing or shortness of breath. 04/05/18  Yes Arta Silence, MD  albuterol (PROVENTIL HFA;VENTOLIN HFA) 108 (90 Base) MCG/ACT inhaler Inhale 1-2 puffs into the lungs every 6 (six) hours as needed for wheezing or shortness of breath. Use with spacer 05/15/18  Yes Crecencio Mc P, PA-C  azithromycin (ZITHROMAX) 250 MG tablet Take 1 tablet (250 mg total) by mouth daily. Take first 2 tablets together, then 1 every day until finished. 05/15/18  Yes Crecencio Mc P, PA-C  escitalopram (LEXAPRO) 20 MG tablet TAKE 1 TABLET BY MOUTH ONCE DAILY FOR MOOD 09/03/17  Yes [provider]  fexofenadine-pseudoephedrine (ALLEGRA-D ALLERGY & CONGESTION) 180-240 MG 24 hr tablet Take 1 tablet by mouth daily. 02/24/17  Yes Melynda Ripple, MD  fluticasone [facility]) 50  MCG/ACT nasal spray Place into the nose.   Yes [provider]  fluticasone-salmeterol (ADVAIR HFA) 230-21 MCG/ACT inhaler Inhale 2 puffs into the lungs 2 (two) times daily.   Yes [provider]  ipratropium (ATROVENT) 0.06 % nasal spray Place 2 sprays into both nostrils 4 (four) times daily. 3-4 times/ day 02/24/17  Yes Melynda Ripple,  MD  ipratropium-albuterol (DUONEB) 0.5-2.5 (3) MG/3ML SOLN Take 3 mLs by nebulization every 6 (six) hours as needed. 02/24/17  Yes Melynda Ripple, MD  montelukast (SINGULAIR) 10 MG tablet Take 1 tablet (10 mg total) by mouth at bedtime. 08/16/16  Yes Frederich Cha, MD  predniSONE (DELTASONE) 20 MG tablet Take 2 tabs PO x 5 days and 1 tab PO x 2 days 01/22/18  Yes Laurene Footman B, PA-C  predniSONE (DELTASONE) 20 MG tablet Take 60 mg (3 tabs) for 2 days starting tomorrow, then 40 mg (2 tabs) for 3 days, then 20 mg (1 tab) for 3 days 04/05/18  Yes Arta Silence, MD  sertraline (ZOLOFT) 50 MG tablet Take 1 tablet (50 mg total) by mouth daily. 01/11/16  Yes Norval Gable, MD  Spacer/Aero-Holding Chambers (AEROCHAMBER PLUS) inhaler Use as instructed 03/09/17  Yes Melynda Ripple, MD  albuterol (PROVENTIL) (2.5 MG/3ML) 0.083% nebulizer solution Take 3 mLs (2.5 mg total) by nebulization every 4 (four) hours as needed for wheezing or shortness of breath. 01/22/18 02/21/18  Danton Clap, PA-C  ipratropium-albuterol (DUONEB) 0.5-2.5 (3) MG/3ML SOLN Take 3 mLs by nebulization every 4 (four) hours as needed for up to 20 days. 04/05/18 04/25/18  Arta Silence, MD  ranitidine (ZANTAC) 300 MG tablet Take 300 mg by mouth. 03/29/17 03/29/18  [provider]   No Known Allergies  FAMILY HISTORY:  family history includes Asthma in her mother; Gout in her father. SOCIAL HISTORY:  reports that she quit smoking about 4 months ago. Her smoking use included cigarettes. She has never used smokeless tobacco. She reports that she does not drink alcohol or use drugs.  REVIEW OF SYSTEMS:   Constitutional: Negative for fever, chills, weight loss, malaise/fatigue and diaphoresis.  HENT: Negative for hearing loss, ear pain, nosebleeds, congestion, sore throat, neck pain, tinnitus and ear discharge.   Eyes: Negative for blurred vision, double vision, photophobia, pain, discharge and redness.  Respiratory:  + Cough, hemoptysis,-+ sputum production, + shortness of breath, + wheezing and-stridor.   Cardiovascular: Negative for chest pain, palpitations, orthopnea, claudication, leg swelling and PND.  Gastrointestinal: Negative for heartburn, nausea, vomiting, abdominal pain, diarrhea, constipation, blood in stool and melena.  Genitourinary: Negative for dysuria, urgency, frequency, hematuria and flank pain.  Musculoskeletal: Negative for myalgias, back pain, joint pain and falls.  Skin: Negative for itching and rash.  Neurological: Negative for dizziness, tingling, tremors, sensory change, speech change, focal weakness, seizures, loss of consciousness, weakness and headaches.  Endo/Heme/Allergies: Negative for environmental allergies and polydipsia. Does not bruise/bleed easily.  ALL OTHER ROS ARE NEGATIVE   BP (!) 142/82 (BP Location: Left Arm, Cuff Size: Normal)   Pulse (!) 106   Ht 5\' 4"  (1.626 m)   Wt 230 lb (104.3 kg)   SpO2 94%   BMI 39.48 kg/m    Physical Examination:   GENERAL:NAD, no fevers, chills, no weakness no fatigue HEAD: Normocephalic, atraumatic.  EYES: Pupils equal, round, reactive to light. Extraocular muscles intact. No scleral icterus.  MOUTH: Moist mucosal membrane.   EAR, NOSE, THROAT: Clear without exudates. No external lesions.  NECK: Supple. No thyromegaly. No nodules.  No JVD.  PULMONARY:CTA B/L + wheezes, + rhonchi CARDIOVASCULAR: S1 and S2. Regular rate and rhythm. No murmurs, rubs, or gallops. No edema.  GASTROINTESTINAL: Soft, nontender, nondistended. No masses. Positive bowel sounds.  MUSCULOSKELETAL: No swelling, clubbing, or edema. Range of motion full in all extremities.  NEUROLOGIC: Cranial nerves II through XII are intact. No gross focal neurological deficits.  SKIN: No ulceration, lesions, rashes, or cyanosis. Skin warm and dry. Turgor intact.  PSYCHIATRIC: Mood, affect within normal limits. The patient is awake, alert and oriented x 3. Insight,  judgment intact.      ASSESSMENT / PLAN: 38 year old pleasant African-American female with moderate persistent asthma with acute asthma exacerbation likely related to viral bronchitis as well as ongoing smoking exposure in the setting of reflux disease with chronic allergic rhinitis also associated with obesity and deconditioned state  Acute asthma exacerbation Patient has completed antibiotic therapy last week Patient needs prednisone 40 mg daily for the next 10 days Avoidance of triggers and exposures is highly recommended I have advised duo nebs every 4 hours for the next 3 days and then every 4 hours as needed afterwards   Smoking cessation strongly advised    Moderate persistent asthma Continue Advair as prescribed We will add Spiriva Respimat 2 therapy Avoidance of allergens Albuterol as needed 2 to 4 puffs every 4 hours  Obesity -recommend significant weight loss -recommend changing diet  Deconditioned state -Recommend increased daily activity and exercise  GERD continue PPI   Patient/Family are satisfied with Plan of action and management. All questions answered Follow-up in 3 months    Crystol Walpole Patricia Pesa, M.D.  [facility]  Medical Director [place] Director [facility] Cardio-Pulmonary Department

## 2018-05-23 NOTE — Addendum Note (Signed)
Addended by: Darreld Mclean on: 05/23/2018 11:01 AM   Modules accepted: Orders

## 2018-05-23 NOTE — Patient Instructions (Signed)
Start prednisone 40 mg daily for 10 days  Albuterol 2 to 4 puffs every 4 hours as needed  Advised to take duo nebs every 4 hours scheduled for the next 3 days and then as needed every 4 hours for wheezing and shortness of breath and cough   Continue medicines for GERD Continue medicines for allergic rhinitis

## 2018-06-23 ENCOUNTER — Emergency Department
Admission: EM | Admit: 2018-06-23 | Discharge: 2018-06-23 | Disposition: A | Discharge status: Home / Self Care | Payer: 59 | Attending: Emergency Medicine | Admitting: Emergency Medicine

## 2018-06-23 ENCOUNTER — Other Ambulatory Visit: Payer: Self-pay

## 2018-06-23 ENCOUNTER — Encounter: Payer: Self-pay | Admitting: Emergency Medicine

## 2018-06-23 DIAGNOSIS — Z79899 Other long term (current) drug therapy: Secondary | ICD-10-CM | POA: Insufficient documentation

## 2018-06-23 DIAGNOSIS — J45901 Unspecified asthma with (acute) exacerbation: Secondary | ICD-10-CM | POA: Insufficient documentation

## 2018-06-23 DIAGNOSIS — Z87891 Personal history of nicotine dependence: Secondary | ICD-10-CM | POA: Diagnosis not present

## 2018-06-23 DIAGNOSIS — R05 Cough: Secondary | ICD-10-CM | POA: Diagnosis present

## 2018-06-23 MED ORDER — PREDNISONE 50 MG PO TABS: 50.0000 mg | ORAL_TABLET | Freq: Every day | ORAL | 0 refills | Status: DC

## 2018-06-23 NOTE — ED Notes (Signed)
Patient states she wants her breathing checked out. Hx of asthma.  Denies travel or exposure to anyone sick.  Alert and oriented.  VSS.  NAD.

## 2018-06-23 NOTE — ED Triage Notes (Signed)
Cough x 2 days. Denies fever. resp unlabored.

## 2018-06-23 NOTE — ED Provider Notes (Signed)
[facility] Emergency Department Provider Note   ____________________________________________    I have reviewed the triage vital signs and the nursing notes.   HISTORY  Chief Complaint Cough     HPI Sabrina Holder is a 38 y.o. female who presents with complaints of cough and chest tightness.  Patient attributes this to an asthma exacerbation.  She has used nebulizers at home with only minimal improvement.  She denies recent travel.  No fevers or chills.  No calf pain or swelling.   Past Medical History:  Diagnosis Date  . Anxiety   . Asthma   . Environmental allergies   . Renal disorder     Patient Active Problem List   Diagnosis Date Noted  . Class 2 obesity due to excess calories with body mass index (BMI) of 39.0 to 39.9 in adult 11/05/2017  . Prediabetes 11/05/2017  . Moderate asthma with acute exacerbation 03/29/2017    Past Surgical History:  Procedure Laterality Date  . CESAREAN SECTION    . TUBAL LIGATION      Prior to Admission medications   Medication Sig Start Date End Date Taking? Authorizing Provider  albuterol (PROVENTIL HFA;VENTOLIN HFA) 108 (90 Base) MCG/ACT inhaler Inhale 2 puffs into the lungs every 4 (four) hours as needed for wheezing or shortness of breath. 04/19/17   Triplett, Dessa Phi, FNP  albuterol (PROVENTIL HFA;VENTOLIN HFA) 108 (90 Base) MCG/ACT inhaler Inhale 1-2 puffs q 4-6 hrs as needed x 30 days 01/22/18   Laurene Footman B, PA-C  albuterol (PROVENTIL HFA;VENTOLIN HFA) 108 (90 Base) MCG/ACT inhaler Inhale 2 puffs into the lungs every 4 (four) hours as needed for wheezing or shortness of breath. 04/05/18   Arta Silence, MD  albuterol (PROVENTIL HFA;VENTOLIN HFA) 108 (90 Base) MCG/ACT inhaler Inhale 1-2 puffs into the lungs every 6 (six) hours as needed for wheezing or shortness of breath. Use with spacer 05/15/18   Lorin Picket, PA-C  albuterol (PROVENTIL) (2.5 MG/3ML) 0.083% nebulizer solution Take  3 mLs (2.5 mg total) by nebulization every 4 (four) hours as needed for wheezing or shortness of breath. 01/22/18 02/21/18  Laurene Footman B, PA-C  azithromycin (ZITHROMAX) 250 MG tablet Take 1 tablet (250 mg total) by mouth daily. Take first 2 tablets together, then 1 every day until finished. 05/15/18   Lorin Picket, PA-C  escitalopram (LEXAPRO) 20 MG tablet TAKE 1 TABLET BY MOUTH ONCE DAILY FOR MOOD 09/03/17   [provider]  fexofenadine-pseudoephedrine (ALLEGRA-D ALLERGY & CONGESTION) 180-240 MG 24 hr tablet Take 1 tablet by mouth daily. 02/24/17   Melynda Ripple, MD  fluticasone (FLONASE) 50 MCG/ACT nasal spray Place into the nose.    [provider]  fluticasone-salmeterol (ADVAIR HFA) 230-21 MCG/ACT inhaler Inhale 2 puffs into the lungs 2 (two) times daily.    [provider]  ipratropium (ATROVENT) 0.06 % nasal spray Place 2 sprays into both nostrils 4 (four) times daily. 3-4 times/ day 02/24/17   Melynda Ripple, MD  ipratropium-albuterol (DUONEB) 0.5-2.5 (3) MG/3ML SOLN Take 3 mLs by nebulization every 6 (six) hours as needed. 02/24/17   Melynda Ripple, MD  ipratropium-albuterol (DUONEB) 0.5-2.5 (3) MG/3ML SOLN Take 3 mLs by nebulization every 4 (four) hours as needed for up to 20 days. 04/05/18 04/25/18  Arta Silence, MD  montelukast (SINGULAIR) 10 MG tablet Take 1 tablet (10 mg total) by mouth at bedtime. 08/16/16   Frederich Cha, MD  predniSONE (DELTASONE) 50 MG tablet Take 1 tablet (  50 mg total) by mouth daily with breakfast. 06/23/18   Lavonia Drafts, MD  ranitidine (ZANTAC) 300 MG tablet Take 300 mg by mouth. 03/29/17 03/29/18  [provider]  sertraline (ZOLOFT) 50 MG tablet Take 1 tablet (50 mg total) by mouth daily. 01/11/16   Norval Gable, MD  Spacer/Aero-Holding Chambers (AEROCHAMBER PLUS) inhaler Use as instructed 03/09/17   Melynda Ripple, MD  Tiotropium Bromide Monohydrate (SPIRIVA RESPIMAT) 1.25 MCG/ACT AERS Inhale 2 puffs  into the lungs 2 (two) times daily. 05/23/18   Flora Lipps, MD     Allergies Patient has no known allergies.  Family History  Problem Relation Age of Onset  . Asthma Mother   . Gout Father     Social History Social History   Tobacco Use  . Smoking status: Former Smoker    Types: Cigarettes    Last attempt to quit: 12/23/2017    Years since quitting: 0.4  . Smokeless tobacco: Never Used  . Tobacco comment: smokes one cigarette occassionally  Substance Use Topics  . Alcohol use: No  . Drug use: No    Review of Systems  Constitutional: No fever/chills Eyes: No visual changes.  ENT: No sore throat. Cardiovascular: Mild chest tightness Respiratory: As above Gastrointestinal: No abdominal pain Genitourinary: Negative for dysuria. Musculoskeletal: Negative for back pain. Skin: Negative for rash. Neurological: Negative for headaches   ____________________________________________   PHYSICAL EXAM:  VITAL SIGNS: ED Triage Vitals  Enc Vitals Group     BP 06/23/18 1007 (!) 139/94     Pulse Rate 06/23/18 1007 92     Resp 06/23/18 1007 20     Temp 06/23/18 1007 98.8 F (37.1 C)     Temp Source 06/23/18 1007 Oral     SpO2 06/23/18 1007 97 %     Weight 06/23/18 1008 108.9 kg (240 lb)     Height 06/23/18 1008 1.626 m (5\' 4" )     Head Circumference --      Peak Flow --      Pain Score 06/23/18 1020 0     Pain Loc --      Pain Edu? --      Excl. in [place]? --     Constitutional: Alert and oriented.  Eyes: Conjunctivae are normal.  Head: Atraumatic. Nose: No congestion/rhinnorhea. Mouth/Throat: Mucous membranes are moist.    Cardiovascular: Normal rate, regular rhythm. Grossly normal heart sounds.  Good peripheral circulation. Respiratory: Normal respiratory effort.  No retractions.  Scattered mild wheezing Gastrointestinal: Soft and nontender. No distention.    Musculoskeletal: No lower extremity tenderness nor edema.  Warm and well perfused Neurologic:  Normal  speech and language. No gross focal neurologic deficits are appreciated.  Skin:  Skin is warm, dry and intact. No rash noted. Psychiatric: Mood and affect are normal. Speech and behavior are normal.  ____________________________________________   LABS (all labs ordered are listed, but only abnormal results are displayed)  Labs Reviewed - No data to display ____________________________________________  EKG  None ____________________________________________  RADIOLOGY  None ____________________________________________   PROCEDURES  Procedure(s) performed: No  Procedures   Critical Care performed: No ____________________________________________   INITIAL IMPRESSION / ASSESSMENT AND PLAN / ED COURSE  Pertinent labs & imaging results that were available during my care of the patient were reviewed by me and considered in my medical decision making (see chart for details).  Patient well-appearing in no acute distress, vital signs are quite reassuring, lung exam demonstrates mild scattered wheezes consistent with mild asthma  exacerbation.  I have recommended continuing nebulizers at home and we will add p.o. prednisone for 5 days.  We discussed return precautions and the patient agrees.  Work note provided    ____________________________________________   FINAL CLINICAL IMPRESSION(S) / ED DIAGNOSES  Final diagnoses:  Moderate asthma with exacerbation, unspecified whether persistent        Note:  This document was prepared using Dragon voice recognition software and may include unintentional dictation errors.   Lavonia Drafts, MD 06/23/18 1438

## 2018-06-30 ENCOUNTER — Telehealth: Payer: Self-pay | Admitting: Internal Medicine

## 2018-06-30 NOTE — Telephone Encounter (Signed)
Called and spoke to pt.  Pt is requesting a letter stating that she is high risk for disability. I have advised pt that disability is handled thru [employer].  Pt has been provided with [employer]'s contact number.  Nothing further is needed at this time.

## 2018-07-16 ENCOUNTER — Other Ambulatory Visit: Payer: Self-pay | Admitting: Internal Medicine

## 2018-07-16 DIAGNOSIS — J4541 Moderate persistent asthma with (acute) exacerbation: Secondary | ICD-10-CM

## 2018-07-21 ENCOUNTER — Ambulatory Visit (INDEPENDENT_AMBULATORY_CARE_PROVIDER_SITE_OTHER): Payer: 59 | Admitting: Internal Medicine

## 2018-07-21 DIAGNOSIS — J4541 Moderate persistent asthma with (acute) exacerbation: Secondary | ICD-10-CM

## 2018-07-21 DIAGNOSIS — J4551 Severe persistent asthma with (acute) exacerbation: Secondary | ICD-10-CM | POA: Diagnosis not present

## 2018-07-21 MED ORDER — PREDNISONE 10 MG (21) PO TBPK: ORAL_TABLET | ORAL | 0 refills | Status: DC

## 2018-07-21 NOTE — Progress Notes (Signed)
[facility]    Assessment and Plan:  Acute asthma exacerbation. --already on maximal therapy. Recurrent exacerbations.  --Will give course of steroids.  --Asked to move pet from bedroom.  --May benefit from biologic agent as previous eos count elevated at 800.   Date: 07/21/2018  MRN# [id number] Kloie Whiting 01/12/1981    Sabrina Holder is a 38 y.o. old female seen in consultation for chief complaint of: asthma    HPI:   The patient is a 38 year old female who presents with chronic cough and excess mucus production over several years.  Patient has noted to have a history of chronic nasal drainage with allergic rhinitis and GERD on PPI.  Over the past several months she has been on several rounds of prednisone and antibiotics.  She was seen by Dr. Mortimer Fries on 05/23/2018 for asthma exacerbation.  She made a visit to the ED about 1 month ago with asthma exacerbation. She has been doing well since that time but then her breathing got worse again 3 weeks ago. She notes that her throat is itching and she has a lot of drainage. She has trouble breathing with exertion because of this.  She is not on prednisone currently, she is using albuterol frequently, advair bid, singulair, allegra-D.  She has a dog in bedroom.  She has reflux, controlled.    CXR 03/09/17>> imaging personally reviewed; unremarkable lungs.  CBC 03/09/18>> AEC 800.   Medication:    Current Outpatient Medications:  .  albuterol (PROVENTIL HFA;VENTOLIN HFA) 108 (90 Base) MCG/ACT inhaler, Inhale 2 puffs into the lungs every 4 (four) hours as needed for wheezing or shortness of breath., Disp: 1 Inhaler, Rfl: 1 .  albuterol (PROVENTIL HFA;VENTOLIN HFA) 108 (90 Base) MCG/ACT inhaler, Inhale 1-2 puffs q 4-6 hrs as needed x 30 days, Disp: 1 Inhaler, Rfl: 0 .  albuterol (PROVENTIL HFA;VENTOLIN HFA) 108 (90 Base) MCG/ACT inhaler, Inhale 2 puffs into the lungs every 4 (four) hours as needed for  wheezing or shortness of breath., Disp: 1 Inhaler, Rfl: 1 .  albuterol (PROVENTIL HFA;VENTOLIN HFA) 108 (90 Base) MCG/ACT inhaler, Inhale 1-2 puffs into the lungs every 6 (six) hours as needed for wheezing or shortness of breath. Use with spacer, Disp: 1 Inhaler, Rfl: 0 .  albuterol (PROVENTIL) (2.5 MG/3ML) 0.083% nebulizer solution, Take 3 mLs (2.5 mg total) by nebulization every 4 (four) hours as needed for wheezing or shortness of breath., Disp: 50 mL, Rfl: 0 .  azithromycin (ZITHROMAX) 250 MG tablet, Take 1 tablet (250 mg total) by mouth daily. Take first 2 tablets together, then 1 every day until finished., Disp: 6 tablet, Rfl: 0 .  escitalopram (LEXAPRO) 20 MG tablet, TAKE 1 TABLET BY MOUTH ONCE DAILY FOR MOOD, Disp: , Rfl: 1 .  fexofenadine-pseudoephedrine (ALLEGRA-D ALLERGY & CONGESTION) 180-240 MG 24 hr tablet, Take 1 tablet by mouth daily., Disp: 30 tablet, Rfl: 1 .  fluticasone (FLONASE) 50 MCG/ACT nasal spray, Place into the nose., Disp: , Rfl:  .  fluticasone-salmeterol (ADVAIR HFA) 230-21 MCG/ACT inhaler, Inhale 2 puffs into the lungs 2 (two) times daily., Disp: , Rfl:  .  ipratropium (ATROVENT) 0.06 % nasal spray, Place 2 sprays into both nostrils 4 (four) times daily. 3-4 times/ day, Disp: 15 mL, Rfl: 0 .  ipratropium-albuterol (DUONEB) 0.5-2.5 (3) MG/3ML SOLN, Take 3 mLs by nebulization every 6 (six) hours as needed., Disp: 360 mL, Rfl: 0 .  ipratropium-albuterol (DUONEB) 0.5-2.5 (3) MG/3ML SOLN, Take 3  mLs by nebulization every 4 (four) hours as needed for up to 20 days., Disp: 360 mL, Rfl: 0 .  montelukast (SINGULAIR) 10 MG tablet, Take 1 tablet (10 mg total) by mouth at bedtime., Disp: 30 tablet, Rfl: 1 .  predniSONE (DELTASONE) 50 MG tablet, Take 1 tablet (50 mg total) by mouth daily with breakfast., Disp: 5 tablet, Rfl: 0 .  ranitidine (ZANTAC) 300 MG tablet, Take 300 mg by mouth., Disp: , Rfl:  .  sertraline (ZOLOFT) 50 MG tablet, Take 1 tablet (50 mg total) by mouth daily.,  Disp: 20 tablet, Rfl: 0 .  Spacer/Aero-Holding Chambers (AEROCHAMBER PLUS) inhaler, Use as instructed, Disp: 1 each, Rfl: 2 .  SPIRIVA RESPIMAT 1.25 MCG/ACT AERS, TAKE 2 PUFFS BY MOUTH TWICE A DAY, Disp: 3 Inhaler, Rfl: 2   Allergies:  Patient has no known allergies.  Review of Systems:  Constitutional: Feels well. Cardiovascular: Denies chest pain, exertional chest pain.  Pulmonary: Denies hemoptysis, pleuritic chest pain.   The remainder of systems were reviewed and were found to be negative other than what is documented in the HPI.     Physical Examination:   -- Other findings:    LABORATORY PANEL:   CBC No results for input(s): WBC, HGB, HCT, PLT in the last 168 hours. ------------------------------------------------------------------------------------------------------------------  Chemistries  No results for input(s): NA, K, CL, CO2, GLUCOSE, BUN, CREATININE, CALCIUM, MG, AST, ALT, ALKPHOS, BILITOT in the last 168 hours.  Invalid input(s): GFRCGP ------------------------------------------------------------------------------------------------------------------  Cardiac Enzymes No results for input(s): TROPONINI in the last 168 hours. ------------------------------------------------------------  RADIOLOGY:  No results found.     Thank  you for the consultation and for allowing [facility] to assist in the care of your patient. Our recommendations are noted above.  Please contact us if we can be of further service.   Marda Stalker, M.D., F.C.C.P.  Board Certified in Internal Medicine, Pulmonary Medicine, [place], and Sleep Medicine.  [facility] Office Number: [phone]   07/21/2018

## 2018-07-30 ENCOUNTER — Ambulatory Visit: Payer: 59 | Admitting: Internal Medicine

## 2018-08-11 ENCOUNTER — Other Ambulatory Visit: Payer: Self-pay

## 2018-08-11 ENCOUNTER — Ambulatory Visit: Payer: 59 | Admitting: Internal Medicine

## 2018-08-11 NOTE — Progress Notes (Unsigned)
Name: Jurnie Garritano MRN: [id number] DOB: 26-Aug-1980     CONSULTATION DATE: 08/11/2018  REFERRING MD : Juanda Crumble drew  CHIEF COMPLAINT: cough  STUDIES:    03/2017  CXR independently reviewed by Me   NO pneumonia No edema No effusions NL CXR   HISTORY OF PRESENT ILLNESS:  38 year old pleasant African-American female seen today for signs and symptoms of chronic bronchitis associated with chronic cough and mucus production over the last several years Associated with chronic nasal drainage with allergic rhinitis Associated with GERD on PPI  Over the last several months patient has been on several rounds of prednisone and several rounds of antibiotics  She has a diagnosis of asthma as a child Patient smokes 2 packs a day for the last 15 years is down to 1 cigarette a day now  Triggers include perfumes colognes cleaners cold air  She has had cats and dogs in the home which she has gotten rid of She takes Advair which helps She was prescribed albuterol but she cannot afford it    At this time patient is currently having an asthma exacerbation however is not in any acute distress She has extensive bilateral wheezing  She will need DuoNeb treatment in the office now And will need more prednisone to control her exacerbation  I have explained the findings to the patient and she understands   Smoking Assessment and Cessation Counseling   Upon further questioning, Patient smokes 1 ppd  I have advised patient to quit/stop smoking as soon as possible due to high risk for multiple medical problems  Patient is willing to quit smoking  I have advised patient that we can assist and have options of Nicotine replacement therapy. I also advised patient on behavioral therapy and can provide oral medication therapy in conjunction with the other therapies  Follow up next Office visit  for assessment of smoking cessation  Smoking cessation counseling advised for 4 minutes    PAST MEDICAL HISTORY :   has a past medical history of Anxiety, Asthma, Environmental allergies, and Renal disorder.  has a past surgical history that includes Cesarean section and Tubal ligation. Prior to Admission medications   Medication Sig Start Date End Date Taking? Authorizing Provider  albuterol (PROVENTIL HFA;VENTOLIN HFA) 108 (90 Base) MCG/ACT inhaler Inhale 2 puffs into the lungs every 4 (four) hours as needed for wheezing or shortness of breath. 04/19/17  Yes Triplett, Cari B, FNP  albuterol (PROVENTIL HFA;VENTOLIN HFA) 108 (90 Base) MCG/ACT inhaler Inhale 1-2 puffs q 4-6 hrs as needed x 30 days 01/22/18  Yes Laurene Footman B, PA-C  albuterol (PROVENTIL HFA;VENTOLIN HFA) 108 (90 Base) MCG/ACT inhaler Inhale 2 puffs into the lungs every 4 (four) hours as needed for wheezing or shortness of breath. 04/05/18  Yes Arta Silence, MD  albuterol (PROVENTIL HFA;VENTOLIN HFA) 108 (90 Base) MCG/ACT inhaler Inhale 1-2 puffs into the lungs every 6 (six) hours as needed for wheezing or shortness of breath. Use with spacer 05/15/18  Yes Crecencio Mc P, PA-C  azithromycin (ZITHROMAX) 250 MG tablet Take 1 tablet (250 mg total) by mouth daily. Take first 2 tablets together, then 1 every day until finished. 05/15/18  Yes Crecencio Mc P, PA-C  escitalopram (LEXAPRO) 20 MG tablet TAKE 1 TABLET BY MOUTH ONCE DAILY FOR MOOD 09/03/17  Yes [provider]  fexofenadine-pseudoephedrine (ALLEGRA-D ALLERGY & CONGESTION) 180-240 MG 24 hr tablet Take 1 tablet by mouth daily. 02/24/17  Yes Melynda Ripple, MD  fluticasone (FLONASE) 50 MCG/ACT  nasal spray Place into the nose.   Yes [provider]  fluticasone-salmeterol (ADVAIR HFA) 230-21 MCG/ACT inhaler Inhale 2 puffs into the lungs 2 (two) times daily.   Yes [provider]  ipratropium (ATROVENT) 0.06 % nasal spray Place 2 sprays into both nostrils 4 (four) times daily. 3-4 times/ day 02/24/17  Yes Melynda Ripple, MD   ipratropium-albuterol (DUONEB) 0.5-2.5 (3) MG/3ML SOLN Take 3 mLs by nebulization every 6 (six) hours as needed. 02/24/17  Yes Melynda Ripple, MD  montelukast (SINGULAIR) 10 MG tablet Take 1 tablet (10 mg total) by mouth at bedtime. 08/16/16  Yes Frederich Cha, MD  predniSONE (DELTASONE) 20 MG tablet Take 2 tabs PO x 5 days and 1 tab PO x 2 days 01/22/18  Yes Laurene Footman B, PA-C  predniSONE (DELTASONE) 20 MG tablet Take 60 mg (3 tabs) for 2 days starting tomorrow, then 40 mg (2 tabs) for 3 days, then 20 mg (1 tab) for 3 days 04/05/18  Yes Arta Silence, MD  sertraline (ZOLOFT) 50 MG tablet Take 1 tablet (50 mg total) by mouth daily. 01/11/16  Yes Norval Gable, MD  Spacer/Aero-Holding Chambers (AEROCHAMBER PLUS) inhaler Use as instructed 03/09/17  Yes Melynda Ripple, MD  albuterol (PROVENTIL) (2.5 MG/3ML) 0.083% nebulizer solution Take 3 mLs (2.5 mg total) by nebulization every 4 (four) hours as needed for wheezing or shortness of breath. 01/22/18 02/21/18  Danton Clap, PA-C  ipratropium-albuterol (DUONEB) 0.5-2.5 (3) MG/3ML SOLN Take 3 mLs by nebulization every 4 (four) hours as needed for up to 20 days. 04/05/18 04/25/18  Arta Silence, MD  ranitidine (ZANTAC) 300 MG tablet Take 300 mg by mouth. 03/29/17 03/29/18  [provider]   No Known Allergies  FAMILY HISTORY:  family history includes Asthma in her mother; Gout in her father. SOCIAL HISTORY:  reports that she quit smoking about 7 months ago. Her smoking use included cigarettes. She has never used smokeless tobacco. She reports that she does not drink alcohol or use drugs.  REVIEW OF SYSTEMS:   Constitutional: Negative for fever, chills, weight loss, malaise/fatigue and diaphoresis.  HENT: Negative for hearing loss, ear pain, nosebleeds, congestion, sore throat, neck pain, tinnitus and ear discharge.   Eyes: Negative for blurred vision, double vision, photophobia, pain, discharge and redness.  Respiratory: +  Cough, hemoptysis,-+ sputum production, + shortness of breath, + wheezing and-stridor.   Cardiovascular: Negative for chest pain, palpitations, orthopnea, claudication, leg swelling and PND.  Gastrointestinal: Negative for heartburn, nausea, vomiting, abdominal pain, diarrhea, constipation, blood in stool and melena.  Genitourinary: Negative for dysuria, urgency, frequency, hematuria and flank pain.  Musculoskeletal: Negative for myalgias, back pain, joint pain and falls.  Skin: Negative for itching and rash.  Neurological: Negative for dizziness, tingling, tremors, sensory change, speech change, focal weakness, seizures, loss of consciousness, weakness and headaches.  Endo/Heme/Allergies: Negative for environmental allergies and polydipsia. Does not bruise/bleed easily.  ALL OTHER ROS ARE NEGATIVE   There were no vitals taken for this visit.   Physical Examination:   GENERAL:NAD, no fevers, chills, no weakness no fatigue HEAD: Normocephalic, atraumatic.  EYES: Pupils equal, round, reactive to light. Extraocular muscles intact. No scleral icterus.  MOUTH: Moist mucosal membrane.   EAR, NOSE, THROAT: Clear without exudates. No external lesions.  NECK: Supple. No thyromegaly. No nodules. No JVD.  PULMONARY:CTA B/L + wheezes, + rhonchi CARDIOVASCULAR: S1 and S2. Regular rate and rhythm. No murmurs, rubs, or gallops. No edema.  GASTROINTESTINAL: Soft, nontender, nondistended. No masses. Positive  bowel sounds.  MUSCULOSKELETAL: No swelling, clubbing, or edema. Range of motion full in all extremities.  NEUROLOGIC: Cranial nerves II through XII are intact. No gross focal neurological deficits.  SKIN: No ulceration, lesions, rashes, or cyanosis. Skin warm and dry. Turgor intact.  PSYCHIATRIC: Mood, affect within normal limits. The patient is awake, alert and oriented x 3. Insight, judgment intact.      ASSESSMENT / PLAN: 38 year old pleasant African-American female with moderate persistent  asthma with acute asthma exacerbation likely related to viral bronchitis as well as ongoing smoking exposure in the setting of reflux disease with chronic allergic rhinitis also associated with obesity and deconditioned state  Acute asthma exacerbation Patient has completed antibiotic therapy last week Patient needs prednisone 40 mg daily for the next 10 days Avoidance of triggers and exposures is highly recommended I have advised duo nebs every 4 hours for the next 3 days and then every 4 hours as needed afterwards   Smoking cessation strongly advised    Moderate persistent asthma Continue Advair as prescribed We will add Spiriva Respimat 2 therapy Avoidance of allergens Albuterol as needed 2 to 4 puffs every 4 hours  Obesity -recommend significant weight loss -recommend changing diet  Deconditioned state -Recommend increased daily activity and exercise  GERD continue PPI   Patient/Family are satisfied with Plan of action and management. All questions answered Follow-up in 3 months    Lenell Lama Patricia Pesa, M.D.  [facility]  Medical Director [place] Director [facility] Cardio-Pulmonary Department

## 2018-09-12 ENCOUNTER — Telehealth: Payer: Self-pay

## 2018-09-12 ENCOUNTER — Other Ambulatory Visit: Payer: 59

## 2018-09-12 DIAGNOSIS — Z20822 Contact with and (suspected) exposure to covid-19: Secondary | ICD-10-CM

## 2018-09-12 NOTE — Telephone Encounter (Signed)
Call received from [facility] from Freddy Finner NP. Pt needs COVID-19 testing. Call placed to patient. Appointment scheduled and order placed.  [facility] Office [phone] Fax [phone]

## 2018-09-14 LAB — NOVEL CORONAVIRUS, NAA: SARS-CoV-2, NAA: NOT DETECTED

## 2018-10-17 ENCOUNTER — Other Ambulatory Visit: Payer: Self-pay

## 2018-10-17 ENCOUNTER — Emergency Department: Payer: 59

## 2018-10-17 ENCOUNTER — Emergency Department
Admission: EM | Admit: 2018-10-17 | Discharge: 2018-10-17 | Disposition: A | Discharge status: Home / Self Care | Payer: 59 | Attending: Emergency Medicine | Admitting: Emergency Medicine

## 2018-10-17 DIAGNOSIS — Z20828 Contact with and (suspected) exposure to other viral communicable diseases: Secondary | ICD-10-CM | POA: Insufficient documentation

## 2018-10-17 DIAGNOSIS — Z79899 Other long term (current) drug therapy: Secondary | ICD-10-CM | POA: Insufficient documentation

## 2018-10-17 DIAGNOSIS — R0602 Shortness of breath: Secondary | ICD-10-CM | POA: Diagnosis present

## 2018-10-17 DIAGNOSIS — J4541 Moderate persistent asthma with (acute) exacerbation: Secondary | ICD-10-CM | POA: Diagnosis not present

## 2018-10-17 DIAGNOSIS — F17210 Nicotine dependence, cigarettes, uncomplicated: Secondary | ICD-10-CM | POA: Insufficient documentation

## 2018-10-17 LAB — BASIC METABOLIC PANEL
Anion gap: 8 (ref 5–15)
BUN: 10 mg/dL (ref 6–20)
CO2: 23 mmol/L (ref 22–32)
Calcium: 8.1 mg/dL — ABNORMAL LOW (ref 8.9–10.3)
Chloride: 107 mmol/L (ref 98–111)
Creatinine, Ser: 0.69 mg/dL (ref 0.44–1.00)
GFR calc Af Amer: >60 mL/min (ref >60)
GFR calc non Af Amer: >60 mL/min (ref >60)
Glucose, Bld: 124 mg/dL — ABNORMAL HIGH (ref 70–99)
Potassium: 3 mmol/L — ABNORMAL LOW (ref 3.5–5.1)
Sodium: 138 mmol/L (ref 135–145)

## 2018-10-17 LAB — CBC WITH DIFFERENTIAL/PLATELET
Abs Immature Granulocytes: 0.01 10*3/uL (ref 0.00–0.07)
Basophils Absolute: 0.1 10*3/uL (ref 0.0–0.1)
Basophils Relative: 1 %
Eosinophils Absolute: 0.4 10*3/uL (ref 0.0–0.5)
Eosinophils Relative: 5 %
HCT: 33.1 % — ABNORMAL LOW (ref 36.0–46.0)
Hemoglobin: 10.7 g/dL — ABNORMAL LOW (ref 12.0–15.0)
Immature Granulocytes: 0 %
Lymphocytes Relative: 23 %
Lymphs Abs: 1.6 10*3/uL (ref 0.7–4.0)
MCH: 27.4 pg (ref 26.0–34.0)
MCHC: 32.3 g/dL (ref 30.0–36.0)
MCV: 84.9 fL (ref 80.0–100.0)
Monocytes Absolute: 0.5 10*3/uL (ref 0.1–1.0)
Monocytes Relative: 8 %
Neutro Abs: 4.3 10*3/uL (ref 1.7–7.7)
Neutrophils Relative %: 63 %
Platelets: 389 10*3/uL (ref 150–400)
RBC: 3.9 MIL/uL (ref 3.87–5.11)
RDW: 13.9 % (ref 11.5–15.5)
WBC: 6.8 10*3/uL (ref 4.0–10.5)
nRBC: 0 % (ref 0.0–0.2)

## 2018-10-17 LAB — SARS CORONAVIRUS 2 BY RT PCR (HOSPITAL ORDER, PERFORMED IN ~~LOC~~ HOSPITAL LAB): SARS Coronavirus 2: NEGATIVE

## 2018-10-17 MED ORDER — SODIUM CHLORIDE 0.9 % IV BOLUS
1000.0000 mL | Freq: Once | INTRAVENOUS | Status: AC
  Administered 2018-10-17: 06:00:00 1000 mL via INTRAVENOUS

## 2018-10-17 MED ORDER — MAGNESIUM SULFATE 2 GM/50ML IV SOLN
2.0000 g | Freq: Once | INTRAVENOUS | Status: AC
  Administered 2018-10-17: 06:00:00 2 g via INTRAVENOUS
  Filled 2018-10-17: qty 50

## 2018-10-17 MED ORDER — IPRATROPIUM-ALBUTEROL 0.5-2.5 (3) MG/3ML IN SOLN
3.0000 mL | Freq: Once | RESPIRATORY_TRACT | Status: AC
  Administered 2018-10-17: 06:00:00 3 mL via RESPIRATORY_TRACT
  Filled 2018-10-17: qty 3

## 2018-10-17 MED ORDER — METHYLPREDNISOLONE SODIUM SUCC 125 MG IJ SOLR
125.0000 mg | Freq: Once | INTRAMUSCULAR | Status: AC
  Administered 2018-10-17: 06:00:00 125 mg via INTRAVENOUS
  Filled 2018-10-17: qty 2

## 2018-10-17 MED ORDER — PREDNISONE 10 MG PO TABS: ORAL_TABLET | ORAL | 0 refills | Status: DC

## 2018-10-17 NOTE — ED Provider Notes (Signed)
[facility] Emergency Department Provider Note  ____________________________________________   First MD Initiated Contact with Patient 10/17/18 [phone]     (approximate)  I have reviewed the triage vital signs and the nursing notes.   HISTORY  Chief Complaint Asthma    HPI Sabrina Holder is a 38 y.o. female with a strong history of asthma and who occasionally smokes.   Who presents by private vehicle with breath consistent with prior asthma attacks.  She said that she was tested for COVID-19 about 2 weeks ago and was negative.  She has felt her asthma symptoms bothering her and increasing in frequency over about the last week.  She says she was trying to treat herself at home and did not want to come to the vehicle by tonight it was severe.  She feels like her mouth is very dry and she has used at least 10 nebulizer treatments today.  She feels wheezing and the symptoms are worse when she exerts herself or lies flat.  She says she does have a significant allergic history as well and that may be contributing.  She denies fever, sore throat, chest pain (although she does have chest tightness), nausea, vomiting, and abdominal pain.  She has shortness of breath, wheezing, and cough.  She has not been around anyone who is tested positive for COVID-19.  Her breathing treatments help a little bit but did not last long.  The symptoms have been gradual in onset and are severe.        Past Medical History:  Diagnosis Date  . Anxiety   . Asthma   . Environmental allergies   . Renal disorder     Patient Active Problem List   Diagnosis Date Noted  . Class 2 obesity due to excess calories with body mass index (BMI) of 39.0 to 39.9 in adult 11/05/2017  . Prediabetes 11/05/2017  . Moderate asthma with acute exacerbation 03/29/2017    Past Surgical History:  Procedure Laterality Date  . CESAREAN SECTION    . TUBAL LIGATION      Prior to Admission medications    Medication Sig Start Date End Date Taking? Authorizing Provider  albuterol (PROVENTIL HFA;VENTOLIN HFA) 108 (90 Base) MCG/ACT inhaler Inhale 2 puffs into the lungs every 4 (four) hours as needed for wheezing or shortness of breath. 04/19/17   Triplett, Dessa Phi, FNP  albuterol (PROVENTIL HFA;VENTOLIN HFA) 108 (90 Base) MCG/ACT inhaler Inhale 1-2 puffs q 4-6 hrs as needed x 30 days 01/22/18   Laurene Footman B, PA-C  albuterol (PROVENTIL HFA;VENTOLIN HFA) 108 (90 Base) MCG/ACT inhaler Inhale 2 puffs into the lungs every 4 (four) hours as needed for wheezing or shortness of breath. 04/05/18   Arta Silence, MD  albuterol (PROVENTIL HFA;VENTOLIN HFA) 108 (90 Base) MCG/ACT inhaler Inhale 1-2 puffs into the lungs every 6 (six) hours as needed for wheezing or shortness of breath. Use with spacer 05/15/18   Lorin Picket, PA-C  albuterol (PROVENTIL) (2.5 MG/3ML) 0.083% nebulizer solution Take 3 mLs (2.5 mg total) by nebulization every 4 (four) hours as needed for wheezing or shortness of breath. 01/22/18 02/21/18  Laurene Footman B, PA-C  azithromycin (ZITHROMAX) 250 MG tablet Take 1 tablet (250 mg total) by mouth daily. Take first 2 tablets together, then 1 every day until finished. 05/15/18   Lorin Picket, PA-C  escitalopram (LEXAPRO) 20 MG tablet TAKE 1 TABLET BY MOUTH ONCE DAILY FOR MOOD 09/03/17   [provider]  fexofenadine-pseudoephedrine (  ALLEGRA-D ALLERGY & CONGESTION) 180-240 MG 24 hr tablet Take 1 tablet by mouth daily. 02/24/17   Melynda Ripple, MD  fluticasone (FLONASE) 50 MCG/ACT nasal spray Place into the nose.    [provider]  fluticasone-salmeterol (ADVAIR HFA) 230-21 MCG/ACT inhaler Inhale 2 puffs into the lungs 2 (two) times daily.    [provider]  ipratropium (ATROVENT) 0.06 % nasal spray Place 2 sprays into both nostrils 4 (four) times daily. 3-4 times/ day 02/24/17   Melynda Ripple, MD  ipratropium-albuterol (DUONEB) 0.5-2.5 (3) MG/3ML SOLN Take  3 mLs by nebulization every 6 (six) hours as needed. 02/24/17   Melynda Ripple, MD  ipratropium-albuterol (DUONEB) 0.5-2.5 (3) MG/3ML SOLN Take 3 mLs by nebulization every 4 (four) hours as needed for up to 20 days. 04/05/18 04/25/18  Arta Silence, MD  montelukast (SINGULAIR) 10 MG tablet Take 1 tablet (10 mg total) by mouth at bedtime. 08/16/16   Frederich Cha, MD  predniSONE (DELTASONE) 10 MG tablet Take 6 tabs (60 mg) PO x 3 days, then take 4 tabs (40 mg) PO x 3 days, then take 2 tabs (20 mg) PO x 3 days, then take 1 tab (10 mg) PO x 3 days, then take 1/2 tab (5 mg) PO x 4 days. 10/17/18   Hinda Kehr, MD  ranitidine (ZANTAC) 300 MG tablet Take 300 mg by mouth. 03/29/17 03/29/18  [provider]  sertraline (ZOLOFT) 50 MG tablet Take 1 tablet (50 mg total) by mouth daily. 01/11/16   Norval Gable, MD  Spacer/Aero-Holding Chambers (AEROCHAMBER PLUS) inhaler Use as instructed 03/09/17   Melynda Ripple, MD  SPIRIVA RESPIMAT 1.25 MCG/ACT AERS TAKE 2 PUFFS BY MOUTH TWICE A DAY 07/16/18   Flora Lipps, MD    Allergies Patient has no known allergies.  Family History  Problem Relation Age of Onset  . Asthma Mother   . Gout Father     Social History Social History   Tobacco Use  . Smoking status: Current Some Day Smoker    Types: Cigarettes    Last attempt to quit: 12/23/2017    Years since quitting: 0.8  . Smokeless tobacco: Never Used  . Tobacco comment: smokes one cigarette occassionally  Substance Use Topics  . Alcohol use: No  . Drug use: No    Review of Systems Constitutional: No fever/chills Eyes: No visual changes. ENT: No sore throat. Cardiovascular: Denies chest pain. Respiratory: Shortness of breath, wheezing, and cough consistent with asthma attack. Gastrointestinal: No abdominal pain.  No nausea, no vomiting.  No diarrhea.  No constipation. Genitourinary: Negative for dysuria. Musculoskeletal: Negative for neck pain.  Negative for back pain.  Integumentary: Negative for rash. Neurological: Negative for headaches, focal weakness or numbness.   ____________________________________________   PHYSICAL EXAM:  VITAL SIGNS: ED Triage Vitals [10/17/18 0541]  Enc Vitals Group     BP (!) 148/83     Pulse Rate (!) 112     Resp (!) 25     Temp 99.4 F (37.4 C)     Temp src      SpO2 93 %     Weight 108.9 kg (240 lb)     Height 1.626 m (5\' 4" )     Head Circumference      Peak Flow      Pain Score 0     Pain Loc      Pain Edu?      Excl. in [place]?     Constitutional: Alert and oriented.  Generally  well appearing, mild respiratory distress. Eyes: Conjunctivae are normal.  Head: Atraumatic. Nose: No congestion/rhinnorhea. Mouth/Throat: Mucous membranes are dry. Neck: No stridor.  No meningeal signs.   Cardiovascular: Normal rate, regular rhythm. Good peripheral circulation. Grossly normal heart sounds. Respiratory: Normal respiratory effort with no accessory muscle usage or retractions, but decreased air movement throughout with severe wheezing most notable on expiratory phase but also a little bit of expiratory wheezing as well. Gastrointestinal: Obese.  Soft and nontender. No distention.  Musculoskeletal: No lower extremity tenderness nor edema. No gross deformities of extremities. Neurologic:  Normal speech and language. No gross focal neurologic deficits are appreciated.  Skin:  Skin is warm, dry and intact. No rash noted. Psychiatric: Mood and affect are normal. Speech and behavior are normal.  ____________________________________________   LABS (all labs ordered are listed, but only abnormal results are displayed)  Labs Reviewed  CBC WITH DIFFERENTIAL/PLATELET - Abnormal; Notable for the following components:      Result Value   Hemoglobin 10.7 (*)    HCT 33.1 (*)    All other components within normal limits  SARS CORONAVIRUS 2 (HOSPITAL ORDER, [place] [facility])  BASIC METABOLIC PANEL    ____________________________________________  EKG  No indication for EKG ____________________________________________  RADIOLOGY I, Hinda Kehr, personally viewed and evaluated these images (plain radiographs) as part of my medical decision making, as well as reviewing the written report by the radiologist.  ED MD interpretation: No evidence of acute disease  Official radiology report(s): Dg Chest Portable 1 View  Result Date: 10/17/2018 CLINICAL DATA:  Severe shortness of breath EXAM: PORTABLE CHEST 1 VIEW COMPARISON:  03/09/2017 FINDINGS: Normal heart size and mediastinal contours. No acute infiltrate or edema. No effusion or pneumothorax. No acute osseous findings. IMPRESSION: Negative chest. Electronically Signed   By: Monte Fantasia M.D.   On: 10/17/2018 06:23    ____________________________________________   PROCEDURES   Procedure(s) performed (including Critical Care):  Procedures   ____________________________________________   INITIAL IMPRESSION / MDM / [place] / ED COURSE  As part of my medical decision making, I reviewed the following data within the [place] notes reviewed and incorporated, Labs reviewed , Old chart reviewed, Radiograph reviewed  and Notes from prior ED visits   Differential diagnosis includes, but is not limited to, asthma exacerbation, COVID-19, pneumonia, PE.  I have a low suspicion for COVID-19.  The patient knows herself and her symptoms well and she says this is consistent with her asthma attacks.  She has been trying to treat her self at home but it has thus far not been successful.  I will go ahead and test her for COVID-19 but I will treat her aggressively with Solu-Medrol 125 mg IV, magnesium sulfate 2 g IV, and we will check basic lab work and a chest x-ray.  She is comfortable with the plan for 3 DuoNeb treatments that she controls to minimize staff movement in and out of the room until her  COVID-19 swab results are back.  She is also comfortable with the plan to reassess after her treatments and the COVID swab and she will be able to tell us whether or not she needs to come into the hospital or be treated as an outpatient.  Given her insensible losses and tachycardia, I am also giving 1 L normal saline IV.      Clinical Course as of Oct 16 657  Fri Oct 17, 2018  0658 The patient  is currently getting the breathing treatments.  Coronavirus swab is pending.  Basic metabolic panel hemolyzed but should only be necessary if the patient needs to be admitted.  She will be reassessed once her coronavirus swab is back and after her breathing treatments and once the Solu-Medrol has had a chance to work. Transferring ED care to Dr. Corky Downs to reassess and disposition as per the patient's preference based on her clinical status after treatment.   [CF]    Clinical Course User Index [CF] Hinda Kehr, MD     ____________________________________________  FINAL CLINICAL IMPRESSION(S) / ED DIAGNOSES  Final diagnoses:  Moderate persistent asthma with exacerbation     MEDICATIONS GIVEN DURING THIS VISIT:  Medications  magnesium sulfate IVPB 2 g 50 mL (2 g Intravenous New Bag/Given 10/17/18 0615)  sodium chloride 0.9 % bolus 1,000 mL (1,000 mLs Intravenous New Bag/Given 10/17/18 0618)  methylPREDNISolone sodium succinate (SOLU-MEDROL) 125 mg/2 mL injection 125 mg (125 mg Intravenous Given 10/17/18 0618)  ipratropium-albuterol (DUONEB) 0.5-2.5 (3) MG/3ML nebulizer solution 3 mL (3 mLs Nebulization Given 10/17/18 0622)  ipratropium-albuterol (DUONEB) 0.5-2.5 (3) MG/3ML nebulizer solution 3 mL (3 mLs Nebulization Given 10/17/18 0622)  ipratropium-albuterol (DUONEB) 0.5-2.5 (3) MG/3ML nebulizer solution 3 mL (3 mLs Nebulization Given 10/17/18 0622)     ED Discharge Orders         Ordered    predniSONE (DELTASONE) 10 MG tablet     10/17/18 2683          *Please note:  Markella Dao  was evaluated in Emergency Department on 10/17/2018 for the symptoms described in the history of present illness. She was evaluated in the context of the global COVID-19 pandemic, which necessitated consideration that the patient might be at risk for infection with the SARS-CoV-2 virus that causes COVID-19. Institutional protocols and algorithms that pertain to the evaluation of patients at risk for COVID-19 are in a state of rapid change based on information released by regulatory bodies including the CDC and federal and state organizations. These policies and algorithms were followed during the patient's care in the ED.  Some ED evaluations and interventions may be delayed as a result of limited staffing during the pandemic.*  Note:  This document was prepared using Dragon voice recognition software and may include unintentional dictation errors.   Hinda Kehr, MD 10/17/18 [phone]

## 2018-10-17 NOTE — Discharge Instructions (Signed)
We believe that your symptoms are caused today by an exacerbation of your asthma.  Please take the prescribed medications and any medications that you have at home.  Follow up with your doctor as recommended.  If you develop any new or worsening symptoms, including but not limited to fever, persistent vomiting, worsening shortness of breath, or other symptoms that concern you, please return to the Emergency Department immediately.  

## 2018-10-17 NOTE — ED Provider Notes (Signed)
Spoke with patient, she states she is feeling much better and is ready to go home.  She reports she has nebulizers at home and that she will return immediately if she starts to feel worse again.  She does request a prednisone prescription which will be provided.   Lavonia Drafts, MD 10/17/18 [phone]

## 2018-10-17 NOTE — ED Triage Notes (Signed)
Patient c/o asthma attack. Patient approximately 10 nebulizer treatments and multiple inhaler uses with no relief. Patient with labored breathing, wheezing throughout all fields.

## 2018-10-17 NOTE — ED Notes (Signed)
Pt alert and oriented. Unlabored, inspiratory and expiratory wheezing noted. Pt reports feeling much better than when arrived.

## 2018-10-17 NOTE — ED Notes (Signed)
Per md okay to wait to redraw lt green tube until pt done with neb treatments.

## 2018-10-20 ENCOUNTER — Telehealth: Payer: Self-pay | Admitting: Internal Medicine

## 2018-10-20 NOTE — Telephone Encounter (Signed)
Called patient for COVID-19 pre-screening for in office visit.  Have you recently traveled any where out of the local area in the last 2 weeks? no  Have you been in close contact with a person diagnosed with COVID-19 or someone awaiting results within the last 2 weeks? no  Do you currently have any of the following symptoms? If so, when did they start? Cough-x2wk    Diarrhea   Joint Pain Fever      Muscle Pain   Red eyes Shortness of breathx2wk   Abdominal pain  Vomiting Loss of smell    Rash    Sore Throat Headache    Weakness   Bruising or bleeding   Okay to proceed with visit. Switched to telephone visit due to new onset of sx.

## 2018-10-21 ENCOUNTER — Encounter: Payer: Self-pay | Admitting: Internal Medicine

## 2018-10-21 ENCOUNTER — Ambulatory Visit (INDEPENDENT_AMBULATORY_CARE_PROVIDER_SITE_OTHER): Payer: 59 | Admitting: Internal Medicine

## 2018-10-21 DIAGNOSIS — J4521 Mild intermittent asthma with (acute) exacerbation: Secondary | ICD-10-CM | POA: Diagnosis not present

## 2018-10-21 MED ORDER — PREDNISONE 20 MG PO TABS: 40.0000 mg | ORAL_TABLET | Freq: Every day | ORAL | 0 refills | Status: DC

## 2018-10-21 MED ORDER — ALBUTEROL SULFATE HFA 108 (90 BASE) MCG/ACT IN AERS: 2.0000 | INHALATION_SPRAY | RESPIRATORY_TRACT | 6 refills | Status: DC | PRN

## 2018-10-21 MED ORDER — AZITHROMYCIN 250 MG PO TABS: ORAL_TABLET | ORAL | 0 refills | Status: DC

## 2018-10-21 NOTE — Progress Notes (Addendum)
Name: Sabrina Holder MRN: [id number] DOB: Aug 15, 1980     CONSULTATION DATE: 10/21/2018  REFERRING MD : Juanda Crumble drew   I connected with the patient by telephone enabled telemedicine visit and verified that I am speaking with the correct person using two identifiers.    I discussed the limitations, risks, security and privacy concerns of performing an evaluation and management service by telemedicine and the availability of in-person appointments. I also discussed with the patient that there may be a patient responsible charge related to this service. The patient expressed understanding and agreed to proceed.  PATIENT AGREES AND CONFIRMS -YES   Other persons participating in the visit and their role in the encounter: Patient, nursing   Patient's location: Home Provider's location: Clinic   I discussed the limitations, risks, security and privacy concerns of performing an evaluation and management service by telephone and the availability of in person appointments. I also discussed with the patient that there may be a patient responsible charge related to this service. The patient expressed understanding and agreed to proceed.  This visit type was conducted due to national recommendations for restrictions regarding the COVID-19 Pandemic (e.g. social distancing).  This format is felt to be most appropriate for this patient at this time.  All issues noted in this document were discussed and addressed.         CHIEF COMPLAINT: cough  STUDIES:    03/2017  CXR independently reviewed by Me   NO pneumonia No edema No effusions NL CXR   HISTORY OF PRESENT ILLNESS: Follow up ASTHMA Still smoking Still with some cough and smoking  + signs of infection +need for ABX and steroids     Smoking Assessment and Cessation Counseling   Upon further questioning, Patient smokes 1/2 PPD  I have advised patient to quit/stop smoking as soon as possible due to high risk for  multiple medical problems  Patient is willing to quit smoking   I have advised patient that we can assist and have options of Nicotine replacement therapy. I also advised patient on behavioral therapy and can provide oral medication therapy in conjunction with the other therapies  Follow up next Office visit  for assessment of smoking cessation  Smoking cessation counseling advised for 4 minutes  Cannot take Chantix Taking Wellbutrin now    PAST MEDICAL HISTORY :   has a past medical history of Anxiety, Asthma, Environmental allergies, and Renal disorder.  has a past surgical history that includes Cesarean section and Tubal ligation. Prior to Admission medications   Medication Sig Start Date End Date Taking? Authorizing Provider  albuterol (PROVENTIL HFA;VENTOLIN HFA) 108 (90 Base) MCG/ACT inhaler Inhale 2 puffs into the lungs every 4 (four) hours as needed for wheezing or shortness of breath. 04/19/17  Yes Triplett, Cari B, FNP  albuterol (PROVENTIL HFA;VENTOLIN HFA) 108 (90 Base) MCG/ACT inhaler Inhale 1-2 puffs q 4-6 hrs as needed x 30 days 01/22/18  Yes Laurene Footman B, PA-C  albuterol (PROVENTIL HFA;VENTOLIN HFA) 108 (90 Base) MCG/ACT inhaler Inhale 2 puffs into the lungs every 4 (four) hours as needed for wheezing or shortness of breath. 04/05/18  Yes Arta Silence, MD  albuterol (PROVENTIL HFA;VENTOLIN HFA) 108 (90 Base) MCG/ACT inhaler Inhale 1-2 puffs into the lungs every 6 (six) hours as needed for wheezing or shortness of breath. Use with spacer 05/15/18  Yes Crecencio Mc P, PA-C  azithromycin (ZITHROMAX) 250 MG tablet Take 1 tablet (250 mg total) by mouth daily. Take first 2  tablets together, then 1 every day until finished. 05/15/18  Yes Crecencio Mc P, PA-C  escitalopram (LEXAPRO) 20 MG tablet TAKE 1 TABLET BY MOUTH ONCE DAILY FOR MOOD 09/03/17  Yes [provider]  fexofenadine-pseudoephedrine (ALLEGRA-D ALLERGY & CONGESTION) 180-240 MG 24 hr tablet Take 1  tablet by mouth daily. 02/24/17  Yes Melynda Ripple, MD  fluticasone (FLONASE) 50 MCG/ACT nasal spray Place into the nose.   Yes [provider]  fluticasone-salmeterol (ADVAIR HFA) 230-21 MCG/ACT inhaler Inhale 2 puffs into the lungs 2 (two) times daily.   Yes [provider]  ipratropium (ATROVENT) 0.06 % nasal spray Place 2 sprays into both nostrils 4 (four) times daily. 3-4 times/ day 02/24/17  Yes Melynda Ripple, MD  ipratropium-albuterol (DUONEB) 0.5-2.5 (3) MG/3ML SOLN Take 3 mLs by nebulization every 6 (six) hours as needed. 02/24/17  Yes Melynda Ripple, MD  montelukast (SINGULAIR) 10 MG tablet Take 1 tablet (10 mg total) by mouth at bedtime. 08/16/16  Yes Frederich Cha, MD  predniSONE (DELTASONE) 20 MG tablet Take 2 tabs PO x 5 days and 1 tab PO x 2 days 01/22/18  Yes Laurene Footman B, PA-C  predniSONE (DELTASONE) 20 MG tablet Take 60 mg (3 tabs) for 2 days starting tomorrow, then 40 mg (2 tabs) for 3 days, then 20 mg (1 tab) for 3 days 04/05/18  Yes Arta Silence, MD  sertraline (ZOLOFT) 50 MG tablet Take 1 tablet (50 mg total) by mouth daily. 01/11/16  Yes Norval Gable, MD  Spacer/Aero-Holding Chambers (AEROCHAMBER PLUS) inhaler Use as instructed 03/09/17  Yes Melynda Ripple, MD  albuterol (PROVENTIL) (2.5 MG/3ML) 0.083% nebulizer solution Take 3 mLs (2.5 mg total) by nebulization every 4 (four) hours as needed for wheezing or shortness of breath. 01/22/18 02/21/18  Danton Clap, PA-C  ipratropium-albuterol (DUONEB) 0.5-2.5 (3) MG/3ML SOLN Take 3 mLs by nebulization every 4 (four) hours as needed for up to 20 days. 04/05/18 04/25/18  Arta Silence, MD  ranitidine (ZANTAC) 300 MG tablet Take 300 mg by mouth. 03/29/17 03/29/18  [provider]   No Known Allergies  FAMILY HISTORY:  family history includes Asthma in her mother; Gout in her father. SOCIAL HISTORY:  reports that she has been smoking cigarettes. She has never used smokeless  tobacco. She reports that she does not drink alcohol or use drugs.   Review of Systems:  Gen:  Denies  fever, sweats, chills weight loss  HEENT: Denies blurred vision, double vision, ear pain, eye pain, hearing loss, nose bleeds, sore throat Cardiac:  No dizziness, chest pain or heaviness, chest tightness,edema, No JVD Resp:   + cough, -sputum production, +shortness of breath,+wheezing, -hemoptysis,  Gi: Denies swallowing difficulty, stomach pain, nausea or vomiting, diarrhea, constipation, bowel incontinence Gu:  Denies bladder incontinence, burning urine Ext:   Denies Joint pain, stiffness or swelling Skin: Denies  skin rash, easy bruising or bleeding or hives Endoc:  Denies polyuria, polydipsia , polyphagia or weight change Psych:   Denies depression, insomnia or hallucinations  Other:  All other systems negative        ASSESSMENT / PLAN:  38 year old African-American female follow-up moderate persistent asthma currently with acute asthma exacerbation in the setting of increased work of breathing shortness of breath wheezing and productive cough Patient has underlying obesity and deconditioned state  Patient has asthma exacerbation at this time Start Z-Pak Start prednisone 40 mg daily for 10 days Refill albuterol as needed  Persistent asthma with asthma exacerbation Patient still smokes Continue  Advair and Spiriva as prescribed Avoidance of triggers and exposures is highly recommended   Smoking cessation strongly advised  Obesity -recommend significant weight loss -recommend changing diet  Deconditioned state -Recommend increased daily activity and exercise    COVID-19 EDUCATION: The signs and symptoms of COVID-19 were discussed with the patient and how to seek care for testing.  The importance of social distancing was discussed today. Hand Washing Techniques and avoid touching face was advised.   Patient satisfied with Plan of action and management. All  questions answered  Follow up in 6 months   Deryn Massengale Patricia Pesa, M.D.  [facility]  Medical Director [place] Director [place] Department       Total Time spent 24 mins  Maretta Bees Patricia Pesa, M.D.  [facility]  Medical Director [place] Director [facility]io[facility] Department

## 2018-10-21 NOTE — Patient Instructions (Signed)
MEDICATION ADJUSTMENTS/LABS AND TESTS ORDERED: Prednisone 40 mg daily for 10 days Z-Pak Albuterol refilled Continue Advair and Spiriva    CURRENT MEDICATIONS REVIEWED AT LENGTH WITH PATIENT TODAY

## 2018-12-08 ENCOUNTER — Other Ambulatory Visit: Payer: Self-pay

## 2018-12-08 ENCOUNTER — Ambulatory Visit
Admission: EM | Admit: 2018-12-08 | Discharge: 2018-12-08 | Disposition: A | Discharge status: Home / Self Care | Payer: 59 | Attending: Family Medicine | Admitting: Family Medicine

## 2018-12-08 DIAGNOSIS — J4541 Moderate persistent asthma with (acute) exacerbation: Secondary | ICD-10-CM

## 2018-12-08 MED ORDER — ALBUTEROL SULFATE HFA 108 (90 BASE) MCG/ACT IN AERS: 1.0000 | INHALATION_SPRAY | Freq: Four times a day (QID) | RESPIRATORY_TRACT | 6 refills | Status: DC | PRN

## 2018-12-08 MED ORDER — METHYLPREDNISOLONE SODIUM SUCC 40 MG IJ SOLR
80.0000 mg | Freq: Once | INTRAMUSCULAR | Status: AC
  Administered 2018-12-08: 80 mg via INTRAMUSCULAR

## 2018-12-08 MED ORDER — MONTELUKAST SODIUM 10 MG PO TABS: 10.0000 mg | ORAL_TABLET | Freq: Every day | ORAL | 1 refills | Status: DC

## 2018-12-08 MED ORDER — PREDNISONE 50 MG PO TABS: ORAL_TABLET | ORAL | 0 refills | Status: DC

## 2018-12-08 NOTE — Discharge Instructions (Signed)
Advair twice daily.  Singulair daily.  Spiriva twice daily  Start the prednisone tomorrow.  Follow up with Dr. Mortimer Fries

## 2018-12-08 NOTE — ED Triage Notes (Signed)
Patient states that she has been having a flare of her asthma over the last week. Patient states that she has been using her inhalers and is out currently.

## 2018-12-08 NOTE — ED Provider Notes (Signed)
[facility]    CSN: [id number] Arrival date & time: 12/08/18  1101  History   Chief Complaint Chief Complaint  Patient presents with  . Asthma   HPI  38 year old female presents with issues with her asthma.  Patient reports a 2-week history of difficulty managing her asthma.  She reports postnasal drip, wheezing, shortness of breath.  She is currently on Singulair, Spiriva, and Advair.  She does not take her Advair and Spiriva as prescribed as she does not like the way it makes her feel.  She has been using DuoNeb treatments frequently.  She states she took 3 treatments before she came here.  She has a pulmonologist.  No fever.  No chills.  No known inciting factor regarding her exacerbation.  However, she believes that it may be due to weather change.  No other associated symptoms.  No other complaints.  PMH, Surgical Hx, Family Hx, Social History reviewed and updated as below.  Past Medical History:  Diagnosis Date  . Anxiety   . Asthma   . Environmental allergies     Patient Active Problem List   Diagnosis Date Noted  . Class 2 obesity due to excess calories with body mass index (BMI) of 39.0 to 39.9 in adult 11/05/2017  . Prediabetes 11/05/2017  . Moderate asthma with acute exacerbation 03/29/2017    Past Surgical History:  Procedure Laterality Date  . CESAREAN SECTION    . TUBAL LIGATION      OB History    Gravida  5   Para  5   Term  5   Preterm      AB      Living        SAB      TAB      Ectopic      Multiple      Live Births               Home Medications    Prior to Admission medications   Medication Sig Start Date End Date Taking? Authorizing Provider  escitalopram (LEXAPRO) 20 MG tablet TAKE 1 TABLET BY MOUTH ONCE DAILY FOR MOOD 09/03/17  Yes [provider]  fexofenadine-pseudoephedrine (ALLEGRA-D ALLERGY & CONGESTION) 180-240 MG 24 hr tablet Take 1 tablet by mouth daily. 02/24/17  Yes Melynda Ripple, MD   fluticasone (FLONASE) 50 MCG/ACT nasal spray Place into the nose.   Yes [provider]  fluticasone-salmeterol (ADVAIR HFA) 230-21 MCG/ACT inhaler Inhale 2 puffs into the lungs 2 (two) times daily.   Yes [provider]  ipratropium-albuterol (DUONEB) 0.5-2.5 (3) MG/3ML SOLN Take 3 mLs by nebulization every 6 (six) hours as needed. 02/24/17  Yes Melynda Ripple, MD  Spacer/Aero-Holding Chambers (AEROCHAMBER PLUS) inhaler Use as instructed 03/09/17  Yes Melynda Ripple, MD  SPIRIVA RESPIMAT 1.25 MCG/ACT AERS TAKE 2 PUFFS BY MOUTH TWICE A DAY 07/16/18  Yes Flora Lipps, MD  ipratropium (ATROVENT) 0.06 % nasal spray Place 2 sprays into both nostrils 4 (four) times daily. 3-4 times/ day 02/24/17 12/08/18 Yes Melynda Ripple, MD  albuterol (VENTOLIN HFA) 108 (90 Base) MCG/ACT inhaler Inhale 1-2 puffs into the lungs every 6 (six) hours as needed for wheezing or shortness of breath. 12/08/18   Coral Spikes, DO  ipratropium-albuterol (DUONEB) 0.5-2.5 (3) MG/3ML SOLN Take 3 mLs by nebulization every 4 (four) hours as needed for up to 20 days. 04/05/18 04/25/18  Arta Silence, MD  montelukast (SINGULAIR) 10 MG tablet Take 1 tablet (10 mg total)  by mouth at bedtime. 12/08/18   Coral Spikes, DO  predniSONE (DELTASONE) 50 MG tablet 1 tablet daily x 5 days 12/08/18   Coral Spikes, DO  ranitidine (ZANTAC) 300 MG tablet Take 300 mg by mouth. 03/29/17 03/29/18  [provider]  sertraline (ZOLOFT) 50 MG tablet Take 1 tablet (50 mg total) by mouth daily. 01/11/16 12/08/18  Norval Gable, MD    Family History Family History  Problem Relation Age of Onset  . Asthma Mother   . Gout Father     Social History Social History   Tobacco Use  . Smoking status: Current Some Day Smoker    Types: Cigarettes    Last attempt to quit: 12/23/2017    Years since quitting: 0.9  . Smokeless tobacco: Never Used  . Tobacco comment: smokes one cigarette occassionally  Substance Use Topics  .  Alcohol use: No  . Drug use: No     Allergies   Patient has no known allergies.   Review of Systems Review of Systems  HENT: Positive for postnasal drip.   Respiratory: Positive for shortness of breath and wheezing.    Physical Exam Triage Vital Signs ED Triage Vitals  Enc Vitals Group     BP 12/08/18 1124 (!) 145/83     Pulse Rate 12/08/18 1124 88     Resp 12/08/18 1124 (!) 23     Temp 12/08/18 1124 98.3 F (36.8 C)     Temp Source 12/08/18 1124 Oral     SpO2 12/08/18 1124 95 %     Weight 12/08/18 1122 240 lb (108.9 kg)     Height 12/08/18 1122 5\' 4"  (1.626 m)     Head Circumference --      Peak Flow --      Pain Score 12/08/18 1122 8     Pain Loc --      Pain Edu? --      Excl. in [place]? --    Updated Vital Signs BP (!) 145/83 (BP Location: Left Arm)   Pulse 88   Temp 98.3 F (36.8 C) (Oral)   Resp (!) 23   Ht 5\' 4"  (1.626 m)   Wt 108.9 kg   LMP 12/01/2018   SpO2 95%   BMI 41.20 kg/m   Visual Acuity Right Eye Distance:   Left Eye Distance:   Bilateral Distance:    Right Eye Near:   Left Eye Near:    Bilateral Near:     Physical Exam Vitals signs and nursing note reviewed.  Constitutional:      General: She is not in acute distress.    Appearance: Normal appearance. She is obese.  HENT:     Head: Normocephalic and atraumatic.  Eyes:     General:        Right eye: No discharge.        Left eye: No discharge.     Conjunctiva/sclera: Conjunctivae normal.  Cardiovascular:     Rate and Rhythm: Normal rate and regular rhythm.     Heart sounds: No murmur.  Pulmonary:     Effort: Pulmonary effort is normal.     Breath sounds: No rhonchi or rales.     Comments: Diffuse expiratory wheezing. Skin:    General: Skin is warm.     Findings: No rash.  Neurological:     Mental Status: She is alert.  Psychiatric:        Mood and Affect: Mood normal.  Behavior: Behavior normal.    UC Treatments / Results  Labs (all labs ordered are listed, but  only abnormal results are displayed) Labs Reviewed - No data to display  EKG   Radiology No results found.  Procedures Procedures (including critical care time)  Medications Ordered in UC Medications  methylPREDNISolone sodium succinate (SOLU-MEDROL) 40 mg/mL injection 80 mg (80 mg Intramuscular Given 12/08/18 1159)    Initial Impression / Assessment and Plan / UC Course  I have reviewed the triage vital signs and the nursing notes.  Pertinent labs & imaging results that were available during my care of the patient were reviewed by me and considered in my medical decision making (see chart for details).    38 year old female presents with an asthma exacerbation.  Advised compliance with her home medications.  Singulair refilled.  Solumedrol given today. Placing on prednisone (to start tomorrow).  Rescue inhaler prescribed as well.  Follow-up with pulmonology.  Final Clinical Impressions(s) / UC Diagnoses   Final diagnoses:  Moderate persistent asthma with exacerbation     Discharge Instructions     Advair twice daily.  Singulair daily.  Spiriva twice daily  Start the prednisone tomorrow.  Follow up with Dr. Mortimer Fries   ED Prescriptions    Medication Sig Dispense Auth. Provider   montelukast (SINGULAIR) 10 MG tablet Take 1 tablet (10 mg total) by mouth at bedtime. 90 tablet Rami Budhu G, DO   albuterol (VENTOLIN HFA) 108 (90 Base) MCG/ACT inhaler Inhale 1-2 puffs into the lungs every 6 (six) hours as needed for wheezing or shortness of breath. 18 g Dyesha Henault G, DO   predniSONE (DELTASONE) 50 MG tablet 1 tablet daily x 5 days 5 tablet Coral Spikes, DO     Controlled Substance Prescriptions [place] Controlled Substance Registry consulted? Not Applicable   Coral Spikes, DO 12/08/18 1400

## 2018-12-29 ENCOUNTER — Other Ambulatory Visit: Payer: Self-pay

## 2018-12-29 ENCOUNTER — Encounter: Payer: Self-pay | Admitting: Emergency Medicine

## 2018-12-29 ENCOUNTER — Emergency Department
Admission: EM | Admit: 2018-12-29 | Discharge: 2018-12-29 | Disposition: A | Discharge status: Home / Self Care | Payer: HRSA Program | Attending: Emergency Medicine | Admitting: Emergency Medicine

## 2018-12-29 DIAGNOSIS — J4541 Moderate persistent asthma with (acute) exacerbation: Secondary | ICD-10-CM | POA: Insufficient documentation

## 2018-12-29 DIAGNOSIS — Z20828 Contact with and (suspected) exposure to other viral communicable diseases: Secondary | ICD-10-CM | POA: Diagnosis not present

## 2018-12-29 DIAGNOSIS — R05 Cough: Secondary | ICD-10-CM | POA: Diagnosis present

## 2018-12-29 DIAGNOSIS — F17210 Nicotine dependence, cigarettes, uncomplicated: Secondary | ICD-10-CM | POA: Insufficient documentation

## 2018-12-29 DIAGNOSIS — Z79899 Other long term (current) drug therapy: Secondary | ICD-10-CM | POA: Insufficient documentation

## 2018-12-29 LAB — SARS CORONAVIRUS 2 (TAT 6-24 HRS): SARS Coronavirus 2: NEGATIVE

## 2018-12-29 MED ORDER — CETIRIZINE-PSEUDOEPHEDRINE ER 5-120 MG PO TB12: 1.0000 | ORAL_TABLET | Freq: Two times a day (BID) | ORAL | 0 refills | Status: AC

## 2018-12-29 MED ORDER — ALBUTEROL SULFATE HFA 108 (90 BASE) MCG/ACT IN AERS: 2.0000 | INHALATION_SPRAY | Freq: Four times a day (QID) | RESPIRATORY_TRACT | 1 refills | Status: DC | PRN

## 2018-12-29 MED ORDER — PREDNISONE 50 MG PO TABS: 50.0000 mg | ORAL_TABLET | Freq: Every day | ORAL | 0 refills | Status: AC

## 2018-12-29 NOTE — ED Triage Notes (Signed)
Pt here with c/o cough, body aches and headache for over a week now, was seen here last week for asthma. States a positive covid case at her job and she wants to be on the safe side and be checked.

## 2018-12-29 NOTE — ED Notes (Signed)
See triage note states she developed URI sx's about 1 month ago   States she was seen at [facility] and placed on prednisone   Stats sx's returned about 2 weeks ago denies any fever   But has had sinus pressure/h/a and cough

## 2018-12-29 NOTE — Discharge Instructions (Addendum)
Take the prescription meds as directed. Follow-up with your provider as needed. Remain at home under quarantine until your results are verified.

## 2018-12-29 NOTE — ED Provider Notes (Signed)
[facility] Emergency Department Provider Note ____________________________________________  Time seen: 1020  I have reviewed the triage vital signs and the nursing notes.  HISTORY  Chief Complaint  Generalized Body Aches, Cough, and Headache  HPI Sabrina Holder is a 38 y.o. female presents to the ED primarily for COVID testing after a coworker was confirmed positive and her supervisor sent her for testing.  She also reports consistent cough and body aches as well as sinus pressure and headache for the last week.  She has a history of moderate persistent asthma and is currently on  Spiriva, Advair, albuterol, Singulair, and a nebulizer.  She also takes Flonase and allergy medicine for symptoms.  She was recently treated by her primary provider for an asthma flare, and reported improvement after course of steroids.  She denies any interim fevers, chills, or sweats.  She presents noted she has had to use her at risk inhaler increasingly over the last week.  She denies any sick contacts, recent travel, or other exposures other than what was noted at the job.  Past Medical History:  Diagnosis Date  . Anxiety   . Asthma   . Environmental allergies   . Renal disorder     Patient Active Problem List   Diagnosis Date Noted  . Class 2 obesity due to excess calories with body mass index (BMI) of 39.0 to 39.9 in adult 11/05/2017  . Prediabetes 11/05/2017  . Moderate asthma with acute exacerbation 03/29/2017    Past Surgical History:  Procedure Laterality Date  . CESAREAN SECTION    . TUBAL LIGATION      Prior to Admission medications   Medication Sig Start Date End Date Taking? Authorizing Provider  albuterol (VENTOLIN HFA) 108 (90 Base) MCG/ACT inhaler Inhale 1-2 puffs into the lungs every 6 (six) hours as needed for wheezing or shortness of breath. 12/08/18   Coral Spikes, DO  albuterol (VENTOLIN HFA) 108 (90 Base) MCG/ACT inhaler Inhale 2 puffs into the lungs  every 6 (six) hours as needed. 12/29/18   Shirell Struthers, Dannielle Karvonen, PA-C  cetirizine-pseudoephedrine (ZYRTEC-D ALLERGY & CONGESTION) 5-120 MG tablet Take 1 tablet by mouth 2 (two) times daily. 12/29/18 01/28/19  Nehemias Sauceda, Dannielle Karvonen, PA-C  escitalopram (LEXAPRO) 20 MG tablet TAKE 1 TABLET BY MOUTH ONCE DAILY FOR MOOD 09/03/17   [provider]  fluticasone (FLONASE) 50 MCG/ACT nasal spray Place into the nose.    [provider]  fluticasone-salmeterol (ADVAIR HFA) 230-21 MCG/ACT inhaler Inhale 2 puffs into the lungs 2 (two) times daily.    [provider]  ipratropium-albuterol (DUONEB) 0.5-2.5 (3) MG/3ML SOLN Take 3 mLs by nebulization every 6 (six) hours as needed. 02/24/17   Melynda Ripple, MD  ipratropium-albuterol (DUONEB) 0.5-2.5 (3) MG/3ML SOLN Take 3 mLs by nebulization every 4 (four) hours as needed for up to 20 days. 04/05/18 04/25/18  Arta Silence, MD  montelukast (SINGULAIR) 10 MG tablet Take 1 tablet (10 mg total) by mouth at bedtime. 12/08/18   Coral Spikes, DO  predniSONE (DELTASONE) 50 MG tablet Take 1 tablet (50 mg total) by mouth daily with breakfast for 5 days. 12/29/18 01/03/19  Toryn Dewalt, Dannielle Karvonen, PA-C  ranitidine (ZANTAC) 300 MG tablet Take 300 mg by mouth. 03/29/17 03/29/18  [provider]  Spacer/Aero-Holding Chambers (AEROCHAMBER PLUS) inhaler Use as instructed 03/09/17   Melynda Ripple, MD  SPIRIVA RESPIMAT 1.25 MCG/ACT AERS TAKE 2 PUFFS BY MOUTH TWICE A DAY 07/16/18   Kasa,  Maretta Bees, MD  ipratropium (ATROVENT) 0.06 % nasal spray Place 2 sprays into both nostrils 4 (four) times daily. 3-4 times/ day 02/24/17 12/08/18  Melynda Ripple, MD  sertraline (ZOLOFT) 50 MG tablet Take 1 tablet (50 mg total) by mouth daily. 01/11/16 12/08/18  Norval Gable, MD    Allergies Patient has no known allergies.  Family History  Problem Relation Age of Onset  . Asthma Mother   . Gout Father     Social History Social History   Tobacco Use   . Smoking status: Current Some Day Smoker    Types: Cigarettes    Last attempt to quit: 12/23/2017    Years since quitting: 1.0  . Smokeless tobacco: Never Used  . Tobacco comment: smokes one cigarette occassionally  Substance Use Topics  . Alcohol use: No  . Drug use: No    Review of Systems  Constitutional: Negative for fever. Eyes: Negative for visual changes. ENT: Negative for sore throat.  Reports sinus congestion. Cardiovascular: Negative for chest pain. Respiratory: Positive for shortness of breath. Gastrointestinal: Negative for abdominal pain, vomiting and diarrhea. Genitourinary: Negative for dysuria. Musculoskeletal: Negative for back pain. Skin: Negative for rash. Neurological: Negative for headaches, focal weakness or numbness. ____________________________________________  PHYSICAL EXAM:  VITAL SIGNS: ED Triage Vitals  Enc Vitals Group     BP 12/29/18 1005 (!) 112/52     Pulse Rate 12/29/18 1005 97     Resp 12/29/18 1005 16     Temp 12/29/18 1005 98.8 F (37.1 C)     Temp Source 12/29/18 1005 Oral     SpO2 12/29/18 1005 97 %     Weight 12/29/18 1007 248 lb (112.5 kg)     Height 12/29/18 1007 5\' 4"  (1.626 m)     Head Circumference --      Peak Flow --      Pain Score 12/29/18 1006 4     Pain Loc --      Pain Edu? --      Excl. in [place]? --     Constitutional: Alert and oriented. Well appearing and in no distress. Head: Normocephalic and atraumatic. Eyes: Conjunctivae are normal. PERRL. Normal extraocular movements Ears: Canals clear. TMs intact bilaterally. Cardiovascular: Normal rate, regular rhythm. Normal distal pulses. Respiratory: Normal respiratory effort. No rales/rhonchi.  Bilateral wheezes noted intermittently from the apices to the bases. Gastrointestinal: Soft and nontender. No distention. Musculoskeletal: Nontender with normal range of motion in all extremities.  Neurologic:  Normal gait without ataxia. Normal speech and language. No gross  focal neurologic deficits are appreciated. Skin:  Skin is warm, dry and intact. No rash noted. ____________________________________________   LABS (pertinent positives/negatives) Labs Reviewed  SARS CORONAVIRUS 2 (TAT 6-24 HRS)  ____________________________________________  PROCEDURES  Procedures ____________________________________________  INITIAL IMPRESSION / ASSESSMENT AND PLAN / ED COURSE  Patient with ED evaluation of a probable asthma flare and concern for coronavirus.  Patient is discharged with prescription for prednisone, allergy medicine-decongestant, and a refill on her albuterol inhaler.  She is also pending results of COVID test.  She will remain at home under quarantine until results are verified.  Return precautions have been reviewed.  Female Sabrina Holder was evaluated in Emergency Department on 12/29/2018 for the symptoms described in the history of present illness. She was evaluated in the context of the global COVID-19 pandemic, which necessitated consideration that the patient might be at risk for infection with the SARS-CoV-2 virus that causes COVID-19. Institutional protocols and algorithms that pertain  to the evaluation of patients at risk for COVID-19 are in a state of rapid change based on information released by regulatory bodies including the CDC and federal and state organizations. These policies and algorithms were followed during the patient's care in the ED. ___________________________________________  FINAL CLINICAL IMPRESSION(S) / ED DIAGNOSES  Final diagnoses:  Moderate persistent asthma with exacerbation      Melvenia Needles, PA-C 12/29/18 1145    Vanessa [place], MD 12/31/18 Pauline Aus

## 2019-01-02 ENCOUNTER — Telehealth: Payer: Self-pay | Admitting: General Practice

## 2019-01-02 NOTE — Telephone Encounter (Signed)
Gave patient negative covid test results Patient understood 

## 2019-02-06 ENCOUNTER — Ambulatory Visit
Admission: EM | Admit: 2019-02-06 | Discharge: 2019-02-06 | Disposition: A | Discharge status: Home / Self Care | Payer: Self-pay | Attending: Family Medicine | Admitting: Family Medicine

## 2019-02-06 ENCOUNTER — Other Ambulatory Visit: Payer: Self-pay

## 2019-02-06 DIAGNOSIS — F17210 Nicotine dependence, cigarettes, uncomplicated: Secondary | ICD-10-CM

## 2019-02-06 DIAGNOSIS — J4541 Moderate persistent asthma with (acute) exacerbation: Secondary | ICD-10-CM

## 2019-02-06 MED ORDER — PREDNISONE 20 MG PO TABS: ORAL_TABLET | ORAL | 0 refills | Status: DC

## 2019-02-06 MED ORDER — METHYLPREDNISOLONE SODIUM SUCC 125 MG IJ SOLR
125.0000 mg | Freq: Once | INTRAMUSCULAR | Status: AC
  Administered 2019-02-06: 18:00:00 125 mg via INTRAMUSCULAR

## 2019-02-06 NOTE — ED Triage Notes (Signed)
Patient states that she has been having a flare of her asthma. States that she took 6 breathing treatments today. Patient states that she just feels like she cannot adequately get air in/out. Patient states that she has been using her inhalers.

## 2019-02-06 NOTE — Discharge Instructions (Addendum)
Continue current asthma medications including nebulizer treatments at home If not improving or if symptoms worsening then go to the Emergency Department

## 2019-02-20 NOTE — ED Provider Notes (Addendum)
[facility]    CSN: [id number] Arrival date & time: 02/06/19  1746      History   Chief Complaint Chief Complaint  Patient presents with  . Shortness of Breath  . Asthma    HPI Sabrina Holder is a 38 y.o. female.   38 yo female with a c/o "asthma flare" for the past 2-3 days. Has been doing albuterol nebulizer treatments at home with temporary relief. Denies any fevers, chills, chest pain.    Shortness of Breath Asthma Associated symptoms include shortness of breath.    Past Medical History:  Diagnosis Date  . Anxiety   . Asthma   . Environmental allergies   . Renal disorder     Patient Active Problem List   Diagnosis Date Noted  . Class 2 obesity due to excess calories with body mass index (BMI) of 39.0 to 39.9 in adult 11/05/2017  . Prediabetes 11/05/2017  . Moderate asthma with acute exacerbation 03/29/2017    Past Surgical History:  Procedure Laterality Date  . CESAREAN SECTION    . TUBAL LIGATION      OB History    Gravida  5   Para  5   Term  5   Preterm      AB      Living        SAB      TAB      Ectopic      Multiple      Live Births               Home Medications    Prior to Admission medications   Medication Sig Start Date End Date Taking? Authorizing Provider  albuterol (VENTOLIN HFA) 108 (90 Base) MCG/ACT inhaler Inhale 1-2 puffs into the lungs every 6 (six) hours as needed for wheezing or shortness of breath. 12/08/18  Yes Cook, Jayce G, DO  fexofenadine-pseudoephedrine (ALLEGRA-D 24) 180-240 MG 24 hr tablet Take 1 tablet by mouth daily.   Yes [provider]  fluticasone (FLONASE) 50 MCG/ACT nasal spray Place into the nose.   Yes [provider]  fluticasone-salmeterol (ADVAIR HFA) 230-21 MCG/ACT inhaler Inhale 2 puffs into the lungs 2 (two) times daily.   Yes [provider]  ipratropium-albuterol (DUONEB) 0.5-2.5 (3) MG/3ML SOLN Take 3 mLs by nebulization every 6 (six)  hours as needed. 02/24/17  Yes Melynda Ripple, MD  montelukast (SINGULAIR) 10 MG tablet Take 1 tablet (10 mg total) by mouth at bedtime. 12/08/18  Yes Cook, Barnie Del, DO  Spacer/Aero-Holding Chambers (AEROCHAMBER PLUS) inhaler Use as instructed 03/09/17  Yes Melynda Ripple, MD  SPIRIVA RESPIMAT 1.25 MCG/ACT AERS TAKE 2 PUFFS BY MOUTH TWICE A DAY 07/16/18  Yes Kasa, Maretta Bees, MD  albuterol (VENTOLIN HFA) 108 (90 Base) MCG/ACT inhaler Inhale 2 puffs into the lungs every 6 (six) hours as needed. 12/29/18   Menshew, Dannielle Karvonen, PA-C  escitalopram (LEXAPRO) 20 MG tablet TAKE 1 TABLET BY MOUTH ONCE DAILY FOR MOOD 09/03/17   [provider]  ipratropium-albuterol (DUONEB) 0.5-2.5 (3) MG/3ML SOLN Take 3 mLs by nebulization every 4 (four) hours as needed for up to 20 days. 04/05/18 04/25/18  Arta Silence, MD  predniSONE (DELTASONE) 20 MG tablet 3 tabs po qd x 2 days, then 2 tabs po qd x 3 days, then 1 tab po qd x 3 days, then half a tab po qd x 2 days 02/06/19   Norval Gable, MD  ranitidine (ZANTAC) 300 MG tablet  Take 300 mg by mouth. 03/29/17 03/29/18  [provider]  ipratropium (ATROVENT) 0.06 % nasal spray Place 2 sprays into both nostrils 4 (four) times daily. 3-4 times/ day 02/24/17 12/08/18  Melynda Ripple, MD  sertraline (ZOLOFT) 50 MG tablet Take 1 tablet (50 mg total) by mouth daily. 01/11/16 12/08/18  Norval Gable, MD    Family History Family History  Problem Relation Age of Onset  . Asthma Mother   . Gout Father     Social History Social History   Tobacco Use  . Smoking status: Current Some Day Smoker    Types: Cigarettes    Last attempt to quit: 12/23/2017    Years since quitting: 1.1  . Smokeless tobacco: Never Used  . Tobacco comment: smokes one cigarette occassionally  Substance Use Topics  . Alcohol use: No  . Drug use: No     Allergies   Patient has no known allergies.   Review of Systems Review of Systems  Respiratory: Positive for shortness  of breath.      Physical Exam Triage Vital Signs ED Triage Vitals  Enc Vitals Group     BP 02/06/19 1801 (!) 146/74     Pulse Rate 02/06/19 1801 (!) 106     Resp 02/06/19 1801 (!) 26     Temp 02/06/19 1801 98.5 F (36.9 C)     Temp Source 02/06/19 1801 Oral     SpO2 02/06/19 1801 95 %     Weight 02/06/19 1758 245 lb (111.1 kg)     Height 02/06/19 1758 5\' 4"  (1.626 m)     Head Circumference --      Peak Flow --      Pain Score 02/06/19 1758 8     Pain Loc --      Pain Edu? --      Excl. in [place]? --    No data found.  Updated Vital Signs BP (!) 146/74 (BP Location: Left Arm)   Pulse (!) 106   Temp 98.5 F (36.9 C) (Oral)   Resp (!) 26   Ht 5\' 4"  (1.626 m)   Wt 111.1 kg   LMP 02/04/2019   SpO2 95%   BMI 42.05 kg/m   Visual Acuity Right Eye Distance:   Left Eye Distance:   Bilateral Distance:    Right Eye Near:   Left Eye Near:    Bilateral Near:     Physical Exam Vitals signs and nursing note reviewed.  Constitutional:      General: She is not in acute distress.    Appearance: She is not toxic-appearing or diaphoretic.  HENT:     Mouth/Throat:     Pharynx: No oropharyngeal exudate or posterior oropharyngeal erythema.  Cardiovascular:     Rate and Rhythm: Regular rhythm. Tachycardia present.     Heart sounds: Normal heart sounds.  Pulmonary:     Effort: Pulmonary effort is normal. No respiratory distress.     Breath sounds: No stridor. Wheezing (diffuse, bilaterally) present. No rhonchi or rales.  Neurological:     Mental Status: She is alert.      UC Treatments / Results  Labs (all labs ordered are listed, but only abnormal results are displayed) Labs Reviewed - No data to display  EKG   Radiology No results found.  Procedures Procedures (including critical care time)  Medications Ordered in UC Medications  methylPREDNISolone sodium succinate (SOLU-MEDROL) 125 mg/2 mL injection 125 mg (125 mg Intramuscular Given 02/06/19 1827)  Initial Impression / Assessment and Plan / UC Course  I have reviewed the triage vital signs and the nursing notes.  Pertinent labs & imaging results that were available during my care of the patient were reviewed by me and considered in my medical decision making (see chart for details).      Final Clinical Impressions(s) / UC Diagnoses   Final diagnoses:  Moderate persistent asthma with exacerbation     Discharge Instructions     Continue current asthma medications including nebulizer treatments at home If not improving or if symptoms worsening then go to the Emergency Department    ED Prescriptions    Medication Sig Dispense Auth. Provider   predniSONE (DELTASONE) 20 MG tablet 3 tabs po qd x 2 days, then 2 tabs po qd x 3 days, then 1 tab po qd x 3 days, then half a tab po qd x 2 days 16 tablet Deylan Canterbury, MD      1. diagnosis reviewed with patient; given solumedrol 125mg  IM x 1 2. rx as per orders above; reviewed possible side effects, interactions, risks and benefits  3. Recommend supportive treatment as above 4. Follow-up prn if symptoms worsen or don't improve  PDMP not reviewed this encounter.   Norval Gable, MD 02/20/19 [place], MD 02/20/19 1141

## 2019-03-26 ENCOUNTER — Other Ambulatory Visit: Payer: Self-pay

## 2019-03-26 ENCOUNTER — Emergency Department
Admission: EM | Admit: 2019-03-26 | Discharge: 2019-03-27 | Disposition: A | Discharge status: Home / Self Care | Payer: Self-pay | Attending: Emergency Medicine | Admitting: Emergency Medicine

## 2019-03-26 ENCOUNTER — Encounter: Payer: Self-pay | Admitting: Emergency Medicine

## 2019-03-26 ENCOUNTER — Emergency Department: Payer: Self-pay

## 2019-03-26 DIAGNOSIS — Z79899 Other long term (current) drug therapy: Secondary | ICD-10-CM | POA: Insufficient documentation

## 2019-03-26 DIAGNOSIS — Z20828 Contact with and (suspected) exposure to other viral communicable diseases: Secondary | ICD-10-CM | POA: Insufficient documentation

## 2019-03-26 DIAGNOSIS — F17210 Nicotine dependence, cigarettes, uncomplicated: Secondary | ICD-10-CM | POA: Insufficient documentation

## 2019-03-26 DIAGNOSIS — F1010 Alcohol abuse, uncomplicated: Secondary | ICD-10-CM | POA: Insufficient documentation

## 2019-03-26 DIAGNOSIS — F10920 Alcohol use, unspecified with intoxication, uncomplicated: Secondary | ICD-10-CM

## 2019-03-26 DIAGNOSIS — J45901 Unspecified asthma with (acute) exacerbation: Secondary | ICD-10-CM | POA: Insufficient documentation

## 2019-03-26 DIAGNOSIS — Y907 Blood alcohol level of 200-239 mg/100 ml: Secondary | ICD-10-CM | POA: Insufficient documentation

## 2019-03-26 LAB — CBC WITH DIFFERENTIAL/PLATELET
Abs Immature Granulocytes: 0.04 10*3/uL (ref 0.00–0.07)
Basophils Absolute: 0.1 10*3/uL (ref 0.0–0.1)
Basophils Relative: 1 %
Eosinophils Absolute: 0.9 10*3/uL — ABNORMAL HIGH (ref 0.0–0.5)
Eosinophils Relative: 9 %
HCT: 33.9 % — ABNORMAL LOW (ref 36.0–46.0)
Hemoglobin: 10.4 g/dL — ABNORMAL LOW (ref 12.0–15.0)
Immature Granulocytes: 0 %
Lymphocytes Relative: 29 %
Lymphs Abs: 3.2 10*3/uL (ref 0.7–4.0)
MCH: 24.6 pg — ABNORMAL LOW (ref 26.0–34.0)
MCHC: 30.7 g/dL (ref 30.0–36.0)
MCV: 80.1 fL (ref 80.0–100.0)
Monocytes Absolute: 0.9 10*3/uL (ref 0.1–1.0)
Monocytes Relative: 9 %
Neutro Abs: 5.8 10*3/uL (ref 1.7–7.7)
Neutrophils Relative %: 52 %
Platelets: 298 10*3/uL (ref 150–400)
RBC: 4.23 MIL/uL (ref 3.87–5.11)
RDW: 17.3 % — ABNORMAL HIGH (ref 11.5–15.5)
WBC: 11 10*3/uL — ABNORMAL HIGH (ref 4.0–10.5)
nRBC: 0 % (ref 0.0–0.2)

## 2019-03-26 MED ORDER — IPRATROPIUM-ALBUTEROL 0.5-2.5 (3) MG/3ML IN SOLN: 3.0000 mL | Freq: Once | RESPIRATORY_TRACT | Status: AC

## 2019-03-26 MED ORDER — IPRATROPIUM-ALBUTEROL 0.5-2.5 (3) MG/3ML IN SOLN
RESPIRATORY_TRACT | Status: AC
  Administered 2019-03-26: 3 mL via RESPIRATORY_TRACT
  Filled 2019-03-26: qty 6

## 2019-03-26 MED ORDER — MAGNESIUM SULFATE 2 GM/50ML IV SOLN
2.0000 g | Freq: Once | INTRAVENOUS | Status: AC
  Administered 2019-03-26: 2 g via INTRAVENOUS
  Filled 2019-03-26: qty 50

## 2019-03-26 MED ORDER — METHYLPREDNISOLONE SODIUM SUCC 125 MG IJ SOLR
INTRAMUSCULAR | Status: AC
  Administered 2019-03-26: 125 mg via INTRAVENOUS
  Filled 2019-03-26: qty 2

## 2019-03-26 MED ORDER — METHYLPREDNISOLONE SODIUM SUCC 125 MG IJ SOLR: 125.0000 mg | Freq: Once | INTRAMUSCULAR | Status: AC

## 2019-03-26 MED ORDER — IPRATROPIUM-ALBUTEROL 0.5-2.5 (3) MG/3ML IN SOLN
3.0000 mL | Freq: Once | RESPIRATORY_TRACT | Status: AC
  Administered 2019-03-26: 3 mL via RESPIRATORY_TRACT

## 2019-03-26 MED ORDER — SODIUM CHLORIDE 0.9 % IV BOLUS
1000.0000 mL | Freq: Once | INTRAVENOUS | Status: AC
  Administered 2019-03-26: 1000 mL via INTRAVENOUS

## 2019-03-26 NOTE — ED Triage Notes (Signed)
Patient to ER for c/o respiratory distress/shortness of breath. Patient has known history of asthma. Patient sounds very tight with + retractions. Patient unable to speak in complete sentences. Patient has been using inhaler at home with no relief.

## 2019-03-27 LAB — BASIC METABOLIC PANEL
Anion gap: 10 (ref 5–15)
BUN: 8 mg/dL (ref 6–20)
CO2: 21 mmol/L — ABNORMAL LOW (ref 22–32)
Calcium: 8.8 mg/dL — ABNORMAL LOW (ref 8.9–10.3)
Chloride: 105 mmol/L (ref 98–111)
Creatinine, Ser: 0.73 mg/dL (ref 0.44–1.00)
GFR calc Af Amer: >60 mL/min (ref >60)
GFR calc non Af Amer: >60 mL/min (ref >60)
Glucose, Bld: 107 mg/dL — ABNORMAL HIGH (ref 70–99)
Potassium: 3.8 mmol/L (ref 3.5–5.1)
Sodium: 136 mmol/L (ref 135–145)

## 2019-03-27 LAB — ETHANOL: Alcohol, Ethyl (B): 224 mg/dL — ABNORMAL HIGH (ref <10)

## 2019-03-27 LAB — POC SARS CORONAVIRUS 2 AG: SARS Coronavirus 2 Ag: NEGATIVE

## 2019-03-27 MED ORDER — ALBUTEROL SULFATE HFA 108 (90 BASE) MCG/ACT IN AERS: 2.0000 | INHALATION_SPRAY | Freq: Four times a day (QID) | RESPIRATORY_TRACT | 0 refills | Status: DC | PRN

## 2019-03-27 MED ORDER — PREDNISONE 20 MG PO TABS: 60.0000 mg | ORAL_TABLET | Freq: Every day | ORAL | 0 refills | Status: AC

## 2019-03-27 MED ORDER — ALBUTEROL SULFATE (2.5 MG/3ML) 0.083% IN NEBU
2.5000 mg | INHALATION_SOLUTION | Freq: Once | RESPIRATORY_TRACT | Status: AC
  Administered 2019-03-27: 2.5 mg via RESPIRATORY_TRACT
  Filled 2019-03-27: qty 3

## 2019-03-27 NOTE — ED Notes (Signed)
Patient wants to leave, continues to get out of bed. Patient educated on safety and IV maintenance. MD in to speak with patient

## 2019-03-27 NOTE — ED Provider Notes (Signed)
[facility] Emergency Department Provider Note  ____________________________________________   First MD Initiated Contact with Patient 03/26/19 2330     (approximate)  I have reviewed the triage vital signs and the nursing notes.   HISTORY  Chief Complaint Respiratory Distress    HPI Sabrina Holder is a 38 y.o. female with below list of previous medical conditions including asthma presents to the emergency department secondary to respiratory distress and dyspnea which patient states started tonight.  Patient states that she is used her albuterol inhaler repetitively over the course of the evening.  Patient also admits to "drinking "white liquor" because her cousin told her that would improve her symptoms however did not do so.  Patient denies any chest pain.  Patient denies any fever        Past Medical History:  Diagnosis Date  . Anxiety   . Asthma   . Environmental allergies   . Renal disorder     Patient Active Problem List   Diagnosis Date Noted  . Class 2 obesity due to excess calories with body mass index (BMI) of 39.0 to 39.9 in adult 11/05/2017  . Prediabetes 11/05/2017  . Moderate asthma with acute exacerbation 03/29/2017    Past Surgical History:  Procedure Laterality Date  . CESAREAN SECTION    . TUBAL LIGATION      Prior to Admission medications   Medication Sig Start Date End Date Taking? Authorizing Provider  albuterol (VENTOLIN HFA) 108 (90 Base) MCG/ACT inhaler Inhale 1-2 puffs into the lungs every 6 (six) hours as needed for wheezing or shortness of breath. 12/08/18   Coral Spikes, DO  albuterol (VENTOLIN HFA) 108 (90 Base) MCG/ACT inhaler Inhale 2 puffs into the lungs every 6 (six) hours as needed. 12/29/18   Menshew, Dannielle Karvonen, PA-C  escitalopram (LEXAPRO) 20 MG tablet TAKE 1 TABLET BY MOUTH ONCE DAILY FOR MOOD 09/03/17   [provider]  fexofenadine-pseudoephedrine (ALLEGRA-D 24) 180-240 MG 24 hr tablet  Take 1 tablet by mouth daily.    [provider]  fluticasone (FLONASE) 50 MCG/ACT nasal spray Place into the nose.    [provider]  fluticasone-salmeterol (ADVAIR HFA) 230-21 MCG/ACT inhaler Inhale 2 puffs into the lungs 2 (two) times daily.    [provider]  ipratropium-albuterol (DUONEB) 0.5-2.5 (3) MG/3ML SOLN Take 3 mLs by nebulization every 6 (six) hours as needed. 02/24/17   Melynda Ripple, MD  ipratropium-albuterol (DUONEB) 0.5-2.5 (3) MG/3ML SOLN Take 3 mLs by nebulization every 4 (four) hours as needed for up to 20 days. 04/05/18 04/25/18  Arta Silence, MD  montelukast (SINGULAIR) 10 MG tablet Take 1 tablet (10 mg total) by mouth at bedtime. 12/08/18   Coral Spikes, DO  predniSONE (DELTASONE) 20 MG tablet 3 tabs po qd x 2 days, then 2 tabs po qd x 3 days, then 1 tab po qd x 3 days, then half a tab po qd x 2 days 02/06/19   Norval Gable, MD  ranitidine (ZANTAC) 300 MG tablet Take 300 mg by mouth. 03/29/17 03/29/18  [provider]  Spacer/Aero-Holding Chambers (AEROCHAMBER PLUS) inhaler Use as instructed 03/09/17   Melynda Ripple, MD  SPIRIVA RESPIMAT 1.25 MCG/ACT AERS TAKE 2 PUFFS BY MOUTH TWICE A DAY 07/16/18   Flora Lipps, MD  ipratropium (ATROVENT) 0.06 % nasal spray Place 2 sprays into both nostrils 4 (four) times daily. 3-4 times/ day 02/24/17 12/08/18  Melynda Ripple, MD  sertraline (ZOLOFT) 50 MG tablet  Take 1 tablet (50 mg total) by mouth daily. 01/11/16 12/08/18  Norval Gable, MD    Allergies Patient has no known allergies.  Family History  Problem Relation Age of Onset  . Asthma Mother   . Gout Father     Social History Social History   Tobacco Use  . Smoking status: Current Some Day Smoker    Types: Cigarettes    Last attempt to quit: 12/23/2017    Years since quitting: 1.2  . Smokeless tobacco: Never Used  . Tobacco comment: smokes one cigarette occassionally  Substance Use Topics  . Alcohol use: No  . Drug  use: No    Review of Systems Constitutional: No fever/chills Eyes: No visual changes. ENT: No sore throat. Cardiovascular: Denies chest pain. Respiratory: Positive for shortness of breath and wheezing Gastrointestinal: No abdominal pain.  No nausea, no vomiting.  No diarrhea.  No constipation. Genitourinary: Negative for dysuria. Musculoskeletal: Negative for neck pain.  Negative for back pain. Integumentary: Negative for rash. Neurological: Negative for headaches, focal weakness or numbness.   ____________________________________________   PHYSICAL EXAM:  VITAL SIGNS: ED Triage Vitals  Enc Vitals Group     BP 03/26/19 2323 (!) 150/135     Pulse Rate 03/26/19 2323 (!) 145     Resp 03/26/19 2323 (!) 36     Temp --      Temp src --      SpO2 03/26/19 2320 (!) 89 %     Weight 03/26/19 2325 111.1 kg (244 lb 14.9 oz)     Height --      Head Circumference --      Peak Flow --      Pain Score 03/26/19 2325 0     Pain Loc --      Pain Edu? --      Excl. in [place]? --     Constitutional: Alert and oriented.  Apparent respiratory distress.  Appears intoxicated Eyes: Conjunctivae are normal.  Mouth/Throat: Patient is wearing a mask. Neck: No stridor.  No meningeal signs.   Cardiovascular: Normal rate, regular rhythm. Good peripheral circulation. Grossly normal heart sounds. Respiratory: Tachypnea, diffuse expiratory wheezing, positive accessory respiratory muscle use. Gastrointestinal: Soft and nontender. No distention.  Musculoskeletal: No lower extremity tenderness nor edema. No gross deformities of extremities. Neurologic:  Normal speech and language. No gross focal neurologic deficits are appreciated.  Skin:  Skin is warm, dry and intact. Psychiatric: Mood and affect are normal. Speech and behavior are normal.  ____________________________________________   LABS (all labs ordered are listed, but only abnormal results are displayed)  Labs Reviewed  CBC WITH  DIFFERENTIAL/PLATELET - Abnormal; Notable for the following components:      Result Value   WBC 11.0 (*)    Hemoglobin 10.4 (*)    HCT 33.9 (*)    MCH 24.6 (*)    RDW 17.3 (*)    Eosinophils Absolute 0.9 (*)    All other components within normal limits  BASIC METABOLIC PANEL - Abnormal; Notable for the following components:   CO2 21 (*)    Glucose, Bld 107 (*)    Calcium 8.8 (*)    All other components within normal limits  ETHANOL - Abnormal; Notable for the following components:   Alcohol, Ethyl (B) 224 (*)    All other components within normal limits  POC SARS CORONAVIRUS 2 AG -  ED  POC SARS CORONAVIRUS 2 AG   ____________________________________________  EKG  ED ECG REPORT I,  [place], the attending physician, personally viewed and interpreted this ECG.   Date: 03/27/2019  EKG Time: 11:33 PM  Rate: 137  Rhythm: Sinus tachycardia  Axis: Normal  Intervals: Normal  ST&T Change: None  ____________________________________________  RADIOLOGY I, Madelia N Gene Colee, personally viewed and evaluated these images (plain radiographs) as part of my medical decision making, as well as reviewing the written report by the radiologist.  ED MD interpretation: No active cardiopulmonary disease noted on chest x-ray per radiologist  Official radiology report(s): DG Chest Port 1 View  Result Date: 03/26/2019 CLINICAL DATA:  Dyspnea EXAM: PORTABLE CHEST 1 VIEW COMPARISON:  October 17, 2018 FINDINGS: The heart size and mediastinal contours are within normal limits. Both lungs are clear. The visualized skeletal structures are unremarkable. IMPRESSION: No active disease. Electronically Signed   By: Prudencio Pair M.D.   On: 03/26/2019 23:56    ____________________________________________   PROCEDURES    .Critical Care Performed by: Gregor Hams, MD Authorized by: Gregor Hams, MD   Critical care provider statement:    Critical care time (minutes):  45   Critical  care time was exclusive of:  Separately billable procedures and treating other patients   Critical care was necessary to treat or prevent imminent or life-threatening deterioration of the following conditions:  Respiratory failure   Critical care was time spent personally by me on the following activities:  Development of treatment plan with patient or surrogate, discussions with consultants, evaluation of patient's response to treatment, examination of patient, obtaining history from patient or surrogate, ordering and performing treatments and interventions, ordering and review of laboratory studies, ordering and review of radiographic studies, pulse oximetry, re-evaluation of patient's condition and review of old charts     ____________________________________________   INITIAL IMPRESSION / MDM / [place] / ED COURSE  As part of my medical decision making, I reviewed the following data within the electronic MEDICAL RECORD NUMBER  38 year old female presented with above-stated history and physical exam a differential diagnosis including but not limited to asthma exacerbation pneumonia bronchitis COVID-19 and alcohol intoxication.  Chest x-ray revealed no evidence of pneumonia.  Laboratory data notable for an alcohol level of 224.  Patient given 2 duo nebs, IV Solu-Medrol 125 mg and 2 g of IV magnesium.  COVID-19 testing negative.  On reevaluation patient's respiratory status much improved oxygen saturation currently 96% on room air patient is no longer tachypneic.  Patient states "I feel much better".  Given alcohol intoxication patient will be discharged home in the custody of family.  Patient be prescribed prednisone and albuterol for home.  ____________________________________________  FINAL CLINICAL IMPRESSION(S) / ED DIAGNOSES  Final diagnoses:  Alcoholic intoxication without complication (HCC)  Moderate asthma with exacerbation, unspecified whether persistent     MEDICATIONS  GIVEN DURING THIS VISIT:  Medications  methylPREDNISolone sodium succinate (SOLU-MEDROL) 125 mg/2 mL injection 125 mg (125 mg Intravenous Given 03/26/19 2333)  ipratropium-albuterol (DUONEB) 0.5-2.5 (3) MG/3ML nebulizer solution 3 mL (3 mLs Nebulization Given 03/26/19 2334)  ipratropium-albuterol (DUONEB) 0.5-2.5 (3) MG/3ML nebulizer solution 3 mL (3 mLs Nebulization Given 03/26/19 2334)  magnesium sulfate IVPB 2 g 50 mL (0 g Intravenous Stopped 03/27/19 0037)  sodium chloride 0.9 % bolus 1,000 mL (1,000 mLs Intravenous New Bag/Given 03/26/19 2353)     ED Discharge Orders    None      *Please note:  Taurie Greenslade was evaluated in Emergency Department on 03/27/2019 for the symptoms described in the  history of present illness. She was evaluated in the context of the global COVID-19 pandemic, which necessitated consideration that the patient might be at risk for infection with the SARS-CoV-2 virus that causes COVID-19. Institutional protocols and algorithms that pertain to the evaluation of patients at risk for COVID-19 are in a state of rapid change based on information released by regulatory bodies including the CDC and federal and state organizations. These policies and algorithms were followed during the patient's care in the ED.  Some ED evaluations and interventions may be delayed as a result of limited staffing during the pandemic.*  Note:  This document was prepared using Dragon voice recognition software and may include unintentional dictation errors.   Gregor Hams, MD 03/27/19 [phone]

## 2019-03-27 NOTE — ED Notes (Signed)
Patient asleep at this time.

## 2019-03-27 NOTE — ED Notes (Signed)
Patient very eager to go home

## 2019-03-27 NOTE — ED Notes (Signed)
Patient agreeable to staying for a little while longer for medications

## 2019-03-27 NOTE — ED Notes (Signed)
Patient called her ride, this RN will escort her to [place] when here as patient is intoxicated

## 2019-04-27 ENCOUNTER — Other Ambulatory Visit: Payer: Self-pay

## 2019-04-27 ENCOUNTER — Ambulatory Visit
Admission: EM | Admit: 2019-04-27 | Discharge: 2019-04-27 | Disposition: A | Discharge status: Home / Self Care | Payer: Self-pay | Attending: Family Medicine | Admitting: Family Medicine

## 2019-04-27 DIAGNOSIS — J4541 Moderate persistent asthma with (acute) exacerbation: Secondary | ICD-10-CM

## 2019-04-27 DIAGNOSIS — F17210 Nicotine dependence, cigarettes, uncomplicated: Secondary | ICD-10-CM

## 2019-04-27 MED ORDER — MONTELUKAST SODIUM 10 MG PO TABS: 10.0000 mg | ORAL_TABLET | Freq: Every day | ORAL | 0 refills | Status: DC

## 2019-04-27 MED ORDER — METHYLPREDNISOLONE SODIUM SUCC 125 MG IJ SOLR
125.0000 mg | Freq: Once | INTRAMUSCULAR | Status: AC
  Administered 2019-04-27: 17:00:00 125 mg via INTRAMUSCULAR

## 2019-04-27 MED ORDER — ALBUTEROL SULFATE HFA 108 (90 BASE) MCG/ACT IN AERS: INHALATION_SPRAY | RESPIRATORY_TRACT | 0 refills | Status: DC

## 2019-04-27 MED ORDER — IPRATROPIUM-ALBUTEROL 0.5-2.5 (3) MG/3ML IN SOLN: 3.0000 mL | Freq: Four times a day (QID) | RESPIRATORY_TRACT | 0 refills | Status: DC | PRN

## 2019-04-27 MED ORDER — PREDNISONE 20 MG PO TABS: ORAL_TABLET | ORAL | 0 refills | Status: DC

## 2019-04-27 NOTE — Discharge Instructions (Signed)
Follow up with Primary Care Provider Go to Emergency Department if symptoms worsen

## 2019-04-27 NOTE — ED Triage Notes (Signed)
Pt presents with c/o asthma flare. She states she has been without Singulair, Allegra-D and Duoneb. She just used the last dose of her albuterol HFA today. She reports onset of shob this morning after leaving the house this morning. She states she has been using the DuoNeb and Spiriva BID, she states her breathing is worse at night when she lays down to go to bed. She does not currently have any insurance so has not been able to get refills on her medications. Pt is very tearful and states she has not been able to see her regular provider recently.

## 2019-04-29 NOTE — ED Provider Notes (Signed)
[facility]    CSN: [id number] Arrival date & time: 04/27/19  1600      History   Chief Complaint Chief Complaint  Patient presents with  . Asthma    HPI Sabrina Holder is a 39 y.o. female.   39 yo female with a c/o wheezing due to asthma flare up. States she's been having intermittent wheezing for the past month and recently ran out of some of her asthma medications. Denies any fevers, chills, congestion, vomiting, diarrhea. States she was tested for covid 1 month ago and negative.    Asthma    Past Medical History:  Diagnosis Date  . Anxiety   . Asthma   . Environmental allergies   . Renal disorder     Patient Active Problem List   Diagnosis Date Noted  . Class 2 obesity due to excess calories with body mass index (BMI) of 39.0 to 39.9 in adult 11/05/2017  . Prediabetes 11/05/2017  . Moderate asthma with acute exacerbation 03/29/2017    Past Surgical History:  Procedure Laterality Date  . CESAREAN SECTION    . TUBAL LIGATION      OB History    Gravida  5   Para  5   Term  5   Preterm      AB      Living        SAB      TAB      Ectopic      Multiple      Live Births               Home Medications    Prior to Admission medications   Medication Sig Start Date End Date Taking? Authorizing Provider  escitalopram (LEXAPRO) 20 MG tablet TAKE 1 TABLET BY MOUTH ONCE DAILY FOR MOOD 09/03/17  Yes [provider]  fexofenadine-pseudoephedrine (ALLEGRA-D 24) 180-240 MG 24 hr tablet Take 1 tablet by mouth daily.   Yes [provider]  fluticasone (FLONASE) 50 MCG/ACT nasal spray Place into the nose.   Yes [provider]  fluticasone-salmeterol (ADVAIR HFA) 230-21 MCG/ACT inhaler Inhale 2 puffs into the lungs 2 (two) times daily.   Yes [provider]  ranitidine (ZANTAC) 300 MG tablet Take 300 mg by mouth. 03/29/17 04/27/19 Yes [provider]  Spacer/Aero-Holding Chambers  (AEROCHAMBER PLUS) inhaler Use as instructed 03/09/17  Yes Melynda Ripple, MD  SPIRIVA RESPIMAT 1.25 MCG/ACT AERS TAKE 2 PUFFS BY MOUTH TWICE A DAY 07/16/18  Yes Flora Lipps, MD  albuterol (VENTOLIN HFA) 108 (90 Base) MCG/ACT inhaler 1-2 puffs q4-6 hours prn 04/27/19   Norval Gable, MD  ipratropium-albuterol (DUONEB) 0.5-2.5 (3) MG/3ML SOLN Take 3 mLs by nebulization every 6 (six) hours as needed for up to 20 days. 04/27/19 05/17/19  Norval Gable, MD  montelukast (SINGULAIR) 10 MG tablet Take 1 tablet (10 mg total) by mouth at bedtime. 04/27/19   Norval Gable, MD  predniSONE (DELTASONE) 20 MG tablet 3 tabs po qd x 2 days, then 2 tabs po qd x 3 days, then 1 tab po qd x 3 days, then half a tab po qd x 2 days 04/27/19   Norval Gable, MD  ipratropium (ATROVENT) 0.06 % nasal spray Place 2 sprays into both nostrils 4 (four) times daily. 3-4 times/ day 02/24/17 12/08/18  Melynda Ripple, MD  sertraline (ZOLOFT) 50 MG tablet Take 1 tablet (50 mg total) by mouth daily. 01/11/16 12/08/18  Norval Gable, MD    Family  History Family History  Problem Relation Age of Onset  . Asthma Mother   . Gout Father     Social History Social History   Tobacco Use  . Smoking status: Current Some Day Smoker    Types: Cigarettes    Last attempt to quit: 12/23/2017    Years since quitting: 1.3  . Smokeless tobacco: Never Used  . Tobacco comment: smokes one cigarette occassionally  Substance Use Topics  . Alcohol use: No  . Drug use: No     Allergies   Patient has no known allergies.   Review of Systems Review of Systems   Physical Exam Triage Vital Signs ED Triage Vitals  Enc Vitals Group     BP 04/27/19 1620 (!) 139/103     Pulse Rate 04/27/19 1620 92     Resp --      Temp 04/27/19 1620 98.8 F (37.1 C)     Temp Source 04/27/19 1620 Oral     SpO2 04/27/19 1620 96 %     Weight 04/27/19 1616 240 lb (108.9 kg)     Height 04/27/19 1616 5\' 4"  (1.626 m)     Head Circumference --      Peak  Flow --      Pain Score 04/27/19 1616 0     Pain Loc --      Pain Edu? --      Excl. in [place]? --    No data found.  Updated Vital Signs BP (!) 139/103 (BP Location: Left Arm)   Pulse 92   Temp 98.8 F (37.1 C) (Oral)   Ht 5\' 4"  (1.626 m)   Wt 108.9 kg   LMP 03/27/2019   SpO2 96%   BMI 41.20 kg/m   Visual Acuity Right Eye Distance:   Left Eye Distance:   Bilateral Distance:    Right Eye Near:   Left Eye Near:    Bilateral Near:     Physical Exam Vitals and nursing note reviewed.  Constitutional:      General: She is not in acute distress.    Appearance: She is not toxic-appearing or diaphoretic.  Cardiovascular:     Rate and Rhythm: Normal rate.     Heart sounds: Normal heart sounds.  Pulmonary:     Effort: Pulmonary effort is normal. No respiratory distress.     Breath sounds: Wheezing (diffuse, bilateral) present.  Neurological:     Mental Status: She is alert.      UC Treatments / Results  Labs (all labs ordered are listed, but only abnormal results are displayed) Labs Reviewed - No data to display  EKG   Radiology No results found.  Procedures Procedures (including critical care time)  Medications Ordered in UC Medications  methylPREDNISolone sodium succinate (SOLU-MEDROL) 125 mg/2 mL injection 125 mg (125 mg Intramuscular Given 04/27/19 1646)    Initial Impression / Assessment and Plan / UC Course  I have reviewed the triage vital signs and the nursing notes.  Pertinent labs & imaging results that were available during my care of the patient were reviewed by me and considered in my medical decision making (see chart for details).     Final Clinical Impressions(s) / UC Diagnoses   Final diagnoses:  Moderate persistent asthma with exacerbation     Discharge Instructions     Follow up with Primary Care Provider Go to Emergency Department if symptoms worsen    ED Prescriptions    Medication Sig Dispense Auth. Provider  albuterol  (VENTOLIN HFA) 108 (90 Base) MCG/ACT inhaler 1-2 puffs q4-6 hours prn 8 g Cristie Mckinney, MD   predniSONE (DELTASONE) 20 MG tablet 3 tabs po qd x 2 days, then 2 tabs po qd x 3 days, then 1 tab po qd x 3 days, then half a tab po qd x 2 days 16 tablet Zyona Pettaway, MD   ipratropium-albuterol (DUONEB) 0.5-2.5 (3) MG/3ML SOLN Take 3 mLs by nebulization every 6 (six) hours as needed for up to 20 days. 240 mL Reino Lybbert, Linward Foster, MD   montelukast (SINGULAIR) 10 MG tablet Take 1 tablet (10 mg total) by mouth at bedtime. 30 tablet Norval Gable, MD     1. diagnosis reviewed with patient; patient given solumedrol 125mg  IM x1 2. rx as per orders above; reviewed possible side effects, interactions, risks and benefits  3. Recommend supportive treatment with increased fluids 4. Go to Emergency Department if symptoms worsen  5. Follow-up prn   PDMP not reviewed this encounter.   Norval Gable, MD 04/29/19 1344

## 2019-06-18 ENCOUNTER — Encounter: Payer: Self-pay | Admitting: Emergency Medicine

## 2019-06-18 ENCOUNTER — Emergency Department
Admission: EM | Admit: 2019-06-18 | Discharge: 2019-06-18 | Disposition: A | Discharge status: Home / Self Care | Payer: Self-pay | Attending: Emergency Medicine | Admitting: Emergency Medicine

## 2019-06-18 ENCOUNTER — Other Ambulatory Visit: Payer: Self-pay

## 2019-06-18 DIAGNOSIS — Z79899 Other long term (current) drug therapy: Secondary | ICD-10-CM | POA: Insufficient documentation

## 2019-06-18 DIAGNOSIS — F17210 Nicotine dependence, cigarettes, uncomplicated: Secondary | ICD-10-CM | POA: Insufficient documentation

## 2019-06-18 DIAGNOSIS — J4541 Moderate persistent asthma with (acute) exacerbation: Secondary | ICD-10-CM | POA: Insufficient documentation

## 2019-06-18 MED ORDER — ALBUTEROL SULFATE HFA 108 (90 BASE) MCG/ACT IN AERS: 2.0000 | INHALATION_SPRAY | Freq: Four times a day (QID) | RESPIRATORY_TRACT | 0 refills | Status: DC | PRN

## 2019-06-18 MED ORDER — PREDNISONE 20 MG PO TABS
60.0000 mg | ORAL_TABLET | Freq: Once | ORAL | Status: AC
  Administered 2019-06-18: 60 mg via ORAL
  Filled 2019-06-18: qty 3

## 2019-06-18 MED ORDER — ALBUTEROL SULFATE (2.5 MG/3ML) 0.083% IN NEBU: 2.5000 mg | INHALATION_SOLUTION | Freq: Four times a day (QID) | RESPIRATORY_TRACT | 12 refills | Status: DC | PRN

## 2019-06-18 MED ORDER — IPRATROPIUM-ALBUTEROL 0.5-2.5 (3) MG/3ML IN SOLN
6.0000 mL | Freq: Once | RESPIRATORY_TRACT | Status: AC
  Administered 2019-06-18: 6 mL via RESPIRATORY_TRACT
  Filled 2019-06-18: qty 6

## 2019-06-18 MED ORDER — METHYLPREDNISOLONE SODIUM SUCC 125 MG IJ SOLR: 125.0000 mg | Freq: Once | INTRAMUSCULAR | Status: DC

## 2019-06-18 MED ORDER — PREDNISONE 50 MG PO TABS: ORAL_TABLET | ORAL | 0 refills | Status: DC

## 2019-06-18 MED ORDER — IPRATROPIUM-ALBUTEROL 0.5-2.5 (3) MG/3ML IN SOLN
3.0000 mL | Freq: Once | RESPIRATORY_TRACT | Status: AC
  Administered 2019-06-18: 3 mL via RESPIRATORY_TRACT
  Filled 2019-06-18: qty 3

## 2019-06-18 NOTE — ED Provider Notes (Signed)
Emergency Department Provider Note  ____________________________________________  Time seen: Approximately 11:22 PM  I have reviewed the triage vital signs and the nursing notes.   HISTORY  Chief Complaint Asthma   Historian Patient     HPI Sabrina Holder is a 39 y.o. female with a history of asthma, presents to the emergency department with shortness of breath and wheezing.  Patient states that she has been running low on her albuterol inhaler.  She denies current chest tightness.  She states that is been at least 2 to 3 months since she has been on prednisone for asthma.  No other alleviating measures have been attempted.  She has been afebrile at home.   Past Medical History:  Diagnosis Date  . Anxiety   . Asthma   . Environmental allergies   . Renal disorder      Immunizations up to date:  Yes.     Past Medical History:  Diagnosis Date  . Anxiety   . Asthma   . Environmental allergies   . Renal disorder     Patient Active Problem List   Diagnosis Date Noted  . Class 2 obesity due to excess calories with body mass index (BMI) of 39.0 to 39.9 in adult 11/05/2017  . Prediabetes 11/05/2017  . Moderate asthma with acute exacerbation 03/29/2017    Past Surgical History:  Procedure Laterality Date  . CESAREAN SECTION    . TUBAL LIGATION      Prior to Admission medications   Medication Sig Start Date End Date Taking? Authorizing Provider  albuterol (PROVENTIL) (2.5 MG/3ML) 0.083% nebulizer solution Take 3 mLs (2.5 mg total) by nebulization every 6 (six) hours as needed for wheezing or shortness of breath. 06/18/19   Lannie Fields, PA-C  albuterol (VENTOLIN HFA) 108 (90 Base) MCG/ACT inhaler Inhale 2 puffs into the lungs every 6 (six) hours as needed for wheezing or shortness of breath. 06/18/19   Lannie Fields, PA-C  escitalopram (LEXAPRO) 20 MG tablet TAKE 1 TABLET BY MOUTH ONCE DAILY FOR MOOD 09/03/17   [provider]   fexofenadine-pseudoephedrine (ALLEGRA-D 24) 180-240 MG 24 hr tablet Take 1 tablet by mouth daily.    [provider]  fluticasone (FLONASE) 50 MCG/ACT nasal spray Place into the nose.    [provider]  fluticasone-salmeterol (ADVAIR HFA) 230-21 MCG/ACT inhaler Inhale 2 puffs into the lungs 2 (two) times daily.    [provider]  ipratropium-albuterol (DUONEB) 0.5-2.5 (3) MG/3ML SOLN Take 3 mLs by nebulization every 6 (six) hours as needed for up to 20 days. 04/27/19 05/17/19  Norval Gable, MD  montelukast (SINGULAIR) 10 MG tablet Take 1 tablet (10 mg total) by mouth at bedtime. 04/27/19   Norval Gable, MD  predniSONE (DELTASONE) 50 MG tablet Take one tablet once daily for the next five days. 06/18/19   Lannie Fields, PA-C  ranitidine (ZANTAC) 300 MG tablet Take 300 mg by mouth. 03/29/17 04/27/19  [provider]  Spacer/Aero-Holding Chambers (AEROCHAMBER PLUS) inhaler Use as instructed 03/09/17   Melynda Ripple, MD  SPIRIVA RESPIMAT 1.25 MCG/ACT AERS TAKE 2 PUFFS BY MOUTH TWICE A DAY 07/16/18   Flora Lipps, MD  ipratropium (ATROVENT) 0.06 % nasal spray Place 2 sprays into both nostrils 4 (four) times daily. 3-4 times/ day 02/24/17 12/08/18  Melynda Ripple, MD  sertraline (ZOLOFT) 50 MG tablet Take 1 tablet (50 mg total) by mouth daily. 01/11/16 12/08/18  Norval Gable, MD    Allergies Patient has no known  allergies.  Family History  Problem Relation Age of Onset  . Asthma Mother   . Gout Father     Social History Social History   Tobacco Use  . Smoking status: Current Some Day Smoker    Types: Cigarettes    Last attempt to quit: 12/23/2017    Years since quitting: 1.4  . Smokeless tobacco: Never Used  . Tobacco comment: smokes one cigarette occassionally  Substance Use Topics  . Alcohol use: No  . Drug use: No     Review of Systems  Constitutional: No fever/chills Eyes:  No discharge ENT: No upper respiratory  complaints. Respiratory: Patient has shortness of breath and wheezing. Gastrointestinal:   No nausea, no vomiting.  No diarrhea.  No constipation. Musculoskeletal: Negative for musculoskeletal pain. Skin: Negative for rash, abrasions, lacerations, ecchymosis.    ____________________________________________   PHYSICAL EXAM:  VITAL SIGNS: ED Triage Vitals  Enc Vitals Group     BP 06/18/19 2027 132/77     Pulse Rate 06/18/19 2027 (!) 104     Resp 06/18/19 2027 20     Temp 06/18/19 2027 98.4 F (36.9 C)     Temp Source 06/18/19 2027 Oral     SpO2 06/18/19 2027 97 %     Weight 06/18/19 2031 240 lb (108.9 kg)     Height 06/18/19 2031 5\' 4"  (1.626 m)     Head Circumference --      Peak Flow --      Pain Score 06/18/19 2031 0     Pain Loc --      Pain Edu? --      Excl. in [place]? --      Constitutional: Alert and oriented. Well appearing and in no acute distress. Eyes: Conjunctivae are normal. PERRL. EOMI. Head: Atraumatic. ENT:      Nose: No congestion/rhinnorhea.      Mouth/Throat: Mucous membranes are moist.  Neck: No stridor.  No cervical spine tenderness to palpation. Cardiovascular: Normal rate, regular rhythm. Normal S1 and S2.  Good peripheral circulation. Respiratory: No accessory muscle use for respiration.  Patient has diffuse wheezing auscultated bilaterally.  There is air entry to the bases bilaterally. Gastrointestinal: Bowel sounds x 4 quadrants. Soft and nontender to palpation. No guarding or rigidity. No distention. Musculoskeletal: Full range of motion to all extremities. No obvious deformities noted Neurologic:  Normal for age. No gross focal neurologic deficits are appreciated.  Skin:  Skin is warm, dry and intact. No rash noted. Psychiatric: Mood and affect are normal for age. Speech and behavior are normal.   ____________________________________________   LABS (all labs ordered are listed, but only abnormal results are displayed)  Labs Reviewed - No  data to display ____________________________________________  EKG   ____________________________________________  RADIOLOGY   No results found.  ____________________________________________    PROCEDURES  Procedure(s) performed:     Procedures     Medications  methylPREDNISolone sodium succinate (SOLU-MEDROL) 125 mg/2 mL injection 125 mg (has no administration in time range)  ipratropium-albuterol (DUONEB) 0.5-2.5 (3) MG/3ML nebulizer solution 6 mL (6 mLs Nebulization Given 06/18/19 2123)  predniSONE (DELTASONE) tablet 60 mg (60 mg Oral Given 06/18/19 2212)  ipratropium-albuterol (DUONEB) 0.5-2.5 (3) MG/3ML nebulizer solution 3 mL (3 mLs Nebulization Given 06/18/19 2212)     ____________________________________________   INITIAL IMPRESSION / ASSESSMENT AND PLAN / ED COURSE  Pertinent labs & imaging results that were available during my care of the patient were reviewed by me and considered in my medical  decision making (see chart for details).    Assessment and plan Asthma 39 year old female presents to the emergency department with increased work of breathing and wheezing over the past 2 days.  Patient was mildly tachycardic and tachypneic at triage.  On physical exam, patient was sitting upright without accessory muscles for respiration.  She had diffuse wheezing auscultated bilaterally but did have good air entry to the lung bases.  Patient was given 2 DuoNeb's in the emergency department as well as oral prednisone.  Your wheezing improved significantly although she did have mild wheezing that was still auscultated upon third recheck.  Patient was started on a prednisone burst.  I prescribed her nebulized albuterol and albuterol inhaler.  Patient was given strict return precautions to return to the emergency department with new or worsening symptoms.  All patient questions were answered.   ____________________________________________  FINAL CLINICAL  IMPRESSION(S) / ED DIAGNOSES  Final diagnoses:  Moderate persistent asthma with exacerbation      NEW MEDICATIONS STARTED DURING THIS VISIT:  ED Discharge Orders         Ordered    albuterol (PROVENTIL) (2.5 MG/3ML) 0.083% nebulizer solution  Every 6 hours PRN     06/18/19 2310    albuterol (VENTOLIN HFA) 108 (90 Base) MCG/ACT inhaler  Every 6 hours PRN     06/18/19 2310    predniSONE (DELTASONE) 50 MG tablet     06/18/19 2310              This chart was dictated using voice recognition software/Dragon. Despite best efforts to proofread, errors can occur which can change the meaning. Any change was purely unintentional.     Lannie Fields, PA-C 06/18/19 2327    Arta Silence, MD 06/18/19 [phone]

## 2019-06-18 NOTE — ED Triage Notes (Signed)
Patient ambulatory to triage with steady gait, without difficulty or distress noted, mask in place; pt reports nonprod cough today with some wheezes; currently using albuterol inhaler without relief and ran out of her duoneb

## 2019-10-16 ENCOUNTER — Encounter: Payer: Self-pay | Admitting: Emergency Medicine

## 2019-10-16 ENCOUNTER — Other Ambulatory Visit: Payer: Self-pay

## 2019-10-16 ENCOUNTER — Ambulatory Visit
Admission: EM | Admit: 2019-10-16 | Discharge: 2019-10-16 | Disposition: A | Discharge status: Home / Self Care | Payer: Medicaid Other | Attending: Emergency Medicine | Admitting: Emergency Medicine

## 2019-10-16 DIAGNOSIS — R102 Pelvic and perineal pain: Secondary | ICD-10-CM | POA: Insufficient documentation

## 2019-10-16 DIAGNOSIS — D649 Anemia, unspecified: Secondary | ICD-10-CM | POA: Insufficient documentation

## 2019-10-16 DIAGNOSIS — N760 Acute vaginitis: Secondary | ICD-10-CM | POA: Diagnosis present

## 2019-10-16 DIAGNOSIS — B9689 Other specified bacterial agents as the cause of diseases classified elsewhere: Secondary | ICD-10-CM | POA: Insufficient documentation

## 2019-10-16 LAB — URINALYSIS, COMPLETE (UACMP) WITH MICROSCOPIC
Bilirubin Urine: NEGATIVE
Glucose, UA: NEGATIVE mg/dL
Ketones, ur: NEGATIVE mg/dL
Leukocytes,Ua: NEGATIVE
Nitrite: NEGATIVE
Protein, ur: NEGATIVE mg/dL
Specific Gravity, Urine: >1.03 — ABNORMAL HIGH (ref 1.005–1.030)
pH: 5.5 (ref 5.0–8.0)

## 2019-10-16 LAB — BASIC METABOLIC PANEL
Anion gap: 9 (ref 5–15)
BUN: 12 mg/dL (ref 6–20)
CO2: 22 mmol/L (ref 22–32)
Calcium: 8.6 mg/dL — ABNORMAL LOW (ref 8.9–10.3)
Chloride: 104 mmol/L (ref 98–111)
Creatinine, Ser: 0.72 mg/dL (ref 0.44–1.00)
GFR calc Af Amer: >60 mL/min (ref >60)
GFR calc non Af Amer: >60 mL/min (ref >60)
Glucose, Bld: 116 mg/dL — ABNORMAL HIGH (ref 70–99)
Potassium: 4.1 mmol/L (ref 3.5–5.1)
Sodium: 135 mmol/L (ref 135–145)

## 2019-10-16 LAB — CBC WITH DIFFERENTIAL/PLATELET
Abs Immature Granulocytes: 0.02 10*3/uL (ref 0.00–0.07)
Basophils Absolute: 0 10*3/uL (ref 0.0–0.1)
Basophils Relative: 1 %
Eosinophils Absolute: 0.5 10*3/uL (ref 0.0–0.5)
Eosinophils Relative: 7 %
HCT: 28.6 % — ABNORMAL LOW (ref 36.0–46.0)
Hemoglobin: 8.5 g/dL — ABNORMAL LOW (ref 12.0–15.0)
Immature Granulocytes: 0 %
Lymphocytes Relative: 20 %
Lymphs Abs: 1.4 10*3/uL (ref 0.7–4.0)
MCH: 22.1 pg — ABNORMAL LOW (ref 26.0–34.0)
MCHC: 29.7 g/dL — ABNORMAL LOW (ref 30.0–36.0)
MCV: 74.5 fL — ABNORMAL LOW (ref 80.0–100.0)
Monocytes Absolute: 0.5 10*3/uL (ref 0.1–1.0)
Monocytes Relative: 7 %
Neutro Abs: 4.7 10*3/uL (ref 1.7–7.7)
Neutrophils Relative %: 65 %
Platelets: 344 10*3/uL (ref 150–400)
RBC: 3.84 MIL/uL — ABNORMAL LOW (ref 3.87–5.11)
RDW: 17.9 % — ABNORMAL HIGH (ref 11.5–15.5)
WBC: 7.2 10*3/uL (ref 4.0–10.5)
nRBC: 0 % (ref 0.0–0.2)

## 2019-10-16 LAB — CHLAMYDIA/NGC RT PCR (ARMC ONLY)
Chlamydia Tr: NOT DETECTED
N gonorrhoeae: NOT DETECTED

## 2019-10-16 LAB — WET PREP, GENITAL
Sperm: NONE SEEN
Trich, Wet Prep: NONE SEEN
Yeast Wet Prep HPF POC: NONE SEEN

## 2019-10-16 LAB — PREGNANCY, URINE: Preg Test, Ur: NEGATIVE

## 2019-10-16 MED ORDER — METRONIDAZOLE 500 MG PO TABS: 500.0000 mg | ORAL_TABLET | Freq: Two times a day (BID) | ORAL | 0 refills | Status: AC

## 2019-10-16 MED ORDER — IBUPROFEN 600 MG PO TABS: 600.0000 mg | ORAL_TABLET | Freq: Four times a day (QID) | ORAL | 0 refills | Status: DC | PRN

## 2019-10-16 MED ORDER — FERROUS SULFATE 325 (65 FE) MG PO TABS: 325.0000 mg | ORAL_TABLET | Freq: Every day | ORAL | 0 refills | Status: DC

## 2019-10-16 NOTE — Discharge Instructions (Addendum)
Your hemoglobin today is 8.5, your baseline is around 10.  I suspect that this is from your recent heavy menses.  I am going to start you on iron supplements, and increase your intake of iron rich foods.  This will take some time to get back to your baseline, you will need to follow-up with OB/GYN ASAP for further management of this.  You also need an outpatient ultrasound to further identify the cause of your pelvic pain.  There does not seem to be an emergency today.  You do have bacterial vaginosis.  I am sending you home with Flagyl which will treat this.  It may be contributing to your pelvic pain.  Finish the Flagyl, even if you feel better.  Take 600 mg of ibuprofen combined with 1000 mg of Tylenol together 3 or 4 times a day as needed for pain.

## 2019-10-16 NOTE — ED Triage Notes (Signed)
Patient c/o lower back and abdominal pain that started 2 months ago.  Patient denies N/V/D.  Patient reports loose stools for the past 2 days.  Patient denies fevers. Patient also reports urinary frequency and urgency.

## 2019-10-16 NOTE — ED Provider Notes (Signed)
HPI  SUBJECTIVE:  Sabrina Holder is a 39 y.o. female who presents with worsening daily constant waxing and waning burning, achy bilateral pelvic pain for the past month.  States that she has had intermittently for "years", but that it has become more intense after having a very heavy and painful menstrual cycle this month.  Reports generalized weakness and a strong craving for ice.  She reports 3 weeks of urinary urgency, frequency, polyuria, polydipsia.  She reports intermittent abnormal vaginal bleeding is having her period 2 weeks ago.  She states that she normally does not spot in between menses.  No nausea, vomiting, fevers.  No dysuria, cloudy or odorous urine, hematuria.  No unintentional weight loss, blurry vision.  Had loose bowel movement yesterday, but no melena, hematochezia.  She denies vaginal odor, new or different discharge, vaginal itching, genital rash.  She is in a long-term monogamous relationship with her husband who is asymptomatic.  STDs are not a concern today.  She reports dyspareunia.  No antipyretic in the past 6 hours.  She has tried warm baths, Tylenol.  Symptoms are worse with wearing tight clothing, walking, coughing, palpation of her lower abdomen, turning around in bed.  No alleviating factors.  She has never had a work-up for this.  She has a past medical history of anemia status post transfusion, asthma.  No history of PID, endometriosis, PCOS, ovarian cyst, gonorrhea, chlamydia, HIV, HSV, trichomonas, yeast, diabetes, hypertension, coagulopathy, anticoagulant/antiplatelet use, UTI, pyelonephritis, nephrolithiasis.  She has a history of BV, she is status post bilateral tubal ligation and C-section x3.  PMD: [facility].   Past Medical History:  Diagnosis Date  . Anxiety   . Asthma   . Environmental allergies   . Renal disorder     Past Surgical History:  Procedure Laterality Date  . CESAREAN SECTION    . TUBAL LIGATION      Family History  Problem  Relation Age of Onset  . Asthma Mother   . Gout Father     Social History   Tobacco Use  . Smoking status: Current Some Day Smoker    Types: Cigarettes    Last attempt to quit: 12/23/2017    Years since quitting: 1.8  . Smokeless tobacco: Never Used  . Tobacco comment: smokes one cigarette occassionally  Vaping Use  . Vaping Use: Never used  Substance Use Topics  . Alcohol use: No  . Drug use: No    No current facility-administered medications for this encounter.  Current Outpatient Medications:  .  albuterol (VENTOLIN HFA) 108 (90 Base) MCG/ACT inhaler, Inhale 2 puffs into the lungs every 6 (six) hours as needed for wheezing or shortness of breath., Disp: 6.7 g, Rfl: 0 .  escitalopram (LEXAPRO) 20 MG tablet, TAKE 1 TABLET BY MOUTH ONCE DAILY FOR MOOD, Disp: , Rfl: 1 .  fexofenadine-pseudoephedrine (ALLEGRA-D 24) 180-240 MG 24 hr tablet, Take 1 tablet by mouth daily., Disp: , Rfl:  .  fluticasone (FLONASE) 50 MCG/ACT nasal spray, Place into the nose., Disp: , Rfl:  .  fluticasone-salmeterol (ADVAIR HFA) 230-21 MCG/ACT inhaler, Inhale 2 puffs into the lungs 2 (two) times daily., Disp: , Rfl:  .  montelukast (SINGULAIR) 10 MG tablet, Take 1 tablet (10 mg total) by mouth at bedtime., Disp: 30 tablet, Rfl: 0 .  ranitidine (ZANTAC) 300 MG tablet, Take 300 mg by mouth., Disp: , Rfl:  .  SPIRIVA RESPIMAT 1.25 MCG/ACT AERS, TAKE 2 PUFFS BY MOUTH TWICE A DAY,  Disp: 3 Inhaler, Rfl: 2 .  albuterol (PROVENTIL) (2.5 MG/3ML) 0.083% nebulizer solution, Take 3 mLs (2.5 mg total) by nebulization every 6 (six) hours as needed for wheezing or shortness of breath., Disp: 75 mL, Rfl: 12 .  ferrous sulfate 325 (65 FE) MG tablet, Take 1 tablet (325 mg total) by mouth daily., Disp: 30 tablet, Rfl: 0 .  ibuprofen (ADVIL) 600 MG tablet, Take 1 tablet (600 mg total) by mouth every 6 (six) hours as needed., Disp: 30 tablet, Rfl: 0 .  ipratropium-albuterol (DUONEB) 0.5-2.5 (3) MG/3ML SOLN, Take 3 mLs by  nebulization every 6 (six) hours as needed for up to 20 days., Disp: 240 mL, Rfl: 0 .  metroNIDAZOLE (FLAGYL) 500 MG tablet, Take 1 tablet (500 mg total) by mouth 2 (two) times daily for 7 days., Disp: 14 tablet, Rfl: 0 .  Spacer/Aero-Holding Chambers (AEROCHAMBER PLUS) inhaler, Use as instructed, Disp: 1 each, Rfl: 2  No Known Allergies   ROS  As noted in HPI.   Physical Exam  BP (!) 150/97 (BP Location: Left Arm)   Pulse 100   Temp 98.5 F (36.9 C) (Oral)   Resp 16   Ht 5\' 4"  (1.626 m)   Wt 111.6 kg   LMP 10/02/2019 (Approximate)   SpO2 95%   BMI 42.23 kg/m   Constitutional: Well developed, well nourished, no acute distress Eyes:  EOMI, conjunctiva normal bilaterally HENT: Normocephalic, atraumatic,mucus membranes moist Respiratory: Normal inspiratory effort Cardiovascular: Borderline mild tachycardia. GI: . No suprapubic tenderness  back: Normal appearance.  Positive suprapubic, right lower quadrant, left lower quadrant tenderness.  No guarding, rebound.  Tenderness worse on the left than the right.  Active bowel sounds.  No distention.  No other abdominal tenderness.  No guarding, rebound.  Negative tap table test. Back: No CVA tenderness GU: External genitalia normal.  Normal vaginal mucosa.  Normal os. Thin  nonoderous  White  vaginal discharge.   Uterus smooth, tender . No CMT. + left adnexal tenderness. No adnexal masses.  Chaperone present during exam skin: No rash, skin intact Musculoskeletal: no deformities Neurologic: Alert & oriented x 3, no focal neuro deficits Psychiatric: Speech and behavior appropriate   ED Course   Medications - No data to display  Orders Placed This Encounter  Procedures  . Pelvic exam    Standing Status:   Standing    Number of Occurrences:   1  . Wet prep, genital    Standing Status:   Standing    Number of Occurrences:   1  . [place] rt PCR ([place] only)    Standing Status:   Standing    Number of Occurrences:   1  .  Urine culture    Standing Status:   Standing    Number of Occurrences:   1  . Urinalysis, Complete w Microscopic    Standing Status:   Standing    Number of Occurrences:   1  . CBC with Differential    Standing Status:   Standing    Number of Occurrences:   1  . Basic metabolic panel    Standing Status:   Standing    Number of Occurrences:   1  . Pregnancy, urine    Standing Status:   Standing    Number of Occurrences:   1  . Ambulatory referral to Obstetrics / Gynecology    Referral Priority:   Routine    Referral Type:   Consultation    Referral Reason:  Specialty Services Required    Requested Specialty:   Obstetrics and Gynecology    Number of Visits Requested:   1    Results for orders placed or performed during the hospital encounter of 10/16/19 (from the past 24 hour(s))  Urinalysis, Complete w Microscopic     Status: Abnormal   Collection Time: 10/16/19  8:45 AM  Result Value Ref Range   Color, Urine YELLOW YELLOW   APPearance CLEAR CLEAR   Specific Gravity, Urine >1.030 (H) 1.005 - 1.030   pH 5.5 5.0 - 8.0   Glucose, UA NEGATIVE NEGATIVE mg/dL   Hgb urine dipstick TRACE (A) NEGATIVE   Bilirubin Urine NEGATIVE NEGATIVE   Ketones, ur NEGATIVE NEGATIVE mg/dL   Protein, ur NEGATIVE NEGATIVE mg/dL   Nitrite NEGATIVE NEGATIVE   Leukocytes,Ua NEGATIVE NEGATIVE   Squamous Epithelial / LPF 0-5 0 - 5   WBC, UA 0-5 0 - 5 WBC/hpf   RBC / HPF 0-5 0 - 5 RBC/hpf   Bacteria, UA RARE (A) NONE SEEN   Mucus PRESENT    Hyaline Casts, UA PRESENT   Pregnancy, urine     Status: None   Collection Time: 10/16/19  8:45 AM  Result Value Ref Range   Preg Test, Ur NEGATIVE NEGATIVE  CBC with Differential     Status: Abnormal   Collection Time: 10/16/19  9:22 AM  Result Value Ref Range   WBC 7.2 4.0 - 10.5 K/uL   RBC 3.84 (L) 3.87 - 5.11 MIL/uL   Hemoglobin 8.5 (L) 12.0 - 15.0 g/dL   HCT 28.6 (L) 36 - 46 %   MCV 74.5 (L) 80.0 - 100.0 fL   MCH 22.1 (L) 26.0 - 34.0 pg   MCHC 29.7  (L) 30.0 - 36.0 g/dL   RDW 17.9 (H) 11.5 - 15.5 %   Platelets 344 150 - 400 K/uL   nRBC 0.0 0.0 - 0.2 %   Neutrophils Relative % 65 %   Neutro Abs 4.7 1.7 - 7.7 K/uL   Lymphocytes Relative 20 %   Lymphs Abs 1.4 0.7 - 4.0 K/uL   Monocytes Relative 7 %   Monocytes Absolute 0.5 0 - 1 K/uL   Eosinophils Relative 7 %   Eosinophils Absolute 0.5 0 - 0 K/uL   Basophils Relative 1 %   Basophils Absolute 0.0 0 - 0 K/uL   Immature Granulocytes 0 %   Abs Immature Granulocytes 0.02 0.00 - 0.07 K/uL  Basic metabolic panel     Status: Abnormal   Collection Time: 10/16/19  9:22 AM  Result Value Ref Range   Sodium 135 135 - 145 mmol/L   Potassium 4.1 3.5 - 5.1 mmol/L   Chloride 104 98 - 111 mmol/L   CO2 22 22 - 32 mmol/L   Glucose, Bld 116 (H) 70 - 99 mg/dL   BUN 12 6 - 20 mg/dL   Creatinine, Ser 0.72 0.44 - 1.00 mg/dL   Calcium 8.6 (L) 8.9 - 10.3 mg/dL   GFR calc non Af Amer >60 >60 mL/min   GFR calc Af Amer >60 >60 mL/min   Anion gap 9 5 - 15  Wet prep, genital     Status: Abnormal   Collection Time: 10/16/19  9:22 AM   Specimen: Vaginal; Genital  Result Value Ref Range   Yeast Wet Prep HPF POC NONE SEEN NONE SEEN   Trich, Wet Prep NONE SEEN NONE SEEN   Clue Cells Wet Prep HPF POC PRESENT (A) NONE SEEN  WBC, Wet Prep HPF POC FEW (A) NONE SEEN   Sperm NONE SEEN    No results found.  ED Clinical Impression  1. Pelvic pain   2. BV (bacterial vaginosis)   3. Anemia, unspecified type      ED Assessment/Plan  Patient with pelvic pain for years, getting worse over the past month after having a particularly heavy and painful period.  She reports weakness, craving ice, will check CBC for acute anemia, BMP also because she is reporting polyuria and polydipsia.  Also checking wet prep, gonorrhea, chlamydia, UA, urine pregnancy.  Doubt torsion as this has been going on for "years" and has been constantly present daily for the past month.  Doubt appendicitis or diverticulitis because it is  bilateral and due to duration of symptoms.  Doubt ureteral obstruction or complicated urinary tract infection given duration of symptoms.  Her urine is negative for UTI.  She has rare bacteria and some blood.  No evidence of PID.  Doubt TOA.  Urine pregnancy negative.  Wet prep positive for BV.  Home with Flagyl.  UA with some blood, bacteria, not completely consistent with UTI.  Will send off for culture prior to initiating antibiotic treatment for UTI.    1.  Pelvic pain.  Could be endometriosis, ovarian cyst.  Doubt PCOS as her periods are fairly regular.  There is no evidence of ectopic pregnancy or other pelvic emergency.  Will refer to OB/GYN for an outpatient ultrasound and further management.  We will add ibuprofen 600 mg combined with 1000 mg of Tylenol together 3-4 times a day as needed for pain.  To the ER if she gets worse.  2 BV.  Home with Flagyl.  3. Fatigue-patient is anemic at 8.5.  Suspect from recent heavy menses.  Previous hemoglobin 6 months ago was 10.4.  Her baseline seems to be around 10.  No indications for transfusion, however will start patient on iron supplements.  Will need to follow-up with GYN about this.  Placed referral to [facility].  Also Gave information on the women's center in [place].  Discussed labs, treatment plan, medical decision making, plan for follow-up with patient.  Discussed signs and symptoms that should prompt return to the emergency department.  She agrees with plan.  Meds ordered this encounter  Medications  . metroNIDAZOLE (FLAGYL) 500 MG tablet    Sig: Take 1 tablet (500 mg total) by mouth 2 (two) times daily for 7 days.    Dispense:  14 tablet    Refill:  0  . ibuprofen (ADVIL) 600 MG tablet    Sig: Take 1 tablet (600 mg total) by mouth every 6 (six) hours as needed.    Dispense:  30 tablet    Refill:  0  . ferrous sulfate 325 (65 FE) MG tablet    Sig: Take 1 tablet (325 mg total) by mouth daily.    Dispense:  30 tablet     Refill:  0    *This clinic note was created using [employer]. Therefore, there may be occasional mistakes despite careful proofreading.  ?     Melynda Ripple, MD 10/16/19 1053

## 2019-10-17 LAB — URINE CULTURE: Culture: NO GROWTH

## 2019-11-05 ENCOUNTER — Encounter: Payer: Self-pay | Admitting: Obstetrics and Gynecology

## 2019-11-11 IMAGING — CR DG CHEST 2V
2 series · 2 of 2 positions shown · non-contrast
Comparison: 02/12/2016

CLINICAL DATA: Patient complains of asthma and troubled breathing
for a few days. Patient denies any chance of pregnancy and was
properly shielded.

EXAM:
CHEST  2 VIEW

[chest pa]
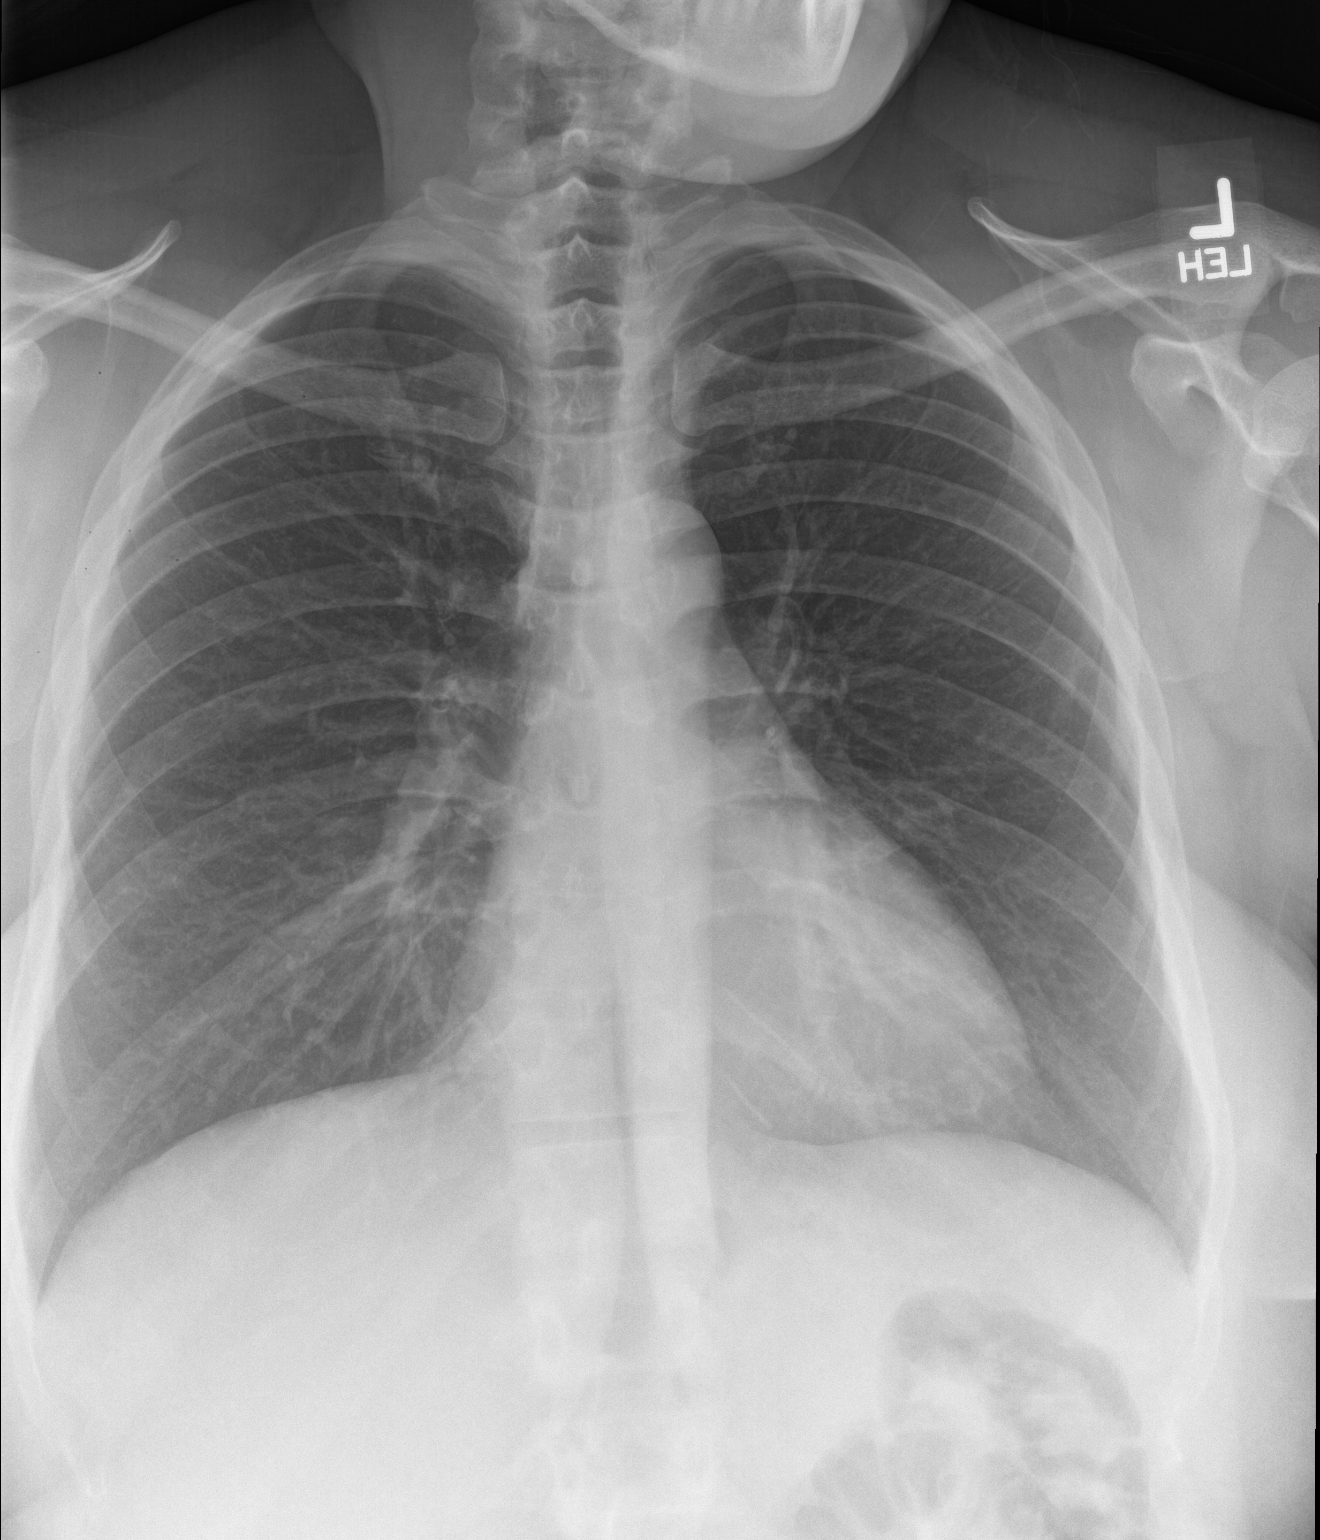

[chest lat]
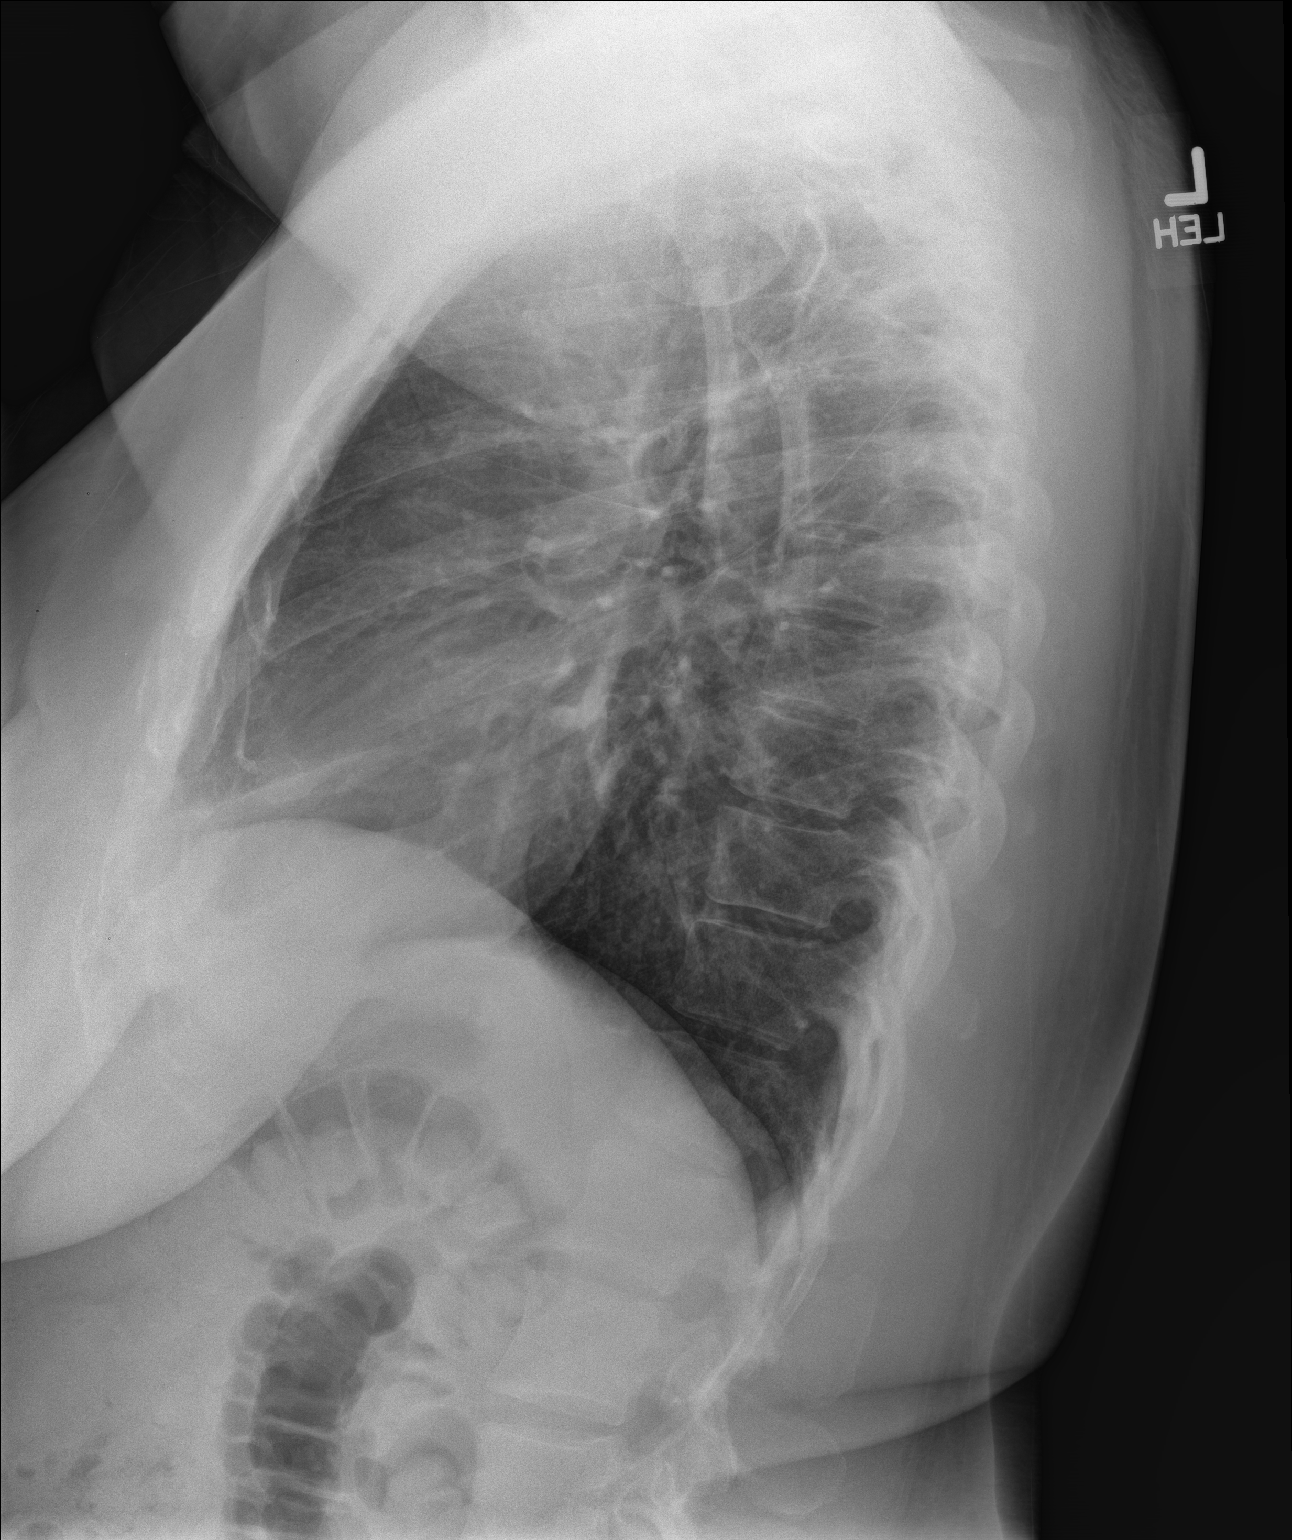

[2 of 2 positions shown; findings below may reference images not displayed]

FINDINGS: Normal mediastinum and cardiac silhouette. Normal pulmonary
vasculature. No evidence of effusion, infiltrate, or pneumothorax.
No acute bony abnormality.
IMPRESSION: Normal chest radiograph

## 2019-11-19 ENCOUNTER — Other Ambulatory Visit: Payer: Self-pay

## 2019-11-19 ENCOUNTER — Other Ambulatory Visit (HOSPITAL_COMMUNITY)
Admission: RE | Admit: 2019-11-19 | Discharge: 2019-11-19 | Disposition: A | Discharge status: Home / Self Care | Payer: Medicaid Other | Source: Ambulatory Visit | Attending: Obstetrics and Gynecology | Admitting: Obstetrics and Gynecology

## 2019-11-19 ENCOUNTER — Encounter: Payer: Self-pay | Admitting: Obstetrics and Gynecology

## 2019-11-19 ENCOUNTER — Ambulatory Visit (INDEPENDENT_AMBULATORY_CARE_PROVIDER_SITE_OTHER): Payer: Medicaid Other | Admitting: Obstetrics and Gynecology

## 2019-11-19 VITALS — BP 146/86 | Ht 64.0 in | Wt 244.0 lb

## 2019-11-19 DIAGNOSIS — Z124 Encounter for screening for malignant neoplasm of cervix: Secondary | ICD-10-CM | POA: Insufficient documentation

## 2019-11-19 DIAGNOSIS — N920 Excessive and frequent menstruation with regular cycle: Secondary | ICD-10-CM | POA: Insufficient documentation

## 2019-11-19 DIAGNOSIS — D500 Iron deficiency anemia secondary to blood loss (chronic): Secondary | ICD-10-CM

## 2019-11-19 NOTE — Progress Notes (Signed)
Obstetrics & Gynecology Office Visit   Chief Complaint  Patient presents with   Referral    Pelvic pain in entire area and lower back, heavy and long cycles    History of Present Illness: 39 y.o. [id number] female who is referred by the ED for the above.  She presents for heavy periods and abdominal pain.  She has had heavy periods for years.  She normally has periods coming at a regular interval, monthly, lasting about 15 days.  She passes big clots.  She feels so much pressure during her periods that she has trouble standing up.  She has to wear a diaper-like pad.  In the past she has tried Depo provera, birth control pills.  She has tried Actuary. None of these things have helped.  She states that it seems like it made the bleeding worse. She also states that it made her gain a lot of weight.  The bleeding has become worse over time, too.  She has had to have a blood transfusion. She feels very weak due to this.  She has ice pica.    She also has lower abdominal pain.  She describes the pain as at the top of her pelvic bones on both sides and radiates around to her back. The pain is in the middle a little but, but mostly on her sides.  She describes the pain as severe.  The pain is sharp, "like something is torn in that area."   Aggravating factors: movement, coughing, pushing on her belly, having a bowel movement.  Alleviating factors: nothing.  Associated symptoms: none.   The pain has been present for years (mabye 15 years).  She states that the pain itself has gotten worse.   She doesn't remember the last time she had a pap smear.  She denies weight loss. She notes bloating.  She has a history of constipation since she has been taking iron pills. She has had negative STD screening. She had BV last month, which was treated. She had a negative urine culture. She has had no imaging.   Past Medical History:  Diagnosis Date   Anxiety    Asthma    Environmental allergies    Renal  disorder     Past Surgical History:  Procedure Laterality Date   CESAREAN SECTION     TUBAL LIGATION      Gynecologic History: Patient's last menstrual period was 10/20/2019 (approximate).  Obstetric History: [id number], cesarean section x 3.  SVD x 2.   Family History  Problem Relation Age of Onset   Asthma Mother    Gout Father    Cancer Maternal Grandmother 53       unknown type   Uterine cancer Neg Hx    Ovarian cancer Neg Hx    Breast cancer Neg Hx     Social History   Socioeconomic History   Marital status: Married    Spouse name: Not on file   Number of children: Not on file   Years of education: Not on file   Highest education level: Not on file  Occupational History   Not on file  Tobacco Use   Smoking status: Current Some Day Smoker    Packs/day: 0.50    Types: Cigarettes    Last attempt to quit: 12/23/2017    Years since quitting: 1.9   Smokeless tobacco: Never Used   Tobacco comment: smokes one cigarette occassionally  Vaping Use   Vaping Use: Never used  Substance and  Sexual Activity   Alcohol use: Yes    Comment: occasional (once weekly)   Drug use: No   Sexual activity: Yes    Birth control/protection: Surgical    Comment: Tubal ligation  Other Topics Concern   Not on file  Social History Narrative   Not on file   Social Determinants of Health   Financial Resource Strain:    Difficulty of Paying Living Expenses: Not on file  Food Insecurity:    Worried About [place] in the Last Year: Not on file   Ran Out of Food in the Last Year: Not on file  Transportation Needs:    Lack of Transportation (Medical): Not on file   Lack of Transportation (Non-Medical): Not on file  Physical Activity:    Days of Exercise per Week: Not on file   Minutes of Exercise per Session: Not on file  Stress:    Feeling of Stress : Not on file  Social Connections:    Frequency of Communication with Friends and Family: Not  on file   Frequency of Social Gatherings with Friends and Family: Not on file   Attends Religious Services: Not on file   Active Member of Clubs or Organizations: Not on file   Attends Archivist Meetings: Not on file   Marital Status: Not on file  Intimate Partner Violence:    Fear of Current or Ex-Partner: Not on file   Emotionally Abused: Not on file   Physically Abused: Not on file   Sexually Abused: Not on file    No Known Allergies  Prior to Admission medications   Medication Sig Start Date End Date Taking? Authorizing Provider  albuterol (PROVENTIL) (2.5 MG/3ML) 0.083% nebulizer solution Take 3 mLs (2.5 mg total) by nebulization every 6 (six) hours as needed for wheezing or shortness of breath. 06/18/19  Yes Vallarie Mare M, PA-C  albuterol (VENTOLIN HFA) 108 (90 Base) MCG/ACT inhaler Inhale 2 puffs into the lungs every 6 (six) hours as needed for wheezing or shortness of breath. 06/18/19  Yes Vallarie Mare M, PA-C  buPROPion (WELLBUTRIN XL) 300 MG 24 hr tablet Take 300 mg by mouth daily. 08/11/19  Yes [provider]  escitalopram (LEXAPRO) 20 MG tablet TAKE 1 TABLET BY MOUTH ONCE DAILY FOR MOOD 09/03/17  Yes [provider]  ferrous sulfate 325 (65 FE) MG tablet Take 1 tablet (325 mg total) by mouth daily. 10/16/19  Yes Melynda Ripple, MD  fexofenadine-pseudoephedrine (ALLEGRA-D 24) 180-240 MG 24 hr tablet Take 1 tablet by mouth daily.   Yes [provider]  fluticasone (FLONASE) 50 MCG/ACT nasal spray Place into the nose.   Yes [provider]  fluticasone-salmeterol (ADVAIR HFA) 230-21 MCG/ACT inhaler Inhale 2 puffs into the lungs 2 (two) times daily.   Yes [provider]  ibuprofen (ADVIL) 600 MG tablet Take 1 tablet (600 mg total) by mouth every 6 (six) hours as needed. 10/16/19  Yes Melynda Ripple, MD  montelukast (SINGULAIR) 10 MG tablet Take 1 tablet (10 mg total) by mouth at bedtime. 04/27/19  Yes Norval Gable, MD  omeprazole (PRILOSEC) 20 MG capsule Take 20 mg by mouth daily. 08/11/19  Yes [provider]  Spacer/Aero-Holding Chambers (AEROCHAMBER PLUS) inhaler Use as instructed 03/09/17  Yes Melynda Ripple, MD  SPIRIVA RESPIMAT 1.25 MCG/ACT AERS TAKE 2 PUFFS BY MOUTH TWICE A DAY 07/16/18  Yes Kasa, Maretta Bees, MD  ipratropium-albuterol (DUONEB) 0.5-2.5 (3) MG/3ML SOLN Take 3 mLs by nebulization every  6 (six) hours as needed for up to 20 days. 04/27/19 05/17/19  Norval Gable, MD  ipratropium (ATROVENT) 0.06 % nasal spray Place 2 sprays into both nostrils 4 (four) times daily. 3-4 times/ day 02/24/17 12/08/18  Melynda Ripple, MD  sertraline (ZOLOFT) 50 MG tablet Take 1 tablet (50 mg total) by mouth daily. 01/11/16 12/08/18  Norval Gable, MD    Review of Systems  Constitutional: Positive for malaise/fatigue. Negative for chills, diaphoresis, fever and weight loss.  HENT: Negative.   Eyes: Negative.   Respiratory: Negative.   Cardiovascular: Negative.   Gastrointestinal: Positive for abdominal pain and constipation. Negative for blood in stool, diarrhea, heartburn, melena, nausea and vomiting.  Genitourinary: Negative.  Negative for hematuria.  Musculoskeletal: Negative.   Skin: Negative.   Neurological: Negative.   Psychiatric/Behavioral: Negative.      Physical Exam BP (!) 146/86    Ht 5\' 4"  (1.626 m)    Wt 244 lb (110.7 kg)    LMP 10/20/2019 (Approximate)    BMI 41.88 kg/m  Patient's last menstrual period was 10/20/2019 (approximate). Physical Exam Constitutional:      General: She is not in acute distress.    Appearance: Normal appearance. She is well-developed.  Genitourinary:     Pelvic exam was performed with patient in the lithotomy position.     Vulva, inguinal canal, urethra, vagina and cervix normal.     Uterus is enlarged, irregular and mobile.     Uterus is not tender.     Right and left adnexa are non-palpable.  HENT:     Head: Normocephalic and atraumatic.    Eyes:     General: No scleral icterus.    Conjunctiva/sclera: Conjunctivae normal.  Cardiovascular:     Rate and Rhythm: Normal rate and regular rhythm.     Heart sounds: No murmur heard.  No friction rub. No gallop.   Pulmonary:     Effort: Pulmonary effort is normal. No respiratory distress.     Breath sounds: Normal breath sounds. No wheezing or rales.  Abdominal:     General: Bowel sounds are normal. There is no distension.     Palpations: Abdomen is soft. There is no mass.     Tenderness: There is no abdominal tenderness. There is no guarding or rebound.  Musculoskeletal:        General: Normal range of motion.     Cervical back: Normal range of motion and neck supple.  Neurological:     General: No focal deficit present.     Mental Status: She is alert and oriented to person, place, and time.     Cranial Nerves: No cranial nerve deficit.  Skin:    General: Skin is warm and dry.     Findings: No erythema.  Psychiatric:        Mood and Affect: Mood normal.        Behavior: Behavior normal.        Judgment: Judgment normal.     Female chaperone present for pelvic and breast  portions of the physical exam  Assessment: 39 y.o. [id number] female here for  1. Menorrhagia with regular cycle   2. Anemia due to chronic blood loss   3. Pap smear for cervical cancer screening      Plan: Problem List Items Addressed This Visit    None    Visit Diagnoses    Menorrhagia with regular cycle    -  Primary   Relevant Orders   US PELVIS  TRANSVAGINAL NON-OB (TV ONLY)   Cytology - PAP   Anemia due to chronic blood loss       Relevant Orders   US PELVIS TRANSVAGINAL NON-OB (TV ONLY)   Pap smear for cervical cancer screening       Relevant Orders   US PELVIS TRANSVAGINAL NON-OB (TV ONLY)   Cytology - PAP     Discussed management options for abnormal uterine bleeding including expectant, NSAIDs, tranexamic acid (Lysteda), oral progesterone (Provera, norethindrone, megace), Depo  Provera, Levonorgestrel containing IUD, endometrial ablation (Novasure) or hysterectomy as definitive surgical management.  Discussed risks and benefits of each method.   Final management decision will hinge on results of patient's work up and whether an underlying etiology for the patients bleeding symptoms can be discerned.  We will conduct a basic work up examining using the PALM-COIEN classification system.  Bleeding precautions reviewed.   Will determine the need for an endometrial biopsy after ultrasound.  She intimates that she strongly desires hysterectomy.    Prentice Docker, MD 11/19/2019 9:45 AM

## 2019-11-23 LAB — CYTOLOGY - PAP
Comment: NEGATIVE
Diagnosis: NEGATIVE
High risk HPV: NEGATIVE

## 2019-12-01 ENCOUNTER — Ambulatory Visit (INDEPENDENT_AMBULATORY_CARE_PROVIDER_SITE_OTHER): Payer: Medicaid Other | Admitting: Obstetrics and Gynecology

## 2019-12-01 ENCOUNTER — Ambulatory Visit: Payer: Medicaid Other | Admitting: Obstetrics and Gynecology

## 2019-12-01 ENCOUNTER — Ambulatory Visit (INDEPENDENT_AMBULATORY_CARE_PROVIDER_SITE_OTHER): Payer: Medicaid Other

## 2019-12-01 ENCOUNTER — Other Ambulatory Visit (HOSPITAL_COMMUNITY)
Admission: RE | Admit: 2019-12-01 | Discharge: 2019-12-01 | Disposition: A | Discharge status: Home / Self Care | Payer: Medicaid Other | Source: Ambulatory Visit | Attending: Obstetrics and Gynecology | Admitting: Obstetrics and Gynecology

## 2019-12-01 ENCOUNTER — Other Ambulatory Visit: Payer: Self-pay

## 2019-12-01 ENCOUNTER — Other Ambulatory Visit: Payer: Self-pay | Admitting: Obstetrics and Gynecology

## 2019-12-01 ENCOUNTER — Telehealth: Payer: Self-pay | Admitting: Obstetrics and Gynecology

## 2019-12-01 ENCOUNTER — Encounter: Payer: Self-pay | Admitting: Obstetrics and Gynecology

## 2019-12-01 VITALS — BP 148/88 | Ht 64.0 in | Wt 240.0 lb

## 2019-12-01 DIAGNOSIS — N920 Excessive and frequent menstruation with regular cycle: Secondary | ICD-10-CM

## 2019-12-01 DIAGNOSIS — D500 Iron deficiency anemia secondary to blood loss (chronic): Secondary | ICD-10-CM

## 2019-12-01 DIAGNOSIS — Z124 Encounter for screening for malignant neoplasm of cervix: Secondary | ICD-10-CM | POA: Diagnosis not present

## 2019-12-01 NOTE — Progress Notes (Signed)
Gynecology Ultrasound Follow Up   Chief Complaint  Patient presents with  . Follow-up  Ultrasound for menorrhagia with regular cycle   History of Present Illness: Patient is a 39 y.o. female who presents today for ultrasound evaluation of the above .  Ultrasound demonstrates the following findings Adnexa: small left ovarian simple cyst Uterus: anteverted with endometrial stripe  3.7 mm (though hard to measure due to contents of uterine cavity) Additional: blood clots versus endometrial polyp  She is bleeding today. Discussed polypectomy, if present, versus hysterectomy. She states that she has already been through a similar treatment and it didn't help. She is still wanting a hysterectomy.   Recent pap smear: normal Recent STD screen: negative Endometrial biopsy: none.  Will perform today.   Past Medical History:  Diagnosis Date  . Anxiety   . Asthma   . Environmental allergies   . Renal disorder     Past Surgical History:  Procedure Laterality Date  . CESAREAN SECTION    . TUBAL LIGATION      Family History  Problem Relation Age of Onset  . Asthma Mother   . Gout Father   . Cancer Maternal Grandmother 43       unknown type  . Uterine cancer Neg Hx   . Ovarian cancer Neg Hx   . Breast cancer Neg Hx     Social History   Socioeconomic History  . Marital status: Married    Spouse name: Not on file  . Number of children: Not on file  . Years of education: Not on file  . Highest education level: Not on file  Occupational History  . Not on file  Tobacco Use  . Smoking status: Current Some Day Smoker    Packs/day: 0.50    Types: Cigarettes    Last attempt to quit: 12/23/2017    Years since quitting: 1.9  . Smokeless tobacco: Never Used  . Tobacco comment: smokes one cigarette occassionally  Vaping Use  . Vaping Use: Never used  Substance and Sexual Activity  . Alcohol use: Yes    Comment: occasional (once weekly)  . Drug use: No  . Sexual activity:  Yes    Birth control/protection: Surgical    Comment: Tubal ligation  Other Topics Concern  . Not on file  Social History Narrative  . Not on file   Social Determinants of Health   Financial Resource Strain:   . Difficulty of Paying Living Expenses: Not on file  Food Insecurity:   . Worried About Charity fundraiser in the Last Year: Not on file  . Ran Out of Food in the Last Year: Not on file  Transportation Needs:   . Lack of Transportation (Medical): Not on file  . Lack of Transportation (Non-Medical): Not on file  Physical Activity:   . Days of Exercise per Week: Not on file  . Minutes of Exercise per Session: Not on file  Stress:   . Feeling of Stress : Not on file  Social Connections:   . Frequency of Communication with Friends and Family: Not on file  . Frequency of Social Gatherings with Friends and Family: Not on file  . Attends Religious Services: Not on file  . Active Member of Clubs or Organizations: Not on file  . Attends Archivist Meetings: Not on file  . Marital Status: Not on file  Intimate Partner Violence:   . Fear of Current or Ex-Partner: Not on file  . Emotionally  Abused: Not on file  . Physically Abused: Not on file  . Sexually Abused: Not on file    No Known Allergies  Prior to Admission medications   Medication Sig Start Date End Date Taking? Authorizing Provider  albuterol (PROVENTIL) (2.5 MG/3ML) 0.083% nebulizer solution Take 3 mLs (2.5 mg total) by nebulization every 6 (six) hours as needed for wheezing or shortness of breath. 06/18/19   Lannie Fields, PA-C  albuterol (VENTOLIN HFA) 108 (90 Base) MCG/ACT inhaler Inhale 2 puffs into the lungs every 6 (six) hours as needed for wheezing or shortness of breath. 06/18/19   Lannie Fields, PA-C  buPROPion (WELLBUTRIN XL) 300 MG 24 hr tablet Take 300 mg by mouth daily. 08/11/19   [provider]  escitalopram (LEXAPRO) 20 MG tablet TAKE 1 TABLET BY MOUTH ONCE DAILY FOR MOOD 09/03/17    [provider]  ferrous sulfate 325 (65 FE) MG tablet Take 1 tablet (325 mg total) by mouth daily. 10/16/19   Melynda Ripple, MD  fexofenadine-pseudoephedrine (ALLEGRA-D 24) 180-240 MG 24 hr tablet Take 1 tablet by mouth daily.    [provider]  fluticasone (FLONASE) 50 MCG/ACT nasal spray Place into the nose.    [provider]  fluticasone-salmeterol (ADVAIR HFA) 230-21 MCG/ACT inhaler Inhale 2 puffs into the lungs 2 (two) times daily.    [provider]  ibuprofen (ADVIL) 600 MG tablet Take 1 tablet (600 mg total) by mouth every 6 (six) hours as needed. 10/16/19   Melynda Ripple, MD  ipratropium-albuterol (DUONEB) 0.5-2.5 (3) MG/3ML SOLN Take 3 mLs by nebulization every 6 (six) hours as needed for up to 20 days. 04/27/19 05/17/19  Norval Gable, MD  montelukast (SINGULAIR) 10 MG tablet Take 1 tablet (10 mg total) by mouth at bedtime. 04/27/19   Norval Gable, MD  omeprazole (PRILOSEC) 20 MG capsule Take 20 mg by mouth daily. 08/11/19   [provider]  Spacer/Aero-Holding Chambers (AEROCHAMBER PLUS) inhaler Use as instructed 03/09/17   Melynda Ripple, MD  SPIRIVA RESPIMAT 1.25 MCG/ACT AERS TAKE 2 PUFFS BY MOUTH TWICE A DAY 07/16/18   Flora Lipps, MD  ipratropium (ATROVENT) 0.06 % nasal spray Place 2 sprays into both nostrils 4 (four) times daily. 3-4 times/ day 02/24/17 12/08/18  Melynda Ripple, MD  sertraline (ZOLOFT) 50 MG tablet Take 1 tablet (50 mg total) by mouth daily. 01/11/16 12/08/18  Norval Gable, MD    Physical Exam BP (!) 148/88   Ht 5\' 4"  (1.626 m)   Wt 240 lb (108.9 kg)   BMI 41.20 kg/m    General: NAD HEENT: normocephalic, anicteric Pulmonary: No increased work of breathing Extremities: no edema, erythema, or tenderness Neurologic: Grossly intact, normal gait Psychiatric: mood appropriate, affect full  Endometrial Biopsy After discussion with the patient regarding her abnormal uterine bleeding I recommended that she  proceed with an endometrial biopsy for further diagnosis. The risks, benefits, alternatives, and indications for an endometrial biopsy were discussed with the patient in detail. She understood the risks including infection, bleeding, cervical laceration and uterine perforation.  Verbal consent was obtained.   PROCEDURE NOTE:  [place] endometrial biopsy was performed using aseptic technique with iodine preparation.  The uterus was sounded to a length of 10 cm.  Adequate sampling was obtained with minimal blood loss.  The patient tolerated the procedure well.  Disposition will be pending pathology.  Imaging Results US PELVIC COMPLETE WITH TRANSVAGINAL  Result Date: 12/01/2019 Patient Name: Sabrina Holder DOB: 03-19-1981 MRN: [id number]  ULTRASOUND REPORT Location: [facility] Date of Service: 12/01/2019 Indications:Abnormal Uterine Bleeding Findings: The uterus is anteverted and measures 10.0 x 6.2 x 5.0 cm. Echo texture is heterogeneous without evidence of focal masses. The Endometrium measures 3.7 mm, questionably. There is echogenic material and blood within the endometrium canal. Endometrial polyps can not be ruled out. The echogenic portion is within the entire endometrial canal. It measures between 6.7 mm and 10.1 mm at the C-section scar. Right Ovary measures 3.2 x 2.8 x 2.2  cm. It is normal in appearance. Left Ovary measures 4.0 x 2.5 x 3.3  cm. It is normal in appearance. There is a simple cyst in the left adnexa measuring 17.3 x 12.4 x 12.1 mm. Survey of the adnexa demonstrates no adnexal masses. There is no free fluid in the cul de sac. Impression: 1. Endometrial polyps can not be ruled out. There is blood within the endometrial canal as well. 2. The uterus is heterogeneous. No fibroids are seen. 3. Normal appearing ovaries. 4. There is a small simple cyst in the left adnexa. Gweneth Dimitri, RT The ultrasound images and findings were reviewed by me and I agree with the above report. Prentice Docker, MD, [facility], [facility] 12/01/2019 2:07 PM       Assessment: 39 y.o. [id number]  1. Menorrhagia with regular cycle   2. Anemia due to chronic blood loss      Plan: Problem List Items Addressed This Visit    None    Visit Diagnoses    Menorrhagia with regular cycle    -  Primary   Relevant Orders   Surgical pathology   Anemia due to chronic blood loss       Relevant Orders   Surgical pathology     Discussed ultrasound, pap smear, and exam findings.  Discussed possibility of endometrial polyp. Endometrial biopsy today without difficulty.  She greatly desires hysterectomy. We reviewed medical treatment options.  Discussed that if she has a polyp present, this could be removed with a simple procedure.  She indicated that she only wants a hysterectomy.  Will make sure biopsy is negative and will proceed with total laparoscopic hysterectomy, bilateral salpingectomy, cystoscopy.  Patient readily agrees with this plan.  A total of 20 minutes were spent face-to-face with the patient as well as preparation, review, communication, and documentation during this encounter.    Prentice Docker, MD, [facility], [facility] 12/01/2019 2:21 PM      ICD-10-CM   1. Menorrhagia with regular cycle  N92.0 Surgical pathology  2. Anemia due to chronic blood loss  D50.0 Surgical pathology

## 2019-12-01 NOTE — Telephone Encounter (Signed)
-----   Message from Will Bonnet, MD sent at 12/01/2019  2:22 PM EDT ----- Regarding: Schedule surgery Surgery Booking Request Patient Full Name:  Sabrina Holder  MRN: [id number]  DOB: November 14, 1980  Surgeon: Prentice Docker, MD  Requested Surgery Date and Time: TBD Primary Diagnosis AND Code:  1) Menorrhagia with regular cycle [N92.0] 2) Anemia due to chronic blood loss [D50.0] Secondary Diagnosis and Code:  Surgical Procedure: Total Laparoscopic hysterectomy, bilateral salpingectomy, cystoscopy RNFA Requested?: No L&D Notification: No Admission Status: same day surgery Length of Surgery: 125 min Special Case Needs: No H&P: Yes Phone Interview???:  Yes Interpreter: No Medical Clearance:  No Special Scheduling Instructions: No Any known health/anesthesia issues, diabetes, sleep apnea, latex allergy, defibrillator/pacemaker?: No Acuity: P2   (P1 highest, P2 delay may cause harm, P3 low, elective gyn, P4 lowest)

## 2019-12-01 NOTE — Telephone Encounter (Signed)
Called patient to schedule TLH/BS & cystoscopy w Kendra Opitz 9/14  H&P 9/7 @ 4:30   Covid testing 9/10 @ 8-10:30, [facility] [employer], drive up and wear mask. Advised pt to quarantine until DOS.  Pre-admit phone call appointment to be requested - date and time will be included on H&P paper work. Also all appointments will be updated on pt MyChart. Explained that this appointment has a call window. Based on the time scheduled will indicate if the call will be received within a 4 hour window before 1:00 or after.  Advised that pt may also receive calls from the hospital pharmacy and pre-service center.  Confirmed pt has Medicaid [employer] as primary insurance. No secondary insurance.  Pt will need to sign HYST consent for Medicaid.

## 2019-12-03 LAB — SURGICAL PATHOLOGY

## 2019-12-08 ENCOUNTER — Encounter: Payer: Self-pay | Admitting: Obstetrics and Gynecology

## 2019-12-08 ENCOUNTER — Other Ambulatory Visit: Payer: Self-pay

## 2019-12-08 ENCOUNTER — Ambulatory Visit (INDEPENDENT_AMBULATORY_CARE_PROVIDER_SITE_OTHER): Payer: Medicaid Other | Admitting: Obstetrics and Gynecology

## 2019-12-08 VITALS — BP 142/94 | Ht 64.0 in | Wt 243.0 lb

## 2019-12-08 DIAGNOSIS — D500 Iron deficiency anemia secondary to blood loss (chronic): Secondary | ICD-10-CM

## 2019-12-08 DIAGNOSIS — N920 Excessive and frequent menstruation with regular cycle: Secondary | ICD-10-CM

## 2019-12-08 NOTE — Progress Notes (Signed)
Preoperative History and Physical  Sabrina Holder is a 39 y.o. [id number] here for surgical management of Menorrhagia with regular cycle and anemia due to chronic blood loss.   No significant preoperative concerns.  History of Present Illness: Sabrina Holder female who is referred by the ED for the above.  She presents for heavy periods and abdominal pain.  She has had heavy periods for years.  She normally has periods coming at a regular interval, monthly, lasting about 15 days.  She passes big clots.  She feels so much pressure during her periods that she has trouble standing up.  She has to wear a diaper-like pad.  In the past she has tried Depo provera, birth control pills.  She has tried Actuary. None of these things have helped.  She states that it seems like it made the bleeding worse. She also states that it made her gain a lot of weight.  The bleeding has become worse over time, too.  She has had to have a blood transfusion. She feels very weak due to this.  She has ice pica.    She also has lower abdominal pain.  She describes the pain as at the top of her pelvic bones on both sides and radiates around to her back. The pain is in the middle a little but, but mostly on her sides.  She describes the pain as severe.  The pain is sharp, "like something is torn in that area."   Aggravating factors: movement, coughing, pushing on her belly, having a bowel movement.  Alleviating factors: nothing.  Associated symptoms: none.   The pain has been present for years (mabye 15 years).  She states that the pain itself has gotten worse.   She doesn't remember the last time she had a pap smear.  She denies weight loss. She notes bloating.  She has a history of constipation since she has been taking iron pills. She has had negative STD screening. She had BV last month, which was treated. She had a negative urine culture.   Pelvic u/s on 8/31: uterus anteverted measuring 10 x 6.2 x 5 cm, no focal  masses, Endometrial stripe 3.7 mm, ?polyps.  Simple cyst in left adnexa, measuring < 2 cm in any dimension.   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, no malignancy  Proposed surgery: Total laparoscopic hysterectomy, bilateral salpingectomy, cystoscopy  Past Medical History:  Diagnosis Date   Anxiety    Asthma    Environmental allergies    Renal disorder    Past Surgical History:  Procedure Laterality Date   CESAREAN SECTION     TUBAL LIGATION     OB History  Gravida Para Term Preterm AB Living  6 5 5   1 5   SAB TAB Ectopic Multiple Live Births    1     5    # Outcome Date GA Lbr Len/2nd Weight Sex Delivery Anes PTL Lv  6 TAB           5 Term           4 Term           3 Term           2 Term           1 Term           Patient denies any other pertinent  gynecologic issues.   Current Outpatient Medications on File Prior to Visit  Medication Sig Dispense Refill   albuterol (PROVENTIL) (2.5 MG/3ML) 0.083% nebulizer solution Take 3 mLs (2.5 mg total) by nebulization every 6 (six) hours as needed for wheezing or shortness of breath. 75 mL 12   albuterol (VENTOLIN HFA) 108 (90 Base) MCG/ACT inhaler Inhale 2 puffs into the lungs every 6 (six) hours as needed for wheezing or shortness of breath. 6.7 g 0   buPROPion (WELLBUTRIN XL) 300 MG 24 hr tablet Take 300 mg by mouth daily.     escitalopram (LEXAPRO) 20 MG tablet TAKE 1 TABLET BY MOUTH ONCE DAILY FOR MOOD  1   ferrous sulfate 325 (65 FE) MG tablet Take 1 tablet (325 mg total) by mouth daily. 30 tablet 0   fexofenadine-pseudoephedrine (ALLEGRA-D 24) 180-240 MG 24 hr tablet Take 1 tablet by mouth daily.     fluticasone (FLONASE) 50 MCG/ACT nasal spray Place into the nose.     Fluticasone-Salmeterol (ADVAIR DISKUS) 500-50 MCG/DOSE AEPB Inhale 1 puff into the lungs 2 (two) times daily.      ibuprofen (ADVIL) 600 MG tablet Take 1 tablet (600 mg total) by mouth every 6 (six) hours as needed. 30 tablet 0   ipratropium-albuterol (DUONEB) 0.5-2.5 (3) MG/3ML SOLN Take 3 mLs by nebulization every 6 (six) hours as needed for up to 20 days. (Patient taking differently: Take 3 mLs by nebulization every 6 (six) hours as needed (wheezing/shortness of breath.). ) 240 mL 0   montelukast (SINGULAIR) 10 MG tablet Take 1 tablet (10 mg total) by mouth at bedtime. (Patient taking differently: Take 10 mg by mouth daily. ) 30 tablet 0   omeprazole (PRILOSEC) 20 MG capsule Take 20 mg by mouth daily.     Spacer/Aero-Holding Chambers (AEROCHAMBER PLUS) inhaler Use as instructed 1 each 2   SPIRIVA RESPIMAT 1.25 MCG/ACT AERS TAKE 2 PUFFS BY MOUTH TWICE A DAY 3 Inhaler 2   [DISCONTINUED] ipratropium (ATROVENT) 0.06 % nasal spray Place 2 sprays into both nostrils 4 (four) times daily. 3-4 times/ day 15 mL 0   [DISCONTINUED] sertraline (ZOLOFT) 50 MG tablet Take 1 tablet (50 mg total) by mouth daily. 20 tablet 0   No current facility-administered medications on file prior to visit.   No Known Allergies  Social History:   reports that she has been smoking cigarettes. She has been smoking about 0.50 packs per day. She has never used smokeless tobacco. She reports current alcohol use. She reports that she does not use drugs.  Family History  Problem Relation Age of Onset   Asthma Mother    Gout Father    Cancer Maternal Grandmother 54       unknown type   Uterine cancer Neg Hx    Ovarian cancer Neg Hx    Breast cancer Neg Hx     Review of Systems: Noncontributory  PHYSICAL EXAM: Blood pressure (!) 142/94, height 5\' 4"  (1.626 m), weight 243 lb (110.2 kg). CONSTITUTIONAL: Well-developed, well-nourished female in no acute distress.  HENT:  Normocephalic, atraumatic, External right and left ear normal. Oropharynx is clear and moist EYES: Conjunctivae and EOM are normal. Pupils are equal,  round, and reactive to light. No scleral icterus.  NECK: Normal range of motion, supple, no masses SKIN: Skin is warm and dry. No rash noted. Not diaphoretic. No erythema. No pallor. [place]: Alert and oriented to person, place, and time. Normal reflexes, muscle tone coordination. No cranial nerve deficit noted. PSYCHIATRIC:  Normal mood and affect. Normal behavior. Normal judgment and thought content. CARDIOVASCULAR: Normal heart rate noted, regular rhythm RESPIRATORY: Effort and breath sounds normal, no problems with respiration noted ABDOMEN: Soft, nontender, nondistended. PELVIC: Deferred MUSCULOSKELETAL: Normal range of motion. No edema and no tenderness. 2+ distal pulses.  Labs: Results for orders placed or performed in visit on 12/01/19 (from the past 336 hour(s))  Surgical pathology   Collection Time: 12/01/19  2:19 PM  Result Value Ref Range   SURGICAL PATHOLOGY      SURGICAL PATHOLOGY CASE: [id number] PATIENT: Sabrina Holder Surgical Pathology Report     Clinical History: menorrhagia with regular cycle, anemia due to chronic blood loss (cm)   FINAL MICROSCOPIC DIAGNOSIS:  A. 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  The specimen is entirely submitted in 2 cassettes. Craig Staggers 12/03/2019)    Final Diagnosis performed by Thressa Sheller, MD.   Electronically signed 12/03/2019 Technical and / or Professional components performed at [facility]. [facility], [place] [address] [place], [place] [address].  Immunohistochemistry Technical component (if applicable) was performed at [facility]. [address] [place], [place],  [address].   IMMUNOHISTOCHEMISTRY DISCLAIMER (if applicab le): Some of these immunohistochemical  stains may have been developed and the performance characteristics determine by [facility]. Some may not have been cleared or approved by the U.S. Food and Drug Administration. The FDA has determined that such clearance or approval is not necessary. This test is used for clinical purposes. It should not be regarded as investigational or for research. This laboratory is certified under the [employer] (CLIA-88) as qualified to perform high complexity clinical laboratory testing.  The controls stained appropriately.     Imaging Studies: US PELVIC COMPLETE WITH TRANSVAGINAL  Result Date: 12/01/2019 Patient Name: Missy Baksh DOB: Mar 01, 1981 MRN: [id number] ULTRASOUND REPORT Location: [facility] Date of Service: 12/01/2019 Indications:Abnormal Uterine Bleeding Findings: The uterus is anteverted and measures 10.0 x 6.2 x 5.0 cm. Echo texture is heterogeneous without evidence of focal masses. The Endometrium measures 3.7 mm, questionably. There is echogenic material and blood within the endometrium canal. Endometrial polyps can not be ruled out. The echogenic portion is within the entire endometrial canal. It measures between 6.7 mm and 10.1 mm at the C-section scar. Right Ovary measures 3.2 x 2.8 x 2.2  cm. It is normal in appearance. Left Ovary measures 4.0 x 2.5 x 3.3  cm. It is normal in appearance. There is a simple cyst in the left adnexa measuring 17.3 x 12.4 x 12.1 mm. Survey of the adnexa demonstrates no adnexal masses. There is no free fluid in the cul de sac. Impression: 1. Endometrial polyps can not be ruled out. There is blood within the endometrial canal as well. 2. The uterus is heterogeneous. No fibroids are seen. 3. Normal appearing ovaries. 4. There is a small simple cyst in the left adnexa. Gweneth Dimitri, RT The ultrasound images and findings were reviewed by me and I agree with the above report. Prentice Docker, MD, [facility], [facility] 12/01/2019 2:07 PM      Assessment:   ICD-10-CM   1. Menorrhagia with regular cycle  N92.0   2. Anemia due to chronic  blood loss  D50.0      Plan: Patient will undergo surgical management with the above noted surgey.   The risks of surgery were discussed in detail with the patient including but not limited to: bleeding which may require transfusion or reoperation; infection which may require antibiotics; injury to surrounding organs which may involve bowel, bladder, ureters ; need for additional procedures including laparoscopy or laparotomy; thromboembolic phenomenon, surgical site problems and other postoperative/anesthesia complications. Likelihood of success in alleviating the patient's condition was discussed. Routine postoperative instructions will be reviewed with the patient and her family in detail after surgery.  The patient concurred with the proposed plan, giving informed written consent for the surgery.  Preoperative prophylactic antibiotics, as indicated, and SCDs ordered on call to the OR.    Prentice Docker, MD 12/08/2019 4:33 PM

## 2019-12-08 NOTE — H&P (View-Only) (Signed)
Preoperative History and Physical  Sabrina Holder is a 39 y.o. [id number] here for surgical management of Menorrhagia with regular cycle and anemia due to chronic blood loss.   No significant preoperative concerns.  History of Present Illness: 39 y.o. [id number] female who is referred by the ED for the above.  She presents for heavy periods and abdominal pain.  She has had heavy periods for years.  She normally has periods coming at a regular interval, monthly, lasting about 15 days.  She passes big clots.  She feels so much pressure during her periods that she has trouble standing up.  She has to wear a diaper-like pad.  In the past she has tried Depo provera, birth control pills.  She has tried Actuary. None of these things have helped.  She states that it seems like it made the bleeding worse. She also states that it made her gain a lot of weight.  The bleeding has become worse over time, too.  She has had to have a blood transfusion. She feels very weak due to this.  She has ice pica.    She also has lower abdominal pain.  She describes the pain as at the top of her pelvic bones on both sides and radiates around to her back. The pain is in the middle a little but, but mostly on her sides.  She describes the pain as severe.  The pain is sharp, "like something is torn in that area."   Aggravating factors: movement, coughing, pushing on her belly, having a bowel movement.  Alleviating factors: nothing.  Associated symptoms: none.   The pain has been present for years (mabye 15 years).  She states that the pain itself has gotten worse.   She doesn't remember the last time she had a pap smear.  She denies weight loss. She notes bloating.  She has a history of constipation since she has been taking iron pills. She has had negative STD screening. She had BV last month, which was treated. She had a negative urine culture.   Pelvic u/s on 8/31: uterus anteverted measuring 10 x 6.2 x 5 cm, no focal  masses, Endometrial stripe 3.7 mm, ?polyps.  Simple cyst in left adnexa, measuring < 2 cm in any dimension.   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 Anxiety   . Asthma   . Environmental allergies   . Renal disorder    Past Surgical History:  Procedure Laterality Date  . CESAREAN SECTION    . TUBAL LIGATION     OB History  Gravida Para Term Preterm AB Living  6 5 5   1 5   SAB TAB Ectopic Multiple Live Births    1     5    # Outcome Date GA Lbr Len/2nd Weight Sex Delivery Anes PTL Lv  6 TAB           5 Term           4 Term           3 Term           2 Term           1 Term           Patient denies any other pertinent  gynecologic issues.   Current Outpatient Medications on File Prior to Visit  Medication Sig Dispense Refill  . albuterol (PROVENTIL) (2.5 MG/3ML) 0.083% nebulizer solution Take 3 mLs (2.5 mg total) by nebulization every 6 (six) hours as needed for wheezing or shortness of breath. 75 mL 12  . albuterol (VENTOLIN HFA) 108 (90 Base) MCG/ACT inhaler Inhale 2 puffs into the lungs every 6 (six) hours as needed for wheezing or shortness of breath. 6.7 g 0  . buPROPion (WELLBUTRIN XL) 300 MG 24 hr tablet Take 300 mg by mouth daily.    Marland Kitchen escitalopram (LEXAPRO) 20 MG tablet TAKE 1 TABLET BY MOUTH ONCE DAILY FOR MOOD  1  . ferrous sulfate 325 (65 FE) MG tablet Take 1 tablet (325 mg total) by mouth daily. 30 tablet 0  . fexofenadine-pseudoephedrine (ALLEGRA-D 24) 180-240 MG 24 hr tablet Take 1 tablet by mouth daily.    . fluticasone (FLONASE) 50 MCG/ACT nasal spray Place into the nose.    Marland Kitchen Fluticasone-Salmeterol (ADVAIR DISKUS) 500-50 MCG/DOSE AEPB Inhale 1 puff into the lungs 2 (two) times daily.    Marland Kitchen  ibuprofen (ADVIL) 600 MG tablet Take 1 tablet (600 mg total) by mouth every 6 (six) hours as needed. 30 tablet 0  . ipratropium-albuterol (DUONEB) 0.5-2.5 (3) MG/3ML SOLN Take 3 mLs by nebulization every 6 (six) hours as needed for up to 20 days. (Patient taking differently: Take 3 mLs by nebulization every 6 (six) hours as needed (wheezing/shortness of breath.). ) 240 mL 0  . montelukast (SINGULAIR) 10 MG tablet Take 1 tablet (10 mg total) by mouth at bedtime. (Patient taking differently: Take 10 mg by mouth daily. ) 30 tablet 0  . omeprazole (PRILOSEC) 20 MG capsule Take 20 mg by mouth daily.    Marland Kitchen Spacer/Aero-Holding Chambers (AEROCHAMBER PLUS) inhaler Use as instructed 1 each 2  . SPIRIVA RESPIMAT 1.25 MCG/ACT AERS TAKE 2 PUFFS BY MOUTH TWICE A DAY 3 Inhaler 2  . [DISCONTINUED] ipratropium (ATROVENT) 0.06 % nasal spray Place 2 sprays into both nostrils 4 (four) times daily. 3-4 times/ day 15 mL 0  . [DISCONTINUED] sertraline (ZOLOFT) 50 MG tablet Take 1 tablet (50 mg total) by mouth daily. 20 tablet 0   No current facility-administered medications on file prior to visit.   No Known Allergies  Social History:   reports that she has been smoking cigarettes. She has been smoking about 0.50 packs per day. She has never used smokeless tobacco. She reports current alcohol use. She reports that she does not use drugs.  Family History  Problem Relation Age of Onset  . Asthma Mother   . Gout Father   . Cancer Maternal Grandmother 63       unknown type  . Uterine cancer Neg Hx   . Ovarian cancer Neg Hx   . Breast cancer Neg Hx     Review of Systems: Noncontributory  PHYSICAL EXAM: Blood pressure (!) 142/94, height 5\' 4"  (1.626 m), weight 243 lb (110.2 kg). CONSTITUTIONAL: Well-developed, well-nourished female in no acute distress.  HENT:  Normocephalic, atraumatic, External right and left ear normal. Oropharynx is clear and moist EYES: Conjunctivae and EOM are normal. Pupils are equal,  round, and reactive to light. No scleral icterus.  NECK: Normal range of motion, supple, no masses SKIN: Skin is warm and dry. No rash noted. Not diaphoretic. No erythema. No pallor. [place]: Alert and oriented to person, place, and time. Normal reflexes, muscle tone coordination. No cranial nerve deficit noted. PSYCHIATRIC:  Normal mood and affect. Normal behavior. Normal judgment and thought content. CARDIOVASCULAR: Normal heart rate noted, regular rhythm RESPIRATORY: Effort and breath sounds normal, no problems with respiration noted ABDOMEN: Soft, nontender, nondistended. PELVIC: Deferred MUSCULOSKELETAL: Normal range of motion. No edema and no tenderness. 2+ distal pulses.  Labs: Results for orders placed or performed in visit on 12/01/19 (from the past 336 hour(s))  Surgical pathology   Collection Time: 12/01/19  2:19 PM  Result Value Ref Range   SURGICAL PATHOLOGY      SURGICAL PATHOLOGY CASE: [id number] PATIENT: Sabrina Holder Surgical Pathology Report     Clinical History: menorrhagia with regular cycle, anemia due to chronic blood loss (cm)   FINAL MICROSCOPIC DIAGNOSIS:  A. 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  The specimen is entirely submitted in 2 cassettes. Craig Staggers 12/03/2019)    Final Diagnosis performed by Thressa Sheller, MD.   Electronically signed 12/03/2019 Technical and / or Professional components performed at [facility]. [facility], [place] [address], [place], [place] [address].  Immunohistochemistry Technical component (if applicable) was performed at [facility]. [address] [place], [place], [place] [address].   IMMUNOHISTOCHEMISTRY DISCLAIMER (if applicab le): Some of these immunohistochemical  stains may have been developed and the performance characteristics determine by [facility]. Some may not have been cleared or approved by the U.S. Food and Drug Administration. The FDA has determined that such clearance or approval is not necessary. This test is used for clinical purposes. It should not be regarded as investigational or for research. This laboratory is certified under the [employer] (CLIA-88) as qualified to perform high complexity clinical laboratory testing.  The controls stained appropriately.     Imaging Studies: US PELVIC COMPLETE WITH TRANSVAGINAL  Result Date: 12/01/2019 Patient Name: Sabrina Holder DOB: February 14, 1981 MRN: [id number] ULTRASOUND REPORT Location: [facility] Date of Service: 12/01/2019 Indications:Abnormal Uterine Bleeding Findings: The uterus is anteverted and measures 10.0 x 6.2 x 5.0 cm. Echo texture is heterogeneous without evidence of focal masses. The Endometrium measures 3.7 mm, questionably. There is echogenic material and blood within the endometrium canal. Endometrial polyps can not be ruled out. The echogenic portion is within the entire endometrial canal. It measures between 6.7 mm and 10.1 mm at the C-section scar. Right Ovary measures 3.2 x 2.8 x 2.2  cm. It is normal in appearance. Left Ovary measures 4.0 x 2.5 x 3.3  cm. It is normal in appearance. There is a simple cyst in the left adnexa measuring 17.3 x 12.4 x 12.1 mm. Survey of the adnexa demonstrates no adnexal masses. There is no free fluid in the cul de sac. Impression: 1. Endometrial polyps can not be ruled out. There is blood within the endometrial canal as well. 2. The uterus is heterogeneous. No fibroids are seen. 3. Normal appearing ovaries. 4. There is a small simple cyst in the left adnexa. Gweneth Dimitri, RT The ultrasound images and findings were reviewed by me and I agree with the above report. Prentice Docker, MD, [facility], [facility] 12/01/2019 2:07 PM      Assessment:   ICD-10-CM   1. Menorrhagia with regular cycle  N92.0   2. Anemia due to chronic  blood loss  D50.0      Plan: Patient will undergo surgical management with the above noted surgey.   The risks of surgery were discussed in detail with the patient including but not limited to: bleeding which may require transfusion or reoperation; infection which may require antibiotics; injury to surrounding organs which may involve bowel, bladder, ureters ; need for additional procedures including laparoscopy or laparotomy; thromboembolic phenomenon, surgical site problems and other postoperative/anesthesia complications. Likelihood of success in alleviating the patient's condition was discussed. Routine postoperative instructions will be reviewed with the patient and her family in detail after surgery.  The patient concurred with the proposed plan, giving informed written consent for the surgery.  Preoperative prophylactic antibiotics, as indicated, and SCDs ordered on call to the OR.    Prentice Docker, MD 12/08/2019 4:33 PM

## 2019-12-09 ENCOUNTER — Encounter
Admission: RE | Admit: 2019-12-09 | Discharge: 2019-12-09 | Disposition: A | Discharge status: Home / Self Care | Payer: Medicaid Other | Source: Ambulatory Visit | Attending: Obstetrics and Gynecology | Admitting: Obstetrics and Gynecology

## 2019-12-09 HISTORY — DX: Depression, unspecified: F32.A

## 2019-12-09 HISTORY — DX: Anemia, unspecified: D64.9

## 2019-12-09 HISTORY — DX: Gastro-esophageal reflux disease without esophagitis: K21.9

## 2019-12-09 NOTE — Patient Instructions (Signed)
Your procedure is scheduled on: 12/15/19 Report to [place]. To find out your arrival time please call [phone] between 1PM - 3PM on 12/14/19.  Remember: Instructions that are not followed completely may result in serious medical risk, up to and including death, or upon the discretion of your surgeon and anesthesiologist your surgery may need to be rescheduled.     _X__ 1. Do not eat food after midnight the night before your procedure.                 No gum chewing or hard candies. You may drink clear liquids up to 2 hours                 before you are scheduled to arrive for your surgery- DO not drink clear                 liquids within 2 hours of the start of your surgery.                 Clear Liquids include:  water, apple juice without pulp, clear carbohydrate                 drink such as Clearfast or Gatorade, Black Coffee or Tea (Do not add                 anything to coffee or tea). Diabetics water only  FINISH THE ENSURE "CLEAR" PRE SURGERY DRINK 2 HOURS BEFORE ARRIVING TO SURGERY  __X__2.  On the morning of surgery brush your teeth with toothpaste and water, you                 may rinse your mouth with mouthwash if you wish.  Do not swallow any              toothpaste of mouthwash.     _X__ 3.  No Alcohol for 24 hours before or after surgery.   _X__ 4.  Do Not Smoke or use e-cigarettes For 24 Hours Prior to Your Surgery.                 Do not use any chewable tobacco products for at least 6 hours prior to                 surgery.  ____  5.  Bring all medications with you on the day of surgery if instructed.   __X__  6.  Notify your doctor if there is any change in your medical condition      (cold, fever, infections).     Do not wear jewelry, make-up, hairpins, clips or nail polish. Do not wear lotions, powders, or perfumes.  Do not shave 48 hours prior to surgery. Men may shave face and neck. Do not bring  valuables to the hospital.    [facility] is not responsible for any belongings or valuables.  Contacts, dentures/partials or body piercings may not be worn into surgery. Bring a case for your contacts, glasses or hearing aids, a denture cup will be supplied. Leave your suitcase in the car. After surgery it may be brought to your room. For patients admitted to the hospital, discharge time is determined by your treatment team.   Patients discharged the day of surgery will not be allowed to drive home.   Please read over the following fact sheets that you were given:   MRSA Information  __X__  Take these medicines the morning of surgery with A SIP OF WATER:    1. buPROPion (WELLBUTRIN XL) 300 MG 24 hr tablet  2. escitalopram (LEXAPRO) 20 MG tablet  3. fluticasone (FLONASE) 50 MCG/ACT nasal spray  4. omeprazole (PRILOSEC) 20 MG capsule  5.  6.  ____ Fleet Enema (as directed)   __X__ Use CHG Soap/SAGE wipes as directed  __X__ Use inhalers on the day of surgery  DO A NEBULIZER TREATMENT ALSO  ____ Stop metformin/Janumet/Farxiga 2 days prior to surgery    ____ Take 1/2 of usual insulin dose the night before surgery. No insulin the morning          of surgery.   ____ Stop Blood Thinners Coumadin/Plavix/Xarelto/Pleta/Pradaxa/Eliquis/Effient/Aspirin  on   Or contact your Surgeon, Cardiologist or Medical Doctor regarding  ability to stop your blood thinners  __X__ Stop Anti-inflammatories 7 days before surgery such as Advil, Ibuprofen, Motrin,  BC or Goodies Powder, Naprosyn, Naproxen, Aleve, Aspirin    __X__ Stop all herbal supplements, fish oil or vitamin E until after surgery.    ____ Bring C-Pap to the hospital.       How to Use Chlorhexidine for Bathing Chlorhexidine gluconate (CHG) is a germ-killing (antiseptic) solution that is used to clean the skin. It can get rid of the bacteria that normally live on the skin and can keep them away for about 24 hours. To clean your skin  with CHG, you may be given:  A CHG solution to use in the shower or as part of a sponge bath.  A prepackaged cloth that contains CHG. Cleaning your skin with CHG may help lower the risk for infection:  While you are staying in the intensive care unit of the hospital.  If you have a vascular access, such as a central line, to provide short-term or long-term access to your veins.  If you have a catheter to drain urine from your bladder.  If you are on a ventilator. A ventilator is a machine that helps you breathe by moving air in and out of your lungs.  After surgery. What are the risks? Risks of using CHG include:  A skin reaction.  Hearing loss, if CHG gets in your ears.  Eye injury, if CHG gets in your eyes and is not rinsed out.  The CHG product catching fire. Make sure that you avoid smoking and flames after applying CHG to your skin. Do not use CHG:  If you have a chlorhexidine allergy or have previously reacted to chlorhexidine.  On babies younger than 6 months of age. How to use CHG solution  Use CHG only as told by your health care provider, and follow the instructions on the label.  Use the full amount of CHG as directed. Usually, this is one bottle. During a shower Follow these steps when using CHG solution during a shower (unless your health care provider gives you different instructions): 1. Start the shower. 2. Use your normal soap and shampoo to wash your face and hair. 3. Turn off the shower or move out of the shower stream. 4. Pour the CHG onto a clean washcloth. Do not use any type of brush or rough-edged sponge. 5. Starting at your neck, lather your body down to your toes. Make sure you follow these instructions: ? If you will be having surgery, pay special attention to the part of your body where you will be having surgery. Scrub this area for at least 1 minute. ? Do not  use CHG on your head or face. If the solution gets into your ears or eyes, rinse  them well with water. ? Avoid your genital area. ? Avoid any areas of skin that have broken skin, cuts, or scrapes. ? Scrub your back and under your arms. Make sure to wash skin folds. 6. Let the lather sit on your skin for 1-2 minutes or as long as told by your health care provider. 7. Thoroughly rinse your entire body in the shower. Make sure that all body creases and crevices are rinsed well. 8. Dry off with a clean towel. Do not put any substances on your body afterward--such as powder, lotion, or perfume--unless you are told to do so by your health care provider. Only use lotions that are recommended by the manufacturer. 9. Put on clean clothes or pajamas. 10. If it is the night before your surgery, sleep in clean sheets. 11. REPEAT THE MORNING OF SURGERY (USE 1/2 BOTTLE FOR EACH SHOWER)

## 2019-12-11 ENCOUNTER — Other Ambulatory Visit: Payer: Self-pay

## 2019-12-11 ENCOUNTER — Other Ambulatory Visit
Admission: RE | Admit: 2019-12-11 | Discharge: 2019-12-11 | Disposition: A | Discharge status: Home / Self Care | Payer: Medicaid Other | Source: Ambulatory Visit | Attending: Obstetrics and Gynecology | Admitting: Obstetrics and Gynecology

## 2019-12-11 DIAGNOSIS — Z01812 Encounter for preprocedural laboratory examination: Secondary | ICD-10-CM | POA: Insufficient documentation

## 2019-12-11 DIAGNOSIS — Z20822 Contact with and (suspected) exposure to covid-19: Secondary | ICD-10-CM | POA: Diagnosis not present

## 2019-12-11 LAB — SARS CORONAVIRUS 2 (TAT 6-24 HRS): SARS Coronavirus 2: NEGATIVE

## 2019-12-11 LAB — COMPREHENSIVE METABOLIC PANEL
ALT: 20 U/L (ref 0–44)
AST: 19 U/L (ref 15–41)
Albumin: 3.8 g/dL (ref 3.5–5.0)
Alkaline Phosphatase: 97 U/L (ref 38–126)
Anion gap: 9 (ref 5–15)
BUN: 10 mg/dL (ref 6–20)
CO2: 28 mmol/L (ref 22–32)
Calcium: 8.7 mg/dL — ABNORMAL LOW (ref 8.9–10.3)
Chloride: 101 mmol/L (ref 98–111)
Creatinine, Ser: 0.72 mg/dL (ref 0.44–1.00)
GFR calc Af Amer: >60 mL/min (ref >60)
GFR calc non Af Amer: >60 mL/min (ref >60)
Glucose, Bld: 89 mg/dL (ref 70–99)
Potassium: 3.6 mmol/L (ref 3.5–5.1)
Sodium: 138 mmol/L (ref 135–145)
Total Bilirubin: 0.9 mg/dL (ref 0.3–1.2)
Total Protein: 6.8 g/dL (ref 6.5–8.1)

## 2019-12-11 LAB — CBC
HCT: 31.1 % — ABNORMAL LOW (ref 36.0–46.0)
Hemoglobin: 10.1 g/dL — ABNORMAL LOW (ref 12.0–15.0)
MCH: 24.5 pg — ABNORMAL LOW (ref 26.0–34.0)
MCHC: 32.5 g/dL (ref 30.0–36.0)
MCV: 75.5 fL — ABNORMAL LOW (ref 80.0–100.0)
Platelets: 597 10*3/uL — ABNORMAL HIGH (ref 150–400)
RBC: 4.12 MIL/uL (ref 3.87–5.11)
RDW: 20.2 % — ABNORMAL HIGH (ref 11.5–15.5)
WBC: 7.5 10*3/uL (ref 4.0–10.5)
nRBC: 0 % (ref 0.0–0.2)

## 2019-12-15 ENCOUNTER — Ambulatory Visit: Payer: Medicaid Other | Admitting: Certified Registered"

## 2019-12-15 ENCOUNTER — Ambulatory Visit
Admission: RE | Admit: 2019-12-15 | Discharge: 2019-12-15 | Disposition: A | Discharge status: Home / Self Care | Payer: Medicaid Other | Attending: Obstetrics and Gynecology | Admitting: Obstetrics and Gynecology

## 2019-12-15 ENCOUNTER — Encounter
Admission: RE | Disposition: A | Discharge status: Home / Self Care | Payer: Self-pay | Source: Home / Self Care | Attending: Obstetrics and Gynecology

## 2019-12-15 ENCOUNTER — Encounter: Payer: Self-pay | Admitting: Obstetrics and Gynecology

## 2019-12-15 ENCOUNTER — Other Ambulatory Visit: Payer: Self-pay

## 2019-12-15 DIAGNOSIS — K219 Gastro-esophageal reflux disease without esophagitis: Secondary | ICD-10-CM | POA: Insufficient documentation

## 2019-12-15 DIAGNOSIS — F17210 Nicotine dependence, cigarettes, uncomplicated: Secondary | ICD-10-CM | POA: Diagnosis not present

## 2019-12-15 DIAGNOSIS — N800 Endometriosis of uterus: Secondary | ICD-10-CM | POA: Diagnosis not present

## 2019-12-15 DIAGNOSIS — J45909 Unspecified asthma, uncomplicated: Secondary | ICD-10-CM | POA: Insufficient documentation

## 2019-12-15 DIAGNOSIS — N840 Polyp of corpus uteri: Secondary | ICD-10-CM | POA: Diagnosis not present

## 2019-12-15 DIAGNOSIS — Z79899 Other long term (current) drug therapy: Secondary | ICD-10-CM | POA: Insufficient documentation

## 2019-12-15 DIAGNOSIS — N838 Other noninflammatory disorders of ovary, fallopian tube and broad ligament: Secondary | ICD-10-CM | POA: Diagnosis not present

## 2019-12-15 DIAGNOSIS — F419 Anxiety disorder, unspecified: Secondary | ICD-10-CM | POA: Diagnosis not present

## 2019-12-15 DIAGNOSIS — D259 Leiomyoma of uterus, unspecified: Secondary | ICD-10-CM | POA: Insufficient documentation

## 2019-12-15 DIAGNOSIS — N920 Excessive and frequent menstruation with regular cycle: Secondary | ICD-10-CM | POA: Diagnosis present

## 2019-12-15 DIAGNOSIS — Z7951 Long term (current) use of inhaled steroids: Secondary | ICD-10-CM | POA: Diagnosis not present

## 2019-12-15 DIAGNOSIS — D500 Iron deficiency anemia secondary to blood loss (chronic): Secondary | ICD-10-CM | POA: Diagnosis not present

## 2019-12-15 HISTORY — PX: CYSTOSCOPY: SHX5120

## 2019-12-15 HISTORY — PX: TOTAL LAPAROSCOPIC HYSTERECTOMY WITH SALPINGECTOMY: SHX6742

## 2019-12-15 LAB — ABO/RH: ABO/RH(D): O POS

## 2019-12-15 LAB — TYPE AND SCREEN
ABO/RH(D): O POS
Antibody Screen: NEGATIVE

## 2019-12-15 LAB — POCT PREGNANCY, URINE: Preg Test, Ur: NEGATIVE

## 2019-12-15 SURGERY — HYSTERECTOMY, TOTAL, LAPAROSCOPIC, WITH SALPINGECTOMY
Anesthesia: General

## 2019-12-15 MED ORDER — CEFAZOLIN SODIUM-DEXTROSE 2-4 GM/100ML-% IV SOLN
INTRAVENOUS | Status: AC
  Filled 2019-12-15: qty 100

## 2019-12-15 MED ORDER — MIDAZOLAM HCL 2 MG/2ML IJ SOLN
INTRAMUSCULAR | Status: AC
  Filled 2019-12-15: qty 2

## 2019-12-15 MED ORDER — OXYCODONE HCL 5 MG PO TABS
ORAL_TABLET | ORAL | Status: DC
Start: 2019-12-15 — End: 2019-12-15
  Filled 2019-12-15: qty 1

## 2019-12-15 MED ORDER — LIDOCAINE HCL (CARDIAC) PF 100 MG/5ML IV SOSY
PREFILLED_SYRINGE | INTRAVENOUS | Status: DC | PRN
  Administered 2019-12-15: 80 mg via INTRAVENOUS

## 2019-12-15 MED ORDER — PHENYLEPHRINE HCL (PRESSORS) 10 MG/ML IV SOLN
INTRAVENOUS | Status: DC | PRN
  Administered 2019-12-15: 200 ug via INTRAVENOUS
  Administered 2019-12-15: 100 ug via INTRAVENOUS

## 2019-12-15 MED ORDER — SUGAMMADEX SODIUM 500 MG/5ML IV SOLN
INTRAVENOUS | Status: DC | PRN
  Administered 2019-12-15: 200 mg via INTRAVENOUS

## 2019-12-15 MED ORDER — BUPIVACAINE LIPOSOME 1.3 % IJ SUSP
INTRAMUSCULAR | Status: AC
  Filled 2019-12-15: qty 20

## 2019-12-15 MED ORDER — DEXMEDETOMIDINE HCL 200 MCG/2ML IV SOLN
INTRAVENOUS | Status: DC | PRN
  Administered 2019-12-15: 12 ug via INTRAVENOUS
  Administered 2019-12-15: 8 ug via INTRAVENOUS

## 2019-12-15 MED ORDER — PROMETHAZINE HCL 25 MG/ML IJ SOLN: 6.2500 mg | INTRAMUSCULAR | Status: DC | PRN

## 2019-12-15 MED ORDER — MIDAZOLAM HCL 2 MG/2ML IJ SOLN
INTRAMUSCULAR | Status: DC | PRN
  Administered 2019-12-15: 2 mg via INTRAVENOUS

## 2019-12-15 MED ORDER — POVIDONE-IODINE 10 % EX SWAB
2.0000 "application " | Freq: Once | CUTANEOUS | Status: AC
  Administered 2019-12-15: 2 via TOPICAL

## 2019-12-15 MED ORDER — MONTELUKAST SODIUM 10 MG PO TABS: 10.0000 mg | ORAL_TABLET | Freq: Every day | ORAL | Status: DC

## 2019-12-15 MED ORDER — CHLORHEXIDINE GLUCONATE 0.12 % MT SOLN
OROMUCOSAL | Status: AC
  Administered 2019-12-15: 15 mL via OROMUCOSAL
  Filled 2019-12-15: qty 15

## 2019-12-15 MED ORDER — ESMOLOL HCL 100 MG/10ML IV SOLN
INTRAVENOUS | Status: DC | PRN
  Administered 2019-12-15: 30 mg via INTRAVENOUS

## 2019-12-15 MED ORDER — OXYCODONE HCL 5 MG PO TABS
5.0000 mg | ORAL_TABLET | Freq: Once | ORAL | Status: AC | PRN
  Administered 2019-12-15: 5 mg via ORAL

## 2019-12-15 MED ORDER — OXYCODONE HCL 5 MG/5ML PO SOLN: 5.0000 mg | Freq: Once | ORAL | Status: AC | PRN

## 2019-12-15 MED ORDER — IBUPROFEN 600 MG PO TABS: 600.0000 mg | ORAL_TABLET | Freq: Four times a day (QID) | ORAL | 0 refills | Status: DC

## 2019-12-15 MED ORDER — OXYCODONE-ACETAMINOPHEN 5-325 MG PO TABS
ORAL_TABLET | ORAL | Status: AC
  Filled 2019-12-15: qty 1

## 2019-12-15 MED ORDER — FENTANYL CITRATE (PF) 100 MCG/2ML IJ SOLN
INTRAMUSCULAR | Status: AC
  Filled 2019-12-15: qty 2

## 2019-12-15 MED ORDER — ONDANSETRON HCL 4 MG/2ML IJ SOLN
INTRAMUSCULAR | Status: DC | PRN
  Administered 2019-12-15: 4 mg via INTRAVENOUS

## 2019-12-15 MED ORDER — ORAL CARE MOUTH RINSE: 15.0000 mL | Freq: Once | OROMUCOSAL | Status: AC

## 2019-12-15 MED ORDER — MEPERIDINE HCL 50 MG/ML IJ SOLN: 6.2500 mg | INTRAMUSCULAR | Status: DC | PRN

## 2019-12-15 MED ORDER — PROPOFOL 10 MG/ML IV BOLUS
INTRAVENOUS | Status: DC | PRN
  Administered 2019-12-15: 160 mg via INTRAVENOUS

## 2019-12-15 MED ORDER — ONDANSETRON 4 MG PO TBDP: 4.0000 mg | ORAL_TABLET | Freq: Four times a day (QID) | ORAL | 0 refills | Status: DC | PRN

## 2019-12-15 MED ORDER — LACTATED RINGERS IV SOLN: INTRAVENOUS | Status: DC

## 2019-12-15 MED ORDER — DEXAMETHASONE SODIUM PHOSPHATE 10 MG/ML IJ SOLN
INTRAMUSCULAR | Status: DC | PRN
  Administered 2019-12-15: 10 mg via INTRAVENOUS

## 2019-12-15 MED ORDER — FENTANYL CITRATE (PF) 100 MCG/2ML IJ SOLN
INTRAMUSCULAR | Status: DC | PRN
Start: 2019-12-15 — End: 2019-12-15
  Administered 2019-12-15: 50 ug via INTRAVENOUS
  Administered 2019-12-15: 25 ug via INTRAVENOUS
  Administered 2019-12-15: 50 ug via INTRAVENOUS
  Administered 2019-12-15: 75 ug via INTRAVENOUS

## 2019-12-15 MED ORDER — KETAMINE HCL 50 MG/ML IJ SOLN
INTRAMUSCULAR | Status: DC | PRN
  Administered 2019-12-15: 30 mg via INTRAMUSCULAR

## 2019-12-15 MED ORDER — FENTANYL CITRATE (PF) 100 MCG/2ML IJ SOLN
INTRAMUSCULAR | Status: AC
Start: 2019-12-15 — End: 2019-12-15
  Administered 2019-12-15: 25 ug via INTRAVENOUS
  Filled 2019-12-15: qty 2

## 2019-12-15 MED ORDER — OXYCODONE-ACETAMINOPHEN 5-325 MG PO TABS: 1.0000 | ORAL_TABLET | Freq: Four times a day (QID) | ORAL | 0 refills | Status: DC | PRN

## 2019-12-15 MED ORDER — BUPIVACAINE LIPOSOME 1.3 % IJ SUSP
INTRAMUSCULAR | Status: DC | PRN
  Administered 2019-12-15: 14 mL

## 2019-12-15 MED ORDER — ROCURONIUM BROMIDE 100 MG/10ML IV SOLN
INTRAVENOUS | Status: DC | PRN
  Administered 2019-12-15: 20 mg via INTRAVENOUS
  Administered 2019-12-15: 10 mg via INTRAVENOUS
  Administered 2019-12-15: 50 mg via INTRAVENOUS

## 2019-12-15 MED ORDER — GLYCOPYRROLATE 0.2 MG/ML IJ SOLN
INTRAMUSCULAR | Status: DC | PRN
  Administered 2019-12-15: .2 mg via INTRAVENOUS

## 2019-12-15 MED ORDER — FENTANYL CITRATE (PF) 100 MCG/2ML IJ SOLN
25.0000 ug | INTRAMUSCULAR | Status: DC | PRN
  Administered 2019-12-15: 25 ug via INTRAVENOUS

## 2019-12-15 MED ORDER — OXYCODONE-ACETAMINOPHEN 5-325 MG PO TABS
1.0000 | ORAL_TABLET | Freq: Four times a day (QID) | ORAL | Status: DC | PRN
  Administered 2019-12-15: 1 via ORAL

## 2019-12-15 MED ORDER — IPRATROPIUM-ALBUTEROL 0.5-2.5 (3) MG/3ML IN SOLN: 3.0000 mL | Freq: Four times a day (QID) | RESPIRATORY_TRACT | Status: DC | PRN

## 2019-12-15 MED ORDER — CEFAZOLIN SODIUM-DEXTROSE 2-4 GM/100ML-% IV SOLN
2.0000 g | INTRAVENOUS | Status: AC
  Administered 2019-12-15: 2 g via INTRAVENOUS

## 2019-12-15 MED ORDER — CHLORHEXIDINE GLUCONATE 0.12 % MT SOLN: 15.0000 mL | Freq: Once | OROMUCOSAL | Status: AC

## 2019-12-15 SURGICAL SUPPLY — 61 items
ADH SKN CLS APL DERMABOND .7 (GAUZE/BANDAGES/DRESSINGS) ×2
APL PRP STRL LF DISP 70% ISPRP (MISCELLANEOUS) ×2
BAG DRN RND TRDRP ANRFLXCHMBR (UROLOGICAL SUPPLIES) ×2
BAG URINE DRAIN 2000ML AR STRL (UROLOGICAL SUPPLIES) ×4 IMPLANT
BLADE SURG SZ11 CARB STEEL (BLADE) ×4 IMPLANT
CATH FOLEY 2WAY  5CC 16FR (CATHETERS) ×2
CATH FOLEY 2WAY 5CC 16FR (CATHETERS) ×2
CATH URTH 16FR FL 2W BLN LF (CATHETERS) ×2 IMPLANT
CHLORAPREP W/TINT 26 (MISCELLANEOUS) ×4 IMPLANT
COVER WAND RF STERILE (DRAPES) ×4 IMPLANT
DEFOGGER SCOPE WARMER CLEARIFY (MISCELLANEOUS) ×4 IMPLANT
DERMABOND ADVANCED (GAUZE/BANDAGES/DRESSINGS) ×2
DERMABOND ADVANCED .7 DNX12 (GAUZE/BANDAGES/DRESSINGS) ×2 IMPLANT
DEVICE SUTURE ENDOST 10MM (ENDOMECHANICALS) IMPLANT
DRAPE 3/4 80X56 (DRAPES) ×4 IMPLANT
DRAPE LEGGINS SURG 28X43 STRL (DRAPES) ×4 IMPLANT
DRAPE UNDER BUTTOCK W/FLU (DRAPES) ×4 IMPLANT
DRSG TELFA 3X8 NADH (GAUZE/BANDAGES/DRESSINGS) ×4 IMPLANT
GAUZE 4X4 16PLY RFD (DISPOSABLE) ×4 IMPLANT
GLOVE BIO SURGEON STRL SZ7 (GLOVE) ×24 IMPLANT
GLOVE BIOGEL PI IND STRL 7.5 (GLOVE) ×2 IMPLANT
GLOVE BIOGEL PI INDICATOR 7.5 (GLOVE) ×2
GLOVE INDICATOR 7.5 STRL GRN (GLOVE) ×8 IMPLANT
GOWN STRL REUS W/ TWL LRG LVL3 (GOWN DISPOSABLE) ×12 IMPLANT
GOWN STRL REUS W/ TWL XL LVL3 (GOWN DISPOSABLE) ×2 IMPLANT
GOWN STRL REUS W/TWL LRG LVL3 (GOWN DISPOSABLE) ×24
GOWN STRL REUS W/TWL XL LVL3 (GOWN DISPOSABLE) ×4
GRASPER SUT TROCAR 14GX15 (MISCELLANEOUS) ×4 IMPLANT
IRRIGATION STRYKERFLOW (MISCELLANEOUS) IMPLANT
IRRIGATOR STRYKERFLOW (MISCELLANEOUS)
IV LACTATED RINGERS 1000ML (IV SOLUTION) ×8 IMPLANT
KIT PINK PAD W/HEAD ARE REST (MISCELLANEOUS) ×4
KIT PINK PAD W/HEAD ARM REST (MISCELLANEOUS) ×2 IMPLANT
KIT TURNOVER CYSTO (KITS) ×4 IMPLANT
LABEL OR SOLS (LABEL) ×4 IMPLANT
LIGASURE VESSEL 5MM BLUNT TIP (ELECTROSURGICAL) IMPLANT
MANIPULATOR VCARE LG CRV RETR (MISCELLANEOUS) IMPLANT
MANIPULATOR VCARE SML CRV RETR (MISCELLANEOUS) IMPLANT
MANIPULATOR VCARE STD CRV RETR (MISCELLANEOUS) IMPLANT
NEEDLE HYPO 22GX1.5 SAFETY (NEEDLE) ×4 IMPLANT
OCCLUDER COLPOPNEUMO (BALLOONS) ×4 IMPLANT
PACK LAP CHOLECYSTECTOMY (MISCELLANEOUS) ×4 IMPLANT
PAD DRESSING TELFA 3X8 NADH (GAUZE/BANDAGES/DRESSINGS) IMPLANT
PAD OB MATERNITY 4.3X12.25 (PERSONAL CARE ITEMS) ×4 IMPLANT
PAD PREP 24X41 OB/GYN DISP (PERSONAL CARE ITEMS) ×4 IMPLANT
SCISSORS METZENBAUM CVD 33 (INSTRUMENTS) ×4 IMPLANT
SET CYSTO W/LG BORE CLAMP LF (SET/KITS/TRAYS/PACK) ×4 IMPLANT
SET TRI-LUMEN FLTR TB AIRSEAL (TUBING) ×4 IMPLANT
SLEEVE ENDOPATH XCEL 5M (ENDOMECHANICALS) ×4 IMPLANT
SOL PREP PVP 2OZ (MISCELLANEOUS) ×4
SOLUTION PREP PVP 2OZ (MISCELLANEOUS) ×2 IMPLANT
SURGILUBE 2OZ TUBE FLIPTOP (MISCELLANEOUS) ×4 IMPLANT
SUT ENDO VLOC 180-0-8IN (SUTURE) ×4 IMPLANT
SUT MNCRL 4-0 (SUTURE) ×4
SUT MNCRL 4-0 27XMFL (SUTURE) ×2
SUT VIC AB 0 CT1 36 (SUTURE) ×4 IMPLANT
SUTURE MNCRL 4-0 27XMF (SUTURE) ×2 IMPLANT
SYR 10ML LL (SYRINGE) ×8 IMPLANT
SYR 50ML LL SCALE MARK (SYRINGE) ×4 IMPLANT
TROCAR PORT AIRSEAL 8X100 (TROCAR) ×4 IMPLANT
TROCAR XCEL NON-BLD 5MMX100MML (ENDOMECHANICALS) ×4 IMPLANT

## 2019-12-15 NOTE — Discharge Instructions (Signed)

## 2019-12-15 NOTE — Anesthesia Procedure Notes (Signed)
Procedure Name: Intubation Performed by: Dashana Guizar L, CRNA Pre-anesthesia Checklist: Patient identified, Patient being monitored, Timeout performed, Emergency Drugs available and Suction available Patient Re-evaluated:Patient Re-evaluated prior to induction Oxygen Delivery Method: Circle system utilized Preoxygenation: Pre-oxygenation with 100% oxygen Induction Type: IV induction Ventilation: Mask ventilation without difficulty Laryngoscope Size: 3 and McGraph Grade View: Grade I Tube type: Oral Tube size: 7.0 mm Number of attempts: 1 Airway Equipment and Method: Stylet Placement Confirmation: ETT inserted through vocal cords under direct vision,  positive ETCO2 and breath sounds checked- equal and bilateral Secured at: 21 cm Tube secured with: Tape Dental Injury: Teeth and Oropharynx as per pre-operative assessment        

## 2019-12-15 NOTE — Op Note (Signed)
Operative Note    Name: Sabrina Holder  Date of Service: 12/15/2019  DOB: Feb 10, 1981  MRN: [id number]   Pre-Operative Diagnosis:  1) Menorrhagia with regular cycle [N92.0] 2) Anemia due to chronic blood loss [D50.0]  Post-Operative Diagnosis:  1) Menorrhagia with regular cycle [N92.0] 2) Anemia due to chronic blood loss [D50.0]  Procedures:  1. Total Laparoscopic Hysterectomy, bilateral salpingectomy  2. Cystoscopy  Primary Surgeon: Prentice Docker, MD   Assistant Surgeon: Humberto Leep, MD - No other capable assistant available, in surgery requiring high level assistant.  Anesthesia: general endotracheal and Exparel for local  EBL: 125 mL   IVF: 1,500 mL   Urine output: 1,000 mL  Specimens: uterus with cervix, bilateral fallopian tubes  Drains: none  Complications: None   Disposition: PACU   Condition: Stable   Findings:  1) Globally enlarged uterus 2) Bilateral fallopian tubes with multiple paratubal cysts with changes consistent with bilateral tubal ligation 3) Bladder adherent to lower uterine segment 4) adhesions of omentum to anterior abdominal wall 5) on cystoscopy, bladder without any apparent wall defects and efflux of urine from the bilateral ureteral orifices  Procedure Summary:  The patient was taken to the operating room where general anesthesia was administered and found to be adequate. She was placed in the dorsal supine lithotomy position in [place] stirrups and prepped and draped in usual sterile fashion. After a timeout was called, an indwelling catheter was placed in her bladder. A sterile speculum was placed in the vagina and a single-tooth tenaculum was used to grasp the anterior lip of the cervix. An V-Care uterine manipulator was affixed to the uterus in accordance to the manufacturers recommendations. The speculum and tenaculum was removed from the vagina.  Attention was turned to the abdomen where, after injection of local anesthetic,  a 5 mm infraumbilical incision was made with the scalpel. Entry into the abdomen was obtained via Optiview trocar technique (a blunt entry technique with camera visualization through the obturator upon entry). Verification of entry into the abdomen was obtained using opening pressures. The abdomen was insufflated with CO2. The camera was introduced through the trocar with verification of atraumatic entry. Left and right lower quadrant 5 mm ports were created via direct intra-abdominal camera visualization without difficulty.  The omentum was freed from the anterior abdominal wall at the area of prior c-section. An 8 mm AirSeal suprapubic port was placed in a similar fashion without difficulty.  After inspection of the abdomen and pelvis with the above-noted findings, the bilateral ureters were identified and found to be well away from the operative area of interest. The left fallopian tube was grasped at the fimbriated end and was transected using the LigaSure along the mesosalpinx in a lateral to medial fashion. The LigaSure then was used to transect the left round ligament and the utero-ovarian ligament was transected. Tissue was divided along the left broad ligament to the level of the interior cervical os. Adhesions from the anterior uterus were taken down with great care and the lower uterine segment was identified. Scar tissue and bladder tissue were dissected off the lower uterine segment and cervix without difficulty. The left uterine artery was skeletonized and identified and after ligation was transected with the LigaSure device. The same procedure was carried out on the right side. The colpotomy was performed using monopolar electrocautery in a circumferential fashion following the KOH ring.  The uterus and fallopian tubes and cervix were removed through the vagina.  Closure of the vaginal cuff  was undertaken using the V-lock stitch in a running fashion. All vascular pedicles were inspected and found  to be hemostatic. Copious irrigation was undertaken and hemostasis was again verified. Pressure was dropped in the abdomen to 5 mmHg to verify ongoing hemostasis.  The suprapubic trocar was removed and the fascia was reapproximated using #0 Vicryl with a single stitch. The abdomen was then desufflated of CO2 after removal of all instruments. The suprapubic skin incision was closed using 4-0 Vicryl in a subcuticular fashion. The remaining skin incisions were closed using surgical skin glue and a layer of surgical skin glue was placed over suprapubic skin incision, as well.   Cystoscopy was undertaken at this point. The Foley catheter was removed and the 70 cystoscope was gently introduced through the urethra. The bladder survey was undertaken with efflux of urine from both orifices noted. There were no defects noted in the bladder wall. The cystoscope was removed and the Foley catheter was replaced.  The surgical assistant performed creation of a trocar site, retracted tissue, performed ligation and transection of tissue along the right adnexa and performed part of the colpotomy.  The assistant also assisted with closure of the vaginal cuff with retraction of the suture.  He also assisted in the closure of the suprapubic port.    The patient tolerated the procedure well.  Sponge, lap, needle, and instrument counts were correct x 2.  VTE prophylaxis: SCDs. Antibiotic prophylaxis: Ancef 2 grams IV prior to the start of surgery. She was awakened in the operating room and was taken to the PACU in stable condition. The assistant surgeon was an MD due to lack of availability of another Counselling psychologist.   Prentice Docker, MD 12/15/2019 1:23 PM

## 2019-12-15 NOTE — Interval H&P Note (Signed)
History and Physical Interval Note:  12/15/2019 10:02 AM  Sabrina Holder  has presented today for surgery, with the diagnosis of Menorrhagia with regular cycle N92.0  Anemia due to chronic blood loss D50.0.  The various methods of treatment have been discussed with the patient and family. After consideration of risks, benefits and other options for treatment, the patient has consented to  Procedure(s): TOTAL LAPAROSCOPIC HYSTERECTOMY WITH SALPINGECTOMY (Bilateral) CYSTOSCOPY (N/A) as a surgical intervention.  The patient's history has been reviewed, patient examined, no change in status, stable for surgery.  I have reviewed the patient's chart and labs.  Questions were answered to the patient's satisfaction.    Prentice Docker, MD, [facility], [facility] 12/15/2019 10:02 AM

## 2019-12-15 NOTE — Anesthesia Postprocedure Evaluation (Signed)
Anesthesia Post Note  Patient: Estreya Clay  Procedure(s) Performed: TOTAL LAPAROSCOPIC HYSTERECTOMY WITH SALPINGECTOMY (Bilateral ) CYSTOSCOPY (N/A )  Patient location during evaluation: PACU Anesthesia Type: General Level of consciousness: awake and alert and oriented Pain management: pain level controlled Vital Signs Assessment: post-procedure vital signs reviewed and stable Respiratory status: spontaneous breathing, nonlabored ventilation and respiratory function stable Cardiovascular status: blood pressure returned to baseline and stable Postop Assessment: no signs of nausea or vomiting Anesthetic complications: no   No complications documented.   Last Vitals:  Vitals:   12/15/19 1349 12/15/19 1352  BP: (!) 145/91   Pulse: 91 93  Resp: 13 14  Temp:    SpO2: 96% 98%    Last Pain:  Vitals:   12/15/19 1359  TempSrc:   PainSc: 3                  Renu Asby

## 2019-12-15 NOTE — Transfer of Care (Signed)
Immediate Anesthesia Transfer of Care Note  Patient: Sabrina Holder  Procedure(s) Performed: TOTAL LAPAROSCOPIC HYSTERECTOMY WITH SALPINGECTOMY (Bilateral ) CYSTOSCOPY (N/A )  Patient Location: PACU  Anesthesia Type:General  Level of Consciousness: awake, alert  and oriented  Airway & Oxygen Therapy: Patient connected to face mask oxygen  Post-op Assessment: Report given to RN and Post -op Vital signs reviewed and stable  Post vital signs: Reviewed and stable  Last Vitals:  Vitals Value Taken Time  BP 139/88 12/15/19 1332  Temp    Pulse 103 12/15/19 1336  Resp 17 12/15/19 1336  SpO2 100 % 12/15/19 1336  Vitals shown include unvalidated device data.  Last Pain:  Vitals:   12/15/19 0919  TempSrc: Tympanic  PainSc: 0-No pain         Complications: No complications documented.

## 2019-12-15 NOTE — Anesthesia Preprocedure Evaluation (Signed)
Anesthesia Evaluation  Patient identified by MRN, date of birth, ID band Patient awake    Reviewed: Allergy & Precautions, NPO status , Patient's Chart, lab work & pertinent test results  History of Anesthesia Complications Negative for: history of anesthetic complications  Airway Mallampati: II  TM Distance: >3 FB Neck ROM: Full    Dental no notable dental hx.    Pulmonary asthma (mild intermittent) , Current Smoker and Patient abstained from smoking.,    breath sounds clear to auscultation- rhonchi (-) wheezing      Cardiovascular Exercise Tolerance: Good (-) hypertension(-) CAD, (-) Past MI, (-) Cardiac Stents and (-) CABG  Rhythm:Regular Rate:Normal - Systolic murmurs and - Diastolic murmurs    Neuro/Psych neg Seizures PSYCHIATRIC DISORDERS Anxiety Depression negative neurological ROS     GI/Hepatic Neg liver ROS, GERD  ,  Endo/Other  negative endocrine ROSneg diabetes  Renal/GU negative Renal ROS     Musculoskeletal negative musculoskeletal ROS (+)   Abdominal (+) + obese,   Peds  Hematology  (+) anemia ,   Anesthesia Other Findings Past Medical History: No date: Anemia No date: Anxiety No date: Asthma No date: Depression No date: Environmental allergies No date: GERD (gastroesophageal reflux disease) No date: Renal disorder   Reproductive/Obstetrics                             Anesthesia Physical Anesthesia Plan  ASA: II  Anesthesia Plan: General   Post-op Pain Management:    Induction: Intravenous  PONV Risk Score and Plan: 1 and Ondansetron, Dexamethasone and Midazolam  Airway Management Planned: Oral ETT  Additional Equipment:   Intra-op Plan:   Post-operative Plan: Extubation in OR  Informed Consent: I have reviewed the patients History and Physical, chart, labs and discussed the procedure including the risks, benefits and alternatives for the proposed  anesthesia with the patient or authorized representative who has indicated his/her understanding and acceptance.     Dental advisory given  Plan Discussed with: CRNA and Anesthesiologist  Anesthesia Plan Comments:         Anesthesia Quick Evaluation

## 2019-12-16 ENCOUNTER — Encounter: Payer: Self-pay | Admitting: Obstetrics and Gynecology

## 2019-12-17 LAB — SURGICAL PATHOLOGY

## 2019-12-25 ENCOUNTER — Encounter: Payer: Self-pay | Admitting: Obstetrics and Gynecology

## 2019-12-25 ENCOUNTER — Other Ambulatory Visit: Payer: Self-pay

## 2019-12-25 ENCOUNTER — Ambulatory Visit (INDEPENDENT_AMBULATORY_CARE_PROVIDER_SITE_OTHER): Payer: Medicaid Other | Admitting: Obstetrics and Gynecology

## 2019-12-25 VITALS — BP 122/84 | Ht 64.0 in | Wt 241.0 lb

## 2019-12-25 DIAGNOSIS — Z09 Encounter for follow-up examination after completed treatment for conditions other than malignant neoplasm: Secondary | ICD-10-CM

## 2019-12-25 NOTE — Progress Notes (Signed)
° °  Postoperative Follow-up Patient presents post op from Total Laparoscopic Hysterectomy, bilateral salpingectomy and cystoscopy 10 days ago for menorrhagia with regular cycle and anemia.  Subjective: Patient reports marked improvement in her preop symptoms. Eating a regular diet without difficulty. Her pain has improved greatly over the past week  Activity: increasing slowly.  She denies any current issues with her incisions.  Objective: Vitals:   12/25/19 0907  BP: 122/84   Vital Signs: BP 122/84    Ht 5\' 4"  (1.626 m)    Wt 241 lb (109.3 kg)    BMI 41.37 kg/m  Constitutional: Well nourished, well developed female in no acute distress.  HEENT: normal Skin: Warm and dry.  Extremity: no edema  Abdomen: Soft, non-tender, normal bowel sounds; no bruits, organomegaly or masses. The supraumbilical incision is slightly open without drainage. It is without erythema, induration, warmth, and  tenderness. The suprapbic incision is more open without drainage. It does not track deeper than ~3-4 mm. It is without erythema, induration, warmth, and tenderness. The other two lateral incisions are clean, dry, and intact without erythema, induration, warmth, and tenderness  Pelvic exam: deferred (no vaginal bleeding)  Assessment: 39 y.o. s/p the above surgery progressing well  Plan: Patient has done well after surgery with no apparent complications.  I have discussed the post-operative course to date, and the expected progress moving forward.  The patient understands what complications to be concerned about.  I will see the patient in routine follow up, or sooner if needed.    Activity plan: increase slowly.  Will allow the supraumbilical and suprapubic incisions to close by secondary intention. I will see her in 1 week to ensure no infection is setting in. None apparent now.  Wound care discussed.   Prentice Docker, MD 12/25/2019, 9:11 AM

## 2019-12-31 ENCOUNTER — Ambulatory Visit: Payer: Medicaid Other | Admitting: Obstetrics and Gynecology

## 2020-01-06 ENCOUNTER — Other Ambulatory Visit: Payer: Self-pay

## 2020-01-06 ENCOUNTER — Ambulatory Visit (INDEPENDENT_AMBULATORY_CARE_PROVIDER_SITE_OTHER): Payer: Medicaid Other | Admitting: Obstetrics and Gynecology

## 2020-01-06 ENCOUNTER — Encounter: Payer: Self-pay | Admitting: Obstetrics and Gynecology

## 2020-01-06 VITALS — BP 148/85 | HR 72 | Ht 64.0 in | Wt 238.0 lb

## 2020-01-06 DIAGNOSIS — Z09 Encounter for follow-up examination after completed treatment for conditions other than malignant neoplasm: Secondary | ICD-10-CM

## 2020-01-06 NOTE — Progress Notes (Signed)
   Postoperative Follow-up Patient presents post op from Total Laparoscopic Hysterectomy, bilateral salpingectomy and cystoscopy 3 weeks ago for menorrhagia with regular cycle and anemia.  Subjective: Patient reports marked improvement in her preop symptoms. Eating a regular diet without difficulty. Her pain has improved greatly over the past week  Activity: increasing slowly.  She denies any current issues with her incisions. She noted some vaginal bleeding today, bright red for a short while. Not a heavy volume. Much less now.  Denies fevers, chills, pelvic pressure. Denies trauma, intercourse, use of tampons, etc.  Objective: Vitals:   01/06/20 1512  BP: (!) 148/85  Pulse: 72   Vital Signs: BP (!) 148/85   Pulse 72   Ht 5\' 4"  (1.626 m)   Wt 238 lb (108 kg)   LMP 10/20/2019 (Approximate)   BMI 40.85 kg/m  Constitutional: Well nourished, well developed female in no acute distress.  HEENT: normal Skin: Warm and dry.  Extremity: no edema  Abdomen: Soft, non-tender, normal bowel sounds; no bruits, organomegaly or masses. The supraumbilical incision has nearly closed. It is without erythema, induration, warmth, and  tenderness. The suprapbic incision is slightly less open without drainage. It does not track deeper than ~3 mm. It is without erythema, induration, warmth, and tenderness. Granulation tissue noted.  The other two lateral incisions are clean, dry, and intact without erythema, induration, warmth, and tenderness  Pelvic exam: (female chaperone present) Scant blood in vaginal vault Vaginal cuff with slight disruption in midline (~1.5 cm), treated with silver nitrate. Otherwise, intact without tenderness and no apparent induration or erythema.   Assessment: 39 y.o. s/p the above surgery progressing well  Plan: Patient has done well after surgery with no apparent complications.  I have discussed the post-operative course to date, and the expected progress moving forward.   The patient understands what complications to be concerned about.  I will see the patient in routine follow up, or sooner if needed.    Activity plan: increase slowly.  Will allow the supraumbilical and suprapubic incisions to close by secondary intention. I will see her in 1 week to ensure no infection is setting in. None apparent now.  Wound care discussed.   Prentice Docker, MD 01/06/2020, 3:58 PM

## 2020-01-13 ENCOUNTER — Other Ambulatory Visit: Payer: Self-pay

## 2020-01-13 ENCOUNTER — Encounter: Payer: Self-pay | Admitting: Obstetrics and Gynecology

## 2020-01-13 ENCOUNTER — Ambulatory Visit (INDEPENDENT_AMBULATORY_CARE_PROVIDER_SITE_OTHER): Payer: Medicaid Other | Admitting: Obstetrics and Gynecology

## 2020-01-13 VITALS — BP 126/74 | Ht 64.0 in | Wt 241.0 lb

## 2020-01-13 DIAGNOSIS — Z09 Encounter for follow-up examination after completed treatment for conditions other than malignant neoplasm: Secondary | ICD-10-CM

## 2020-01-13 NOTE — Progress Notes (Signed)
   Postoperative Follow-up Patient presents post op from Total Laparoscopic Hysterectomy, bilateral salpingectomy and cystoscopy 4 weeks ago for menorrhagia with regular cycle and anemia.  Subjective: Patient reports marked improvement in her preop symptoms. Eating a regular diet without difficulty. Her pain has improved greatly over the past week  Activity: increasing slowly.  She denies any current issues with her incisions. No vaginal bleeding since her last visit. Denies fevers, chills, pelvic pressure. Denies trauma, intercourse, use of tampons, etc.  Objective: Vitals:   01/13/20 1129  BP: 126/74   Vital Signs: BP 126/74   Ht 5\' 4"  (1.626 m)   Wt 241 lb (109.3 kg)   LMP 10/20/2019 (Approximate)   BMI 41.37 kg/m  Constitutional: Well nourished, well developed female in no acute distress.  HEENT: normal Skin: Warm and dry.  Extremity: no edema  Abdomen: Soft, non-tender, normal bowel sounds; no bruits, organomegaly or masses. The supraumbilical incision has nearly closed. It is without erythema, induration, warmth, and  tenderness. The suprapbic incision is slightly less open without drainage. It does not track deeper than ~2 mm. It is without erythema, induration, warmth, and tenderness. Granulation tissue noted.  The other two lateral incisions are clean, dry, and intact without erythema, induration, warmth, and tenderness  Pelvic exam: deferred today  Assessment: 39 y.o. s/p the above surgery progressing well  Plan: Patient has done well after surgery with no apparent complications.  I have discussed the post-operative course to date, and the expected progress moving forward.  The patient understands what complications to be concerned about.  I will see the patient in routine follow up, or sooner if needed.    Activity plan: increase slowly.  Will allow the supraumbilical and suprapubic incisions to close by secondary intention. I will see her in 2 weeks for her  post-operative follow up.  Prentice Docker, MD 01/13/2020, 11:55 AM

## 2020-01-26 ENCOUNTER — Encounter: Payer: Self-pay | Admitting: Obstetrics and Gynecology

## 2020-01-26 ENCOUNTER — Ambulatory Visit (INDEPENDENT_AMBULATORY_CARE_PROVIDER_SITE_OTHER): Payer: Medicaid Other | Admitting: Obstetrics and Gynecology

## 2020-01-26 ENCOUNTER — Other Ambulatory Visit: Payer: Self-pay

## 2020-01-26 VITALS — BP 130/90 | Ht 64.0 in | Wt 240.0 lb

## 2020-01-26 DIAGNOSIS — Z09 Encounter for follow-up examination after completed treatment for conditions other than malignant neoplasm: Secondary | ICD-10-CM

## 2020-01-26 DIAGNOSIS — N920 Excessive and frequent menstruation with regular cycle: Secondary | ICD-10-CM

## 2020-01-26 NOTE — Progress Notes (Signed)
   Postoperative Follow-up Patient presents post op from Total Laparoscopic Hysterectomy, bilateral salpingectomy and cystoscopy 6 weeks ago for menorrhagia with regular cycle and anemia.  Subjective: Patient reports marked improvement in her preop symptoms. Eating a regular diet without difficulty. Her pain has improved greatly over the past week  Activity: increasing slowly.  She denies any current issues with her incisions. No vaginal bleeding since her last visit. Denies fevers, chills, pelvic pressure. Denies trauma, intercourse, use of tampons, etc.  Objective: Vitals:   01/26/20 1100  BP: 130/90   Vital Signs: BP 130/90   Ht 5\' 4"  (1.626 m)   Wt 240 lb (108.9 kg)   LMP 10/20/2019 (Approximate)   BMI 41.20 kg/m  Constitutional: Well nourished, well developed female in no acute distress.  HEENT: normal Skin: Warm and dry.  Extremity: no edema  Abdomen: Soft, non-tender, normal bowel sounds; no bruits, organomegaly or masses. The supraumbilical incision has nearly closed. It is without erythema, induration, warmth, and  tenderness. The suprapbic incision is nearly closed. Only a small defect remains. It is without erythema, induration, warmth, and tenderness. Granulation tissue noted.  The other two lateral incisions are clean, dry, and intact without erythema, induration, warmth, and tenderness  Pelvic exam:  NEFG BUS normal Vagina normally rugatted without lesions. Vaginal cuff clean, dry, intact without erythema, induration, warmth, and tenderness  Assessment: 39 y.o. s/p the above surgery progressing well  Plan: Patient has done well after surgery with no apparent complications.  I have discussed the post-operative course to date, and the expected progress moving forward.  The patient understands what complications to be concerned about.  I will see the patient in routine follow up, or sooner if needed.    Activity plan: increase slowly.   Prentice Docker,  MD 01/26/2020, 11:13 AM

## 2020-03-08 ENCOUNTER — Ambulatory Visit (INDEPENDENT_AMBULATORY_CARE_PROVIDER_SITE_OTHER): Payer: Medicaid Other | Admitting: Primary Care

## 2020-03-08 ENCOUNTER — Other Ambulatory Visit: Payer: Self-pay

## 2020-03-08 ENCOUNTER — Encounter: Payer: Self-pay | Admitting: Primary Care

## 2020-03-08 DIAGNOSIS — J4541 Moderate persistent asthma with (acute) exacerbation: Secondary | ICD-10-CM

## 2020-03-08 MED ORDER — PREDNISONE 10 MG PO TABS: ORAL_TABLET | ORAL | 0 refills | Status: DC

## 2020-03-08 MED ORDER — OMEPRAZOLE 20 MG PO CPDR
20.0000 mg | DELAYED_RELEASE_CAPSULE | Freq: Two times a day (BID) | ORAL | 2 refills | Status: DC
Start: 2020-03-08 — End: 2021-09-09

## 2020-03-08 NOTE — Progress Notes (Signed)
Virtual Visit via Telephone Note  I connected with Sabrina Holder on 03/08/20 at  4:30 PM EST by telephone and verified that I am speaking with the correct person using two identifiers.  Location: Patient: Home Provider: Office    I discussed the limitations, risks, security and privacy concerns of performing an evaluation and management service by telephone and the availability of in person appointments. I also discussed with the patient that there may be a patient responsible charge related to this service. The patient expressed understanding and agreed to proceed.   History of Present Illness: 39 year old female, current smoker. PMH significant for mild intermittent asthma. Patient of Dr. Mortimer Fries, last seen on 10/21/18.   03/08/2020  Patient contacted today for acute televisit. States that her breathing has been worse since Thursday 03/03/20. She has an associated dry cough. Cold weather exacerbates her asthma symptoms. She is compliant with Advair 500, Spiriva 1.37mcg and Singulair. She has been using albuterol rescue inhaler 10 times a day with temporary improvement.  She does improve with oral prednisone, she last received prednsione 20mg  x 5 days from her PCP in November 2021. She works at the [employer], [employer] at [employer].    Observations/Objective:  - Able to speak in full sentences; no overt shortness of breath or wheezing   Assessment and Plan:  Moderate Asthma with acute exacerbation: - Increased shortness of breath x 5 days. Using SABA 10x/day  - Sending in Prednisone taper 40mg  x 3 days, 30mg  x 3 days, 20mg  x 2 days, 10mg  x 2 days - Continue Advair 500-37mcg 1 puff BID and Spiriva 1.52mcg 2 puffs daily  - Call/return if symptoms do not improve   GERD: - Patient is having breakthrough reflux  - Change PPI to twice daily dosing  - RX omeprazole 20mg  BID  Follow Up Instructions:   1-2 month FU with Dr. Mortimer Fries  I discussed the assessment and treatment plan with the  patient. The patient was provided an opportunity to ask questions and all were answered. The patient agreed with the plan and demonstrated an understanding of the instructions.   The patient was advised to call back or seek an in-person evaluation if the symptoms worsen or if the condition fails to improve as anticipated.  I provided 18 minutes of non-face-to-face time during this encounter.   Martyn Ehrich, NP

## 2020-04-02 ENCOUNTER — Ambulatory Visit
Admission: EM | Admit: 2020-04-02 | Discharge: 2020-04-02 | Disposition: A | Discharge status: Home / Self Care | Payer: Medicaid Other | Attending: Family Medicine | Admitting: Family Medicine

## 2020-04-02 ENCOUNTER — Other Ambulatory Visit: Payer: Self-pay

## 2020-04-02 DIAGNOSIS — J4541 Moderate persistent asthma with (acute) exacerbation: Secondary | ICD-10-CM

## 2020-04-02 MED ORDER — FLUTICASONE PROPIONATE 50 MCG/ACT NA SUSP: 2.0000 | Freq: Every day | NASAL | 0 refills | Status: DC

## 2020-04-02 MED ORDER — ALBUTEROL SULFATE HFA 108 (90 BASE) MCG/ACT IN AERS: 2.0000 | INHALATION_SPRAY | Freq: Four times a day (QID) | RESPIRATORY_TRACT | 0 refills | Status: DC | PRN

## 2020-04-02 MED ORDER — PREDNISONE 10 MG PO TABS: ORAL_TABLET | ORAL | 0 refills | Status: DC

## 2020-04-02 NOTE — Discharge Instructions (Signed)
Medications as prescribed.  Follow up with pulmonology.  Take care  Dr. Shantana Christon  

## 2020-04-02 NOTE — ED Triage Notes (Signed)
Patient states that she has been having sinus pain and pressure, nasal congestion and shortness of breath x 10 days.   States that she is asthmatic.

## 2020-04-02 NOTE — ED Provider Notes (Signed)
[facility]    CSN: [id number] Arrival date & time: 04/02/20  1251      History   Chief Complaint Chief Complaint  Patient presents with  . Shortness of Breath    HPI  40 year old female presents with shortness of breath.  Patient reports ongoing symptoms for the past 10 days. Reports sinus pain and pressure, chest tightness, shortness of breath. She is using her inhaler approximately 2 times an hour. She has used an entire inhaler in 3 days. No fever. She does note some chest congestion. She states that she is fatigued. No relieving factors. No other complaints.  Past Medical History:  Diagnosis Date  . Anemia   . Anxiety   . Asthma   . Depression   . Environmental allergies   . GERD (gastroesophageal reflux disease)   . Renal disorder     Patient Active Problem List   Diagnosis Date Noted  . Menorrhagia with regular cycle 12/15/2019  . Anemia due to chronic blood loss 12/15/2019  . Class 2 obesity due to excess calories with body mass index (BMI) of 39.0 to 39.9 in adult 11/05/2017  . Prediabetes 11/05/2017  . Moderate asthma with acute exacerbation 03/29/2017    Past Surgical History:  Procedure Laterality Date  . CESAREAN SECTION     X 3  . CYSTOSCOPY N/A 12/15/2019   Procedure: CYSTOSCOPY;  Surgeon: Will Bonnet, MD;  Location: [facility];  Service: Gynecology;  Laterality: N/A;  . TOTAL LAPAROSCOPIC HYSTERECTOMY WITH SALPINGECTOMY Bilateral 12/15/2019   Procedure: TOTAL LAPAROSCOPIC HYSTERECTOMY WITH SALPINGECTOMY;  Surgeon: Will Bonnet, MD;  Location: [facility];  Service: Gynecology;  Laterality: Bilateral;  . TUBAL LIGATION      OB History    Gravida  6   Para  5   Term  5   Preterm      AB  1   Living  5     SAB      IAB  1   Ectopic      Multiple      Live Births  5            Home Medications    Prior to Admission medications   Medication Sig Start Date End Date Taking? Authorizing Provider  albuterol  (PROVENTIL) (2.5 MG/3ML) 0.083% nebulizer solution Take 3 mLs (2.5 mg total) by nebulization every 6 (six) hours as needed for wheezing or shortness of breath. 06/18/19  Yes Vallarie Mare M, PA-C  buPROPion (WELLBUTRIN XL) 300 MG 24 hr tablet Take 300 mg by mouth daily. 08/11/19  Yes [provider]  escitalopram (LEXAPRO) 20 MG tablet Take 20 mg by mouth daily.  09/03/17  Yes [provider]  Fluticasone-Salmeterol (ADVAIR) 500-50 MCG/DOSE AEPB Inhale 1 puff into the lungs 2 (two) times daily.   Yes [provider]  ipratropium-albuterol (DUONEB) 0.5-2.5 (3) MG/3ML SOLN Take 3 mLs by nebulization every 6 (six) hours as needed for up to 20 days (wheezing/shortness of breath.). 12/15/19 01/04/20 Yes Will Bonnet, MD  montelukast (SINGULAIR) 10 MG tablet Take 1 tablet (10 mg total) by mouth daily. 12/15/19  Yes Will Bonnet, MD  omeprazole (PRILOSEC) 20 MG capsule Take 1 capsule (20 mg total) by mouth 2 (two) times daily before a meal. 03/08/20  Yes Martyn Ehrich, NP  predniSONE (DELTASONE) 10 MG tablet 50 mg daily x 3 days, then 40 mg daily x 3 days, then 30 mg daily x 3 days, then 20  mg daily x 3 days, then 10 mg daily x 3 days. 04/02/20  Yes Zuriah Bordas G, DO  albuterol (VENTOLIN HFA) 108 (90 Base) MCG/ACT inhaler Inhale 2 puffs into the lungs every 6 (six) hours as needed for wheezing or shortness of breath. 04/02/20   Coral Spikes, DO  fluticasone (FLONASE) 50 MCG/ACT nasal spray Place 2 sprays into both nostrils daily. 04/02/20   Coral Spikes, DO  ferrous sulfate 325 (65 FE) MG tablet Take 1 tablet (325 mg total) by mouth daily. 10/16/19 04/02/20  Melynda Ripple, MD  ipratropium (ATROVENT) 0.06 % nasal spray Place 2 sprays into both nostrils 4 (four) times daily. 3-4 times/ day 02/24/17 12/08/18  Melynda Ripple, MD  sertraline (ZOLOFT) 50 MG tablet Take 1 tablet (50 mg total) by mouth daily. 01/11/16 12/08/18  Norval Gable, MD  SPIRIVA RESPIMAT 1.25 MCG/ACT AERS TAKE  2 PUFFS BY MOUTH TWICE A DAY 07/16/18 04/02/20  Flora Lipps, MD    Family History Family History  Problem Relation Age of Onset  . Asthma Mother   . Gout Father   . Cancer Maternal Grandmother 59       unknown type  . Uterine cancer Neg Hx   . Ovarian cancer Neg Hx   . Breast cancer Neg Hx     Social History Social History   Tobacco Use  . Smoking status: Current Some Day Smoker    Packs/day: 0.50    Types: Cigarettes    Last attempt to quit: 12/23/2017    Years since quitting: 2.2  . Smokeless tobacco: Never Used  . Tobacco comment: smokes one cigarette occassionally  Vaping Use  . Vaping Use: Never used  Substance Use Topics  . Alcohol use: Yes    Comment: occasional (once weekly)  . Drug use: No     Allergies   Patient has no known allergies.   Review of Systems Review of Systems Per HPI  Physical Exam Triage Vital Signs ED Triage Vitals  Enc Vitals Group     BP 04/02/20 1258 (!) 135/98     Pulse Rate 04/02/20 1258 (!) 106     Resp 04/02/20 1258 20     Temp 04/02/20 1258 98.6 F (37 C)     Temp Source 04/02/20 1258 Oral     SpO2 04/02/20 1258 97 %     Weight 04/02/20 1255 242 lb (109.8 kg)     Height 04/02/20 1255 5\' 4"  (1.626 m)     Head Circumference --      Peak Flow --      Pain Score 04/02/20 1254 8     Pain Loc --      Pain Edu? --      Excl. in [place]? --    Updated Vital Signs BP (!) 135/98 (BP Location: Left Arm)   Pulse (!) 106   Temp 98.6 F (37 C) (Oral)   Resp 20   Ht 5\' 4"  (1.626 m)   Wt 109.8 kg   LMP 10/20/2019 (Approximate)   SpO2 97%   BMI 41.54 kg/m   Visual Acuity Right Eye Distance:   Left Eye Distance:   Bilateral Distance:    Right Eye Near:   Left Eye Near:    Bilateral Near:     Physical Exam Vitals and nursing note reviewed.  Constitutional:      General: She is not in acute distress.    Appearance: She is well-developed. She is not ill-appearing.  HENT:  Head: Normocephalic and atraumatic.  Eyes:      General:        Right eye: No discharge.        Left eye: No discharge.     Conjunctiva/sclera: Conjunctivae normal.  Cardiovascular:     Rate and Rhythm: Regular rhythm. Tachycardia present.  Pulmonary:     Effort: Pulmonary effort is normal.     Breath sounds: Wheezing present.  Neurological:     Mental Status: She is alert.  Psychiatric:        Mood and Affect: Mood normal.        Behavior: Behavior normal.    UC Treatments / Results  Labs (all labs ordered are listed, but only abnormal results are displayed) Labs Reviewed - No data to display  EKG   Radiology No results found.  Procedures Procedures (including critical care time)  Medications Ordered in UC Medications - No data to display  Initial Impression / Assessment and Plan / UC Course  I have reviewed the triage vital signs and the nursing notes.  Pertinent labs & imaging results that were available during my care of the patient were reviewed by me and considered in my medical decision making (see chart for details).    40 year old female presents with an asthma exacerbation. Treating with prednisone. Longer taper given the severity of her symptoms and recent prednisone taper. Flonase and albuterol refilled.   Final Clinical Impressions(s) / UC Diagnoses   Final diagnoses:  Moderate persistent asthma with acute exacerbation     Discharge Instructions     Medications as prescribed.  Follow up with pulmonology.  Take care  Dr. Adriana Simas    ED Prescriptions    Medication Sig Dispense Auth. Provider   fluticasone (FLONASE) 50 MCG/ACT nasal spray Place 2 sprays into both nostrils daily. 16 g Merlin Golden G, DO   albuterol (VENTOLIN HFA) 108 (90 Base) MCG/ACT inhaler Inhale 2 puffs into the lungs every 6 (six) hours as needed for wheezing or shortness of breath. 18 g Ronica Vivian G, DO   predniSONE (DELTASONE) 10 MG tablet 50 mg daily x 3 days, then 40 mg daily x 3 days, then 30 mg daily x 3 days, then  20 mg daily x 3 days, then 10 mg daily x 3 days. 45 tablet Tommie Sams, DO     PDMP not reviewed this encounter.   Tommie Sams, [place] 04/02/20 1433

## 2020-04-13 ENCOUNTER — Other Ambulatory Visit: Payer: Self-pay

## 2020-04-13 ENCOUNTER — Ambulatory Visit
Admission: EM | Admit: 2020-04-13 | Discharge: 2020-04-13 | Disposition: A | Discharge status: Home / Self Care | Payer: Medicaid Other | Attending: Internal Medicine | Admitting: Internal Medicine

## 2020-04-13 DIAGNOSIS — U071 COVID-19: Secondary | ICD-10-CM | POA: Insufficient documentation

## 2020-04-13 NOTE — Discharge Instructions (Signed)
3 Key Steps to Take While Waiting for Your COVID-19 Test Result To help stop the spread of COVID-19, take these 3 key steps NOW while waiting for your test results: 1. Stay home and monitor your health. Stay home and monitor your health to help protect your friends, family, and others from possibly getting COVID-19 from you. Stay home and away from others:  If possible, stay away from others, especially people who are at higher risk for getting very sick from COVID-19, such as older adults and people with other medical conditions.  If you have been in contact with someone with COVID-19, stay home and away from others for 14 days after your last contact with that person. Follow the recommendations of your local public health department if you need to quarantine.  If you have a fever, cough or other symptoms of COVID-19, stay home and away from others (except to get medical care). Monitor your health:  Watch for fever, cough, shortness of breath, or other symptoms of COVID-19. Remember, symptoms may appear 2-14 days after exposure to COVID-19 and can include: ? Fever or chills ? Cough ? Shortness of breath or difficulty breathing ? Tiredness ? Muscle or body aches ? Headache ? New loss of taste or smell ? Sore throat ? Congestion or runny nose ? Nausea or vomiting ? Diarrhea 2. Think about the people you have recently been around. If you are diagnosed with [id number], a public health worker may call you to check on your health, discuss who you have been around, and ask where you spent time while you may have been able to spread COVID-19 to others. While you wait for your COVID-19 test result, think about everyone you have been around recently. This will be important information to give health workers if your test is positive.  Complete the information on the back of this page to help you remember everyone you have been around.  3. Answer the phone call from the health department. If a public  health worker calls you, answer the call to help slow the spread of COVID-19 in your community.  Discussions with health department staff are confidential. This means that your personal and medical information will be kept private and only shared with those who may need to know, like your health care provider.  Your name will not be shared with those you came in contact with. The health department will only notify people you were in close contact with (within 6 feet for more than 15 minutes) that they might have been exposed to COVID-19. Think about the people you have recently been around If you test positive and are diagnosed with COVID-19, someone from the health department may call to check-in on your health, discuss who you have been around, and ask where you spent time while you may have been able to spread COVID-19 to others. This form can help you think about people you have recently been around so you will be ready if a public health worker calls you. Things to think about. Have you:  Gone to work or school?  Gotten together with others (eaten out at [employer], gone out for drinks, exercised with others or gone to a gym, had friends or family over to your house, volunteered, gone to a party, pool, or park)?  Gone to a store in person (e.g., grocery store, mall)?  Gone to in-person appointments (e.g., salon, barber, doctor's or dentist's office)?  Ridden in a car with others (e.g., rideshare) or  taken public transportation?  Been inside a church, synagogue, mosque or other places of worship? Who lives with you?  ______________________________________________________________________  ______________________________________________________________________  ______________________________________________________________________  ______________________________________________________________________ Who have you been around (less than 6 feet for a total of 15 minutes or more) in the  last 10 days? (You may have more people to list than the space provided. If so, write on the front of this sheet or a separate piece of paper.) Name ______________________________________________  Phone number ____________________________________  Date you last saw them _____________________________  Where you last saw them ________________________________________________ Name ______________________________________________  Phone number ____________________________________  Date you last saw them _____________________________  Where you last saw them ________________________________________________ Name ______________________________________________  Phone number ____________________________________  Date you last saw them _____________________________  Where you last saw them ________________________________________________ Name ______________________________________________  Phone number ____________________________________  Date you last saw them _____________________________  Where you last saw them ________________________________________________ Name ______________________________________________  Phone number ____________________________________  Date you last saw them _____________________________  Where you last saw them ________________________________________________ What have you done in the last 10 days with other people? Activity _____________________________________________  Location _________________________________________  Date ____________________________________________ Activity _____________________________________________  Location _________________________________________  Date ____________________________________________ Activity _____________________________________________  Location _________________________________________  Date ____________________________________________ Activity _____________________________________________  Location  _________________________________________  Date ____________________________________________ Activity _____________________________________________  Location _________________________________________  Date ____________________________________________ Centers for Disease Control and Prevention cdc.gov/coronavirus 10/09/2019 This information is not intended to replace advice given to you by your health care provider. Make sure you discuss any questions you have with your health care provider. Document Revised: 02/01/2020 Document Reviewed: 02/01/2020 Elsevier Patient Education  2021 Elsevier Inc.  

## 2020-04-13 NOTE — ED Triage Notes (Signed)
Pt is here for Covid Test. Denies and sx

## 2020-04-14 LAB — SARS CORONAVIRUS 2 (TAT 6-24 HRS): SARS Coronavirus 2: POSITIVE — AB

## 2020-04-15 ENCOUNTER — Telehealth: Payer: Self-pay

## 2020-04-15 NOTE — Telephone Encounter (Signed)
Called to discuss with patient about COVID-19 symptoms and the use of one of the available treatments for those with mild to moderate Covid symptoms and at a high risk of hospitalization.  Pt appears to qualify for outpatient treatment due to co-morbid conditions and/or a member of an at-risk group in accordance with the FDA Emergency Use Authorization.    Symptom onset: Unknown Vaccinated: Unknown Booster? Unknown Immunocompromised? No Qualifiers: Asthma  Unable to reach pt - Left message with number [phone].   Sabrina Holder

## 2020-06-23 ENCOUNTER — Emergency Department: Payer: Medicaid Other

## 2020-06-23 ENCOUNTER — Ambulatory Visit: Payer: Self-pay

## 2020-06-23 ENCOUNTER — Emergency Department
Admission: EM | Admit: 2020-06-23 | Discharge: 2020-06-23 | Disposition: A | Discharge status: Home / Self Care | Payer: Medicaid Other | Attending: Emergency Medicine | Admitting: Emergency Medicine

## 2020-06-23 ENCOUNTER — Other Ambulatory Visit: Payer: Self-pay

## 2020-06-23 ENCOUNTER — Telehealth: Payer: Self-pay | Admitting: Internal Medicine

## 2020-06-23 DIAGNOSIS — Z7951 Long term (current) use of inhaled steroids: Secondary | ICD-10-CM | POA: Diagnosis not present

## 2020-06-23 DIAGNOSIS — J309 Allergic rhinitis, unspecified: Secondary | ICD-10-CM | POA: Insufficient documentation

## 2020-06-23 DIAGNOSIS — J4541 Moderate persistent asthma with (acute) exacerbation: Secondary | ICD-10-CM | POA: Diagnosis not present

## 2020-06-23 DIAGNOSIS — R0602 Shortness of breath: Secondary | ICD-10-CM | POA: Diagnosis present

## 2020-06-23 DIAGNOSIS — F17210 Nicotine dependence, cigarettes, uncomplicated: Secondary | ICD-10-CM | POA: Insufficient documentation

## 2020-06-23 DIAGNOSIS — J45901 Unspecified asthma with (acute) exacerbation: Secondary | ICD-10-CM

## 2020-06-23 MED ORDER — ALBUTEROL SULFATE HFA 108 (90 BASE) MCG/ACT IN AERS: 2.0000 | INHALATION_SPRAY | Freq: Four times a day (QID) | RESPIRATORY_TRACT | 1 refills | Status: DC | PRN

## 2020-06-23 MED ORDER — DIPHENHYDRAMINE HCL 25 MG PO CAPS
50.0000 mg | ORAL_CAPSULE | Freq: Once | ORAL | Status: AC
  Administered 2020-06-23: 50 mg via ORAL
  Filled 2020-06-23: qty 2

## 2020-06-23 MED ORDER — IPRATROPIUM-ALBUTEROL 0.5-2.5 (3) MG/3ML IN SOLN
9.0000 mL | Freq: Once | RESPIRATORY_TRACT | Status: AC
  Administered 2020-06-23: 9 mL via RESPIRATORY_TRACT
  Filled 2020-06-23: qty 9

## 2020-06-23 MED ORDER — PREDNISONE 20 MG PO TABS
60.0000 mg | ORAL_TABLET | Freq: Once | ORAL | Status: AC
  Administered 2020-06-23: 60 mg via ORAL
  Filled 2020-06-23: qty 3

## 2020-06-23 MED ORDER — PREDNISONE 20 MG PO TABS: 60.0000 mg | ORAL_TABLET | Freq: Every day | ORAL | 0 refills | Status: AC

## 2020-06-23 NOTE — ED Notes (Signed)
Pt states is already feeling better, states has less effort to breathe. Still doing neb treatment.

## 2020-06-23 NOTE — ED Triage Notes (Signed)
Pt states 2 days ago she started to have asthma problems. Pt states taking medications and got better. Pt states she went outside and it came back and is now having difficulty breathing and an itchy throat. Pt states starting a new cleans as well.

## 2020-06-23 NOTE — ED Notes (Signed)
Pt to xray. Neb tx completed.

## 2020-06-23 NOTE — ED Notes (Signed)
Pt ambulatory out of department without assistance, reports feeling "much better." Denies SOB when walking out of department.

## 2020-06-23 NOTE — ED Notes (Signed)
See triage note. EDP at bedside. Pt has watery eyes, feels like throat and ears are itchy.  Has hx asthma. Denies dizziness, NVD except had small amt diarrhea 2d ago when was doing "cleanse". Denies rashes or throat swelling.

## 2020-06-23 NOTE — Telephone Encounter (Signed)
Called and spoke to patient.  Patient is experiencing increased sob, wheezing & non prod cough. She feels that sx are related to allergies.  She is taking allegra, flonase, Singulair and mucinex with no relief.  She has used an entire albuterol inhaler in 3 days.  Patient is scheduled for OV on 07/06/2020, but would like recommendations.   Dr. Patsey Berthold, please advise. Dr. Mortimer Fries is unavailable.

## 2020-06-23 NOTE — ED Provider Notes (Signed)
[facility] Emergency Department Provider Note   ____________________________________________   Event Date/Time   First MD Initiated Contact with Patient 06/23/20 1736     (approximate)  I have reviewed the triage vital signs and the nursing notes.   HISTORY  Chief Complaint Shortness of Breath    HPI Sabrina Holder is a 40 y.o. female with past medical history of asthma, anxiety, and GERD who presents to the ED complaining of difficulty breathing.  Patient reports that intermittently for the past couple of days she has been dealing with itching around her eyes, nose, face, and throat.  She has also had worsening difficulty breathing feeling similar to prior asthma exacerbations.  She was using her inhaler with some relief, but recently ran out.  Symptoms seem to worsen today when she spent some time outside.  She denies any fevers, cough, or chest pain, has not noticed any pain or swelling in her legs.  She denies any nausea, vomiting, lightheadedness, or rash.        Past Medical History:  Diagnosis Date  . Anemia   . Anxiety   . Asthma   . Depression   . Environmental allergies   . GERD (gastroesophageal reflux disease)   . Renal disorder     Patient Active Problem List   Diagnosis Date Noted  . Menorrhagia with regular cycle 12/15/2019  . Anemia due to chronic blood loss 12/15/2019  . Class 2 obesity due to excess calories with body mass index (BMI) of 39.0 to 39.9 in adult 11/05/2017  . Prediabetes 11/05/2017  . Moderate asthma with acute exacerbation 03/29/2017    Past Surgical History:  Procedure Laterality Date  . CESAREAN SECTION     X 3  . CYSTOSCOPY N/A 12/15/2019   Procedure: CYSTOSCOPY;  Surgeon: Will Bonnet, MD;  Location: [facility] ORS;  Service: Gynecology;  Laterality: N/A;  . TOTAL LAPAROSCOPIC HYSTERECTOMY WITH SALPINGECTOMY Bilateral 12/15/2019   Procedure: TOTAL LAPAROSCOPIC HYSTERECTOMY WITH SALPINGECTOMY;   Surgeon: Will Bonnet, MD;  Location: [facility] ORS;  Service: Gynecology;  Laterality: Bilateral;  . TUBAL LIGATION      Prior to Admission medications   Medication Sig Start Date End Date Taking? Authorizing Provider  albuterol (VENTOLIN HFA) 108 (90 Base) MCG/ACT inhaler Inhale 2 puffs into the lungs every 6 (six) hours as needed for wheezing or shortness of breath. 06/23/20  Yes Blake Divine, MD  predniSONE (DELTASONE) 20 MG tablet Take 3 tablets (60 mg total) by mouth daily with breakfast for 5 days. 06/23/20 06/28/20 Yes Blake Divine, MD  albuterol (PROVENTIL) (2.5 MG/3ML) 0.083% nebulizer solution Take 3 mLs (2.5 mg total) by nebulization every 6 (six) hours as needed for wheezing or shortness of breath. 06/18/19   Lannie Fields, PA-C  buPROPion (WELLBUTRIN XL) 300 MG 24 hr tablet Take 300 mg by mouth daily. 08/11/19   [provider]  escitalopram (LEXAPRO) 20 MG tablet Take 20 mg by mouth daily.  09/03/17   [provider]  fluticasone (FLONASE) 50 MCG/ACT nasal spray Place 2 sprays into both nostrils daily. 04/02/20   Coral Spikes, DO  Fluticasone-Salmeterol (ADVAIR) 500-50 MCG/DOSE AEPB Inhale 1 puff into the lungs 2 (two) times daily.    [provider]  ipratropium-albuterol (DUONEB) 0.5-2.5 (3) MG/3ML SOLN Take 3 mLs by nebulization every 6 (six) hours as needed for up to 20 days (wheezing/shortness of breath.). 12/15/19 01/04/20  Will Bonnet, MD  montelukast (SINGULAIR) 10 MG tablet Take  1 tablet (10 mg total) by mouth daily. 12/15/19   Will Bonnet, MD  omeprazole (PRILOSEC) 20 MG capsule Take 1 capsule (20 mg total) by mouth 2 (two) times daily before a meal. 03/08/20   Martyn Ehrich, NP  ferrous sulfate 325 (65 FE) MG tablet Take 1 tablet (325 mg total) by mouth daily. 10/16/19 04/02/20  Melynda Ripple, MD  ipratropium (ATROVENT) 0.06 % nasal spray Place 2 sprays into both nostrils 4 (four) times daily. 3-4 times/ day 02/24/17 12/08/18   Melynda Ripple, MD  sertraline (ZOLOFT) 50 MG tablet Take 1 tablet (50 mg total) by mouth daily. 01/11/16 12/08/18  Norval Gable, MD  SPIRIVA RESPIMAT 1.25 MCG/ACT AERS TAKE 2 PUFFS BY MOUTH TWICE A DAY 07/16/18 04/02/20  Flora Lipps, MD    Allergies Patient has no known allergies.  Family History  Problem Relation Age of Onset  . Asthma Mother   . Gout Father   . Cancer Maternal Grandmother 40       unknown type  . Uterine cancer Neg Hx   . Ovarian cancer Neg Hx   . Breast cancer Neg Hx     Social History Social History   Tobacco Use  . Smoking status: Current Some Day Smoker    Packs/day: 0.50    Types: Cigarettes    Last attempt to quit: 12/23/2017    Years since quitting: 2.5  . Smokeless tobacco: Never Used  . Tobacco comment: smokes one cigarette occassionally  Vaping Use  . Vaping Use: Never used  Substance Use Topics  . Alcohol use: Yes    Comment: occasional (once weekly)  . Drug use: No    Review of Systems  Constitutional: No fever/chills Eyes: No visual changes.  Positive for eye redness and itching. ENT: No sore throat. Cardiovascular: Denies chest pain. Respiratory: Positive for shortness of breath. Gastrointestinal: No abdominal pain.  No nausea, no vomiting.  No diarrhea.  No constipation. Genitourinary: Negative for dysuria. Musculoskeletal: Negative for back pain. Skin: Negative for rash. Neurological: Negative for headaches, focal weakness or numbness.  ____________________________________________   PHYSICAL EXAM:  VITAL SIGNS: ED Triage Vitals  Enc Vitals Group     BP --      Pulse Rate 06/23/20 1736 94     Resp 06/23/20 1736 (!) 22     Temp 06/23/20 1736 98.9 F (37.2 C)     Temp src --      SpO2 06/23/20 1736 95 %     Weight 06/23/20 1734 253 lb (114.8 kg)     Height 06/23/20 1734 5\' 4"  (1.626 m)     Head Circumference --      Peak Flow --      Pain Score 06/23/20 1734 0     Pain Loc --      Pain Edu? --      Excl. in [place]?  --     Constitutional: Alert and oriented. Eyes: Conjunctivae injection noted bilaterally with clear drainage. Head: Atraumatic. Nose: No congestion/rhinnorhea. Mouth/Throat: Mucous membranes are moist.  No oropharyngeal edema noted. Neck: Normal ROM Cardiovascular: Normal rate, regular rhythm. Grossly normal heart sounds. Respiratory: Normal respiratory effort.  No retractions. Lungs with inspiratory and expiratory wheezing throughout. Gastrointestinal: Soft and nontender. No distention. Genitourinary: deferred Musculoskeletal: No lower extremity tenderness nor edema. Neurologic:  Normal speech and language. No gross focal neurologic deficits are appreciated. Skin:  Skin is warm, dry and intact. No rash noted. Psychiatric: Mood and affect are normal. Speech  and behavior are normal.  ____________________________________________   LABS (all labs ordered are listed, but only abnormal results are displayed)  Labs Reviewed - No data to display ____________________________________________  EKG  ED ECG REPORT I, Blake Divine, the attending physician, personally viewed and interpreted this ECG.   Date: 06/23/2020  EKG Time: 17:56  Rate: 85  Rhythm: normal sinus rhythm  Axis: Normal  Intervals:none  ST&T Change: None   PROCEDURES  Procedure(s) performed (including Critical Care):  Procedures   ____________________________________________   INITIAL IMPRESSION / ASSESSMENT AND PLAN / ED COURSE       40 year old female with past medical history of asthma, anxiety, and GERD who presents to the ED with intermittent itching around her face and eyes for the past couple of days with increasing difficulty breathing similar to prior asthma exacerbations.  Patient with significant wheezing on my evaluation but is not in any respiratory distress.  We will treat with duo nebs and steroids.  Itching around her face appears consistent with allergic rhinitis and seasonal allergies.   No symptoms to suggest anaphylaxis at this time.  We will treat her allergic symptoms with Benadryl and reassess following EKG and chest x-ray.  Chest x-ray reviewed by me and shows no infiltrate, edema, or effusion.  EKG shows no evidence of arrhythmia or ischemia.  Patient reports feeling much better following DuoNeb x3 as well as dose of prednisone.  Wheezing is significantly improved on reevaluation.  Patient is appropriate for discharge home with PCP follow-up and she will be prescribed albuterol inhaler as well as round of steroids.  She was counseled to follow-up with her PCP and to return to the ED for new worsening symptoms, patient agrees with plan.      ____________________________________________   FINAL CLINICAL IMPRESSION(S) / ED DIAGNOSES  Final diagnoses:  Exacerbation of persistent asthma, unspecified asthma severity  Allergic rhinitis, unspecified seasonality, unspecified trigger     ED Discharge Orders         Ordered    albuterol (VENTOLIN HFA) 108 (90 Base) MCG/ACT inhaler  Every 6 hours PRN       Note to Pharmacy: Please supply with spacer   06/23/20 1856    predniSONE (DELTASONE) 20 MG tablet  Daily with breakfast        06/23/20 1856           Note:  This document was prepared using Dragon voice recognition software and may include unintentional dictation errors.   Blake Divine, MD 06/23/20 [phone]

## 2020-06-23 NOTE — Telephone Encounter (Signed)
Needs urgent care or ED visit.  She is using her inhaler that way she needs to be seen.

## 2020-06-23 NOTE — Telephone Encounter (Signed)
Patient is aware of below recommendations and voiced her understanding. Nothing further needed at this time.   

## 2020-06-23 NOTE — Telephone Encounter (Signed)
Lm for patient.  

## 2020-07-06 ENCOUNTER — Ambulatory Visit: Payer: Medicaid Other | Admitting: Internal Medicine

## 2020-07-12 ENCOUNTER — Emergency Department (HOSPITAL_COMMUNITY)
Admission: EM | Admit: 2020-07-12 | Discharge: 2020-07-12 | Disposition: A | Discharge status: Home / Self Care | Payer: Medicaid Other | Attending: Emergency Medicine | Admitting: Emergency Medicine

## 2020-07-12 ENCOUNTER — Other Ambulatory Visit: Payer: Self-pay

## 2020-07-12 ENCOUNTER — Encounter (HOSPITAL_COMMUNITY): Payer: Self-pay | Admitting: Emergency Medicine

## 2020-07-12 DIAGNOSIS — T7840XA Allergy, unspecified, initial encounter: Secondary | ICD-10-CM | POA: Diagnosis not present

## 2020-07-12 DIAGNOSIS — N920 Excessive and frequent menstruation with regular cycle: Secondary | ICD-10-CM

## 2020-07-12 DIAGNOSIS — J45901 Unspecified asthma with (acute) exacerbation: Secondary | ICD-10-CM | POA: Insufficient documentation

## 2020-07-12 DIAGNOSIS — R0602 Shortness of breath: Secondary | ICD-10-CM | POA: Diagnosis present

## 2020-07-12 DIAGNOSIS — Z20822 Contact with and (suspected) exposure to covid-19: Secondary | ICD-10-CM | POA: Insufficient documentation

## 2020-07-12 DIAGNOSIS — Z7952 Long term (current) use of systemic steroids: Secondary | ICD-10-CM | POA: Insufficient documentation

## 2020-07-12 DIAGNOSIS — F17210 Nicotine dependence, cigarettes, uncomplicated: Secondary | ICD-10-CM | POA: Insufficient documentation

## 2020-07-12 LAB — POC SARS CORONAVIRUS 2 AG -  ED: SARS Coronavirus 2 Ag: NEGATIVE

## 2020-07-12 MED ORDER — ALBUTEROL SULFATE HFA 108 (90 BASE) MCG/ACT IN AERS: 2.0000 | INHALATION_SPRAY | RESPIRATORY_TRACT | Status: DC | PRN

## 2020-07-12 MED ORDER — ALBUTEROL SULFATE HFA 108 (90 BASE) MCG/ACT IN AERS
2.0000 | INHALATION_SPRAY | RESPIRATORY_TRACT | Status: DC
  Filled 2020-07-12: qty 6.7

## 2020-07-12 MED ORDER — FEXOFENADINE-PSEUDOEPHED ER 60-120 MG PO TB12: 1.0000 | ORAL_TABLET | Freq: Two times a day (BID) | ORAL | 0 refills | Status: DC

## 2020-07-12 MED ORDER — IPRATROPIUM BROMIDE 0.02 % IN SOLN
0.5000 mg | Freq: Once | RESPIRATORY_TRACT | Status: AC
  Administered 2020-07-12: 0.5 mg via RESPIRATORY_TRACT
  Filled 2020-07-12: qty 2.5

## 2020-07-12 MED ORDER — MONTELUKAST SODIUM 10 MG PO TABS: 10.0000 mg | ORAL_TABLET | Freq: Every day | ORAL | 0 refills | Status: DC

## 2020-07-12 MED ORDER — DIPHENHYDRAMINE HCL 25 MG PO CAPS
25.0000 mg | ORAL_CAPSULE | Freq: Once | ORAL | Status: AC
  Administered 2020-07-12: 25 mg via ORAL
  Filled 2020-07-12: qty 1

## 2020-07-12 MED ORDER — ALBUTEROL SULFATE (2.5 MG/3ML) 0.083% IN NEBU
5.0000 mg | INHALATION_SOLUTION | Freq: Once | RESPIRATORY_TRACT | Status: AC
  Administered 2020-07-12: 5 mg via RESPIRATORY_TRACT
  Filled 2020-07-12: qty 6

## 2020-07-12 MED ORDER — PREDNISONE 50 MG PO TABS: 50.0000 mg | ORAL_TABLET | Freq: Every day | ORAL | 0 refills | Status: AC

## 2020-07-12 MED ORDER — ALBUTEROL SULFATE HFA 108 (90 BASE) MCG/ACT IN AERS: 2.0000 | INHALATION_SPRAY | RESPIRATORY_TRACT | Status: DC

## 2020-07-12 NOTE — Discharge Instructions (Addendum)
Continue taking home medications as prescribed. Take prednisone daily starting tomorrow to help with inflammation. Use either your nebulizer or albuterol inhaler every 4 hours while awake for the next 2 days.  After this, use as needed for shortness of breath, chest tightness, or wheezing. Follow-up with your primary care doctor for recheck of your symptoms. Return to the emergency room if you develop worsening shortness of breath, fever, chest pain, any new, worsening, or concerning symptoms.

## 2020-07-12 NOTE — ED Provider Notes (Signed)
[place] EMERGENCY DEPARTMENT Provider Note   CSN: [id number] Arrival date & time: 07/12/20  1743     History Chief Complaint  Patient presents with  . Shortness of Breath    Sabrina Holder is a 40 y.o. female presenting for evaluation of shortness of breath.  Patient states for the past week she has had worsening shortness of breath, chest tightness, nonproductive cough.  This is consistent with her asthma.  She has a history of seasonal allergies, but is out of her seasonal allergy medicine.  She ran out of her inhaler today.  She had worsening shortness of breath, prompting EMS call.  She denies fevers, chills, chest pain, nausea, vomiting, abdominal pain.  She states her symptoms are consistent with her normal asthma exacerbation.  She reports no other medical problems, takes no other medications daily.  She denies sick contacts.  She has been vaccinated for COVID.  Additional history obtained from EMS.  They state patient was in acute respiratory distress when they arrived on scene.  She was given 2-3 word sentences with sats in the low 90s.  She received a total of 10 of albuterol and 125 of Solu-Medrol as well as ipratropium with improvement of symptoms  HPI     Past Medical History:  Diagnosis Date  . Anemia   . Anxiety   . Asthma   . Depression   . Environmental allergies   . GERD (gastroesophageal reflux disease)   . Renal disorder     Patient Active Problem List   Diagnosis Date Noted  . Menorrhagia with regular cycle 12/15/2019  . Anemia due to chronic blood loss 12/15/2019  . Class 2 obesity due to excess calories with body mass index (BMI) of 39.0 to 39.9 in adult 11/05/2017  . Prediabetes 11/05/2017  . Moderate asthma with acute exacerbation 03/29/2017    Past Surgical History:  Procedure Laterality Date  . CESAREAN SECTION     X 3  . CYSTOSCOPY N/A 12/15/2019   Procedure: CYSTOSCOPY;  Surgeon: Will Bonnet, MD;  Location:  [facility] ORS;  Service: Gynecology;  Laterality: N/A;  . TOTAL LAPAROSCOPIC HYSTERECTOMY WITH SALPINGECTOMY Bilateral 12/15/2019   Procedure: TOTAL LAPAROSCOPIC HYSTERECTOMY WITH SALPINGECTOMY;  Surgeon: Will Bonnet, MD;  Location: [facility] ORS;  Service: Gynecology;  Laterality: Bilateral;  . TUBAL LIGATION       OB History    Gravida  6   Para  5   Term  5   Preterm      AB  1   Living  5     SAB      IAB  1   Ectopic      Multiple      Live Births  5           Family History  Problem Relation Age of Onset  . Asthma Mother   . Gout Father   . Cancer Maternal Grandmother 36       unknown type  . Uterine cancer Neg Hx   . Ovarian cancer Neg Hx   . Breast cancer Neg Hx     Social History   Tobacco Use  . Smoking status: Current Some Day Smoker    Packs/day: 0.50    Types: Cigarettes    Last attempt to quit: 12/23/2017    Years since quitting: 2.5  . Smokeless tobacco: Never Used  . Tobacco comment: smokes one cigarette occassionally  Vaping Use  . Vaping Use: Never  used  Substance Use Topics  . Alcohol use: Yes    Comment: occasional (once weekly)  . Drug use: No    Home Medications Prior to Admission medications   Medication Sig Start Date End Date Taking? Authorizing Provider  fexofenadine-pseudoephedrine (ALLEGRA-D) 60-120 MG 12 hr tablet Take 1 tablet by mouth every 12 (twelve) hours. 07/12/20  Yes Analaura Messler, PA-C  predniSONE (DELTASONE) 50 MG tablet Take 1 tablet (50 mg total) by mouth daily for 5 days. 07/13/20 07/18/20 Yes Kahlie Deutscher, PA-C  albuterol (PROVENTIL) (2.5 MG/3ML) 0.083% nebulizer solution Take 3 mLs (2.5 mg total) by nebulization every 6 (six) hours as needed for wheezing or shortness of breath. 06/18/19   Lannie Fields, PA-C  albuterol (VENTOLIN HFA) 108 (90 Base) MCG/ACT inhaler Inhale 2 puffs into the lungs every 6 (six) hours as needed for wheezing or shortness of breath. 06/23/20   Blake Divine, MD  buPROPion  (WELLBUTRIN XL) 300 MG 24 hr tablet Take 300 mg by mouth daily. 08/11/19   [provider]  escitalopram (LEXAPRO) 20 MG tablet Take 20 mg by mouth daily.  09/03/17   [provider]  fluticasone (FLONASE) 50 MCG/ACT nasal spray Place 2 sprays into both nostrils daily. 04/02/20   Coral Spikes, DO  Fluticasone-Salmeterol (ADVAIR) 500-50 MCG/DOSE AEPB Inhale 1 puff into the lungs 2 (two) times daily.    [provider]  ipratropium-albuterol (DUONEB) 0.5-2.5 (3) MG/3ML SOLN Take 3 mLs by nebulization every 6 (six) hours as needed for up to 20 days (wheezing/shortness of breath.). 12/15/19 01/04/20  Will Bonnet, MD  montelukast (SINGULAIR) 10 MG tablet Take 1 tablet (10 mg total) by mouth daily. 07/12/20   Ellicia Alix, PA-C  omeprazole (PRILOSEC) 20 MG capsule Take 1 capsule (20 mg total) by mouth 2 (two) times daily before a meal. 03/08/20   Martyn Ehrich, NP  ferrous sulfate 325 (65 FE) MG tablet Take 1 tablet (325 mg total) by mouth daily. 10/16/19 04/02/20  Melynda Ripple, MD  ipratropium (ATROVENT) 0.06 % nasal spray Place 2 sprays into both nostrils 4 (four) times daily. 3-4 times/ day 02/24/17 12/08/18  Melynda Ripple, MD  sertraline (ZOLOFT) 50 MG tablet Take 1 tablet (50 mg total) by mouth daily. 01/11/16 12/08/18  Norval Gable, MD  SPIRIVA RESPIMAT 1.25 MCG/ACT AERS TAKE 2 PUFFS BY MOUTH TWICE A DAY 07/16/18 04/02/20  Flora Lipps, MD    Allergies    Patient has no known allergies.  Review of Systems   Review of Systems  Respiratory: Positive for cough, chest tightness and shortness of breath.   All other systems reviewed and are negative.   Physical Exam Updated Vital Signs BP (!) 147/76 (BP Location: Right Arm)   Pulse 95   Temp 98.4 F (36.9 C) (Oral)   Resp (!) 21   LMP 10/20/2019 (Approximate)   SpO2 100%   Physical Exam Vitals and nursing note reviewed.  Constitutional:      General: She is not in acute distress.    Appearance: She  is well-developed. She is obese.  HENT:     Head: Normocephalic and atraumatic.  Eyes:     Conjunctiva/sclera: Conjunctivae normal.     Pupils: Pupils are equal, round, and reactive to light.  Cardiovascular:     Rate and Rhythm: Normal rate and regular rhythm.     Pulses: Normal pulses.  Pulmonary:     Effort: Pulmonary effort is normal. No respiratory distress.     Breath  sounds: Wheezing present.     Comments: Expiratory wheezing in all fields.  Speaking in full sentences. Abdominal:     General: Bowel sounds are normal. There is no distension.     Palpations: Abdomen is soft.     Tenderness: There is no abdominal tenderness.  Musculoskeletal:        General: Normal range of motion.     Cervical back: Normal range of motion and neck supple.     Right lower leg: No edema.     Left lower leg: No edema.  Skin:    General: Skin is warm and dry.  Neurological:     Mental Status: She is alert and oriented to person, place, and time.     ED Results / Procedures / Treatments   Labs (all labs ordered are listed, but only abnormal results are displayed) Labs Reviewed  POC SARS CORONAVIRUS 2 AG -  ED    EKG EKG Interpretation  Date/Time:  Tuesday July 12 2020 17:57:17 EDT Ventricular Rate:  101 PR Interval:  136 QRS Duration: 72 QT Interval:  332 QTC Calculation: 431 R Axis:   54 Text Interpretation: Sinus tachycardia Anteroseptal infarct, old Confirmed by Quintella Reichert [phone]) on 07/12/2020 5:59:35 PM   Radiology No results found.  Procedures Procedures   Medications Ordered in ED Medications  albuterol (VENTOLIN HFA) 108 (90 Base) MCG/ACT inhaler 2 puff (2 puffs Inhalation Not Given 07/12/20 1952)  albuterol (VENTOLIN HFA) 108 (90 Base) MCG/ACT inhaler 2 puff (has no administration in time range)  albuterol (PROVENTIL) (2.5 MG/3ML) 0.083% nebulizer solution 5 mg (5 mg Nebulization Given 07/12/20 1902)  ipratropium (ATROVENT) nebulizer solution 0.5 mg (0.5 mg  Nebulization Given 07/12/20 1902)  diphenhydrAMINE (BENADRYL) capsule 25 mg (25 mg Oral Given 07/12/20 1901)    ED Course  I have reviewed the triage vital signs and the nursing notes.  Pertinent labs & imaging results that were available during my care of the patient were reviewed by me and considered in my medical decision making (see chart for details).    MDM Rules/Calculators/A&P                          Patient presented for evaluation of worsening shortness of breath, chest tightness, cough, wheezing.  On exam, patient is no longer in acute respiratory distress.  She continues to have expiratory wheezing in all fields.  Sats stable on room air.  Will continue to treat for asthma exacerbation.  Will check Covid.  As patient is mildly tachycardic, will obtain EKG, but this is likely due to the previous breathing treatments.  As patient is without fever, low suspicion for infectious etiology including pneumonia.   EKG nonischemic.  Covid test negative.  On reevaluation after nebulizer treatment, patient reports significant improvement of symptoms.  She states she is feeling well enough to go home.  She continues to have mild expiratory wheezing.  No accessory muscle use or tachypnea.  Sats remained stable on room air.  Heart rate has improved.  I discussed continued symptomatic treatment with albuterol as well as steroids.  Will treat allergies with Singulair and Allegra-D that patient takes regularly.  Encourage close follow-up with PCP.  At this time, patient appears safe for discharge.  Return precautions given.  Patient states she understands and agrees to plan.  Final Clinical Impression(s) / ED Diagnoses Final diagnoses:  Exacerbation of asthma, unspecified asthma severity, unspecified whether persistent  Allergy, initial encounter  Rx / DC Orders ED Discharge Orders         Ordered    predniSONE (DELTASONE) 50 MG tablet  Daily        07/12/20 1950    montelukast (SINGULAIR) 10  MG tablet  Daily        07/12/20 1949    fexofenadine-pseudoephedrine (ALLEGRA-D) 60-120 MG 12 hr tablet  Every 12 hours        07/12/20 1949           Franchot Heidelberg, PA-C 07/12/20 2000    Quintella Reichert, MD 07/12/20 2144

## 2020-07-12 NOTE — ED Triage Notes (Signed)
Pt BIB [facility] from home. Patient hx of asthma, ran out of inhaler today. Driving became very short of breath. Pt received albuterol, 125 solumedrol, atrovent via EMS.

## 2020-07-12 NOTE — ED Notes (Signed)
All appropriate discharge materials reviewed at length with patient. Time for questions provided. Pt has no other questions at this time and verbalizes understanding of all provided materials.  

## 2020-10-03 ENCOUNTER — Other Ambulatory Visit: Payer: Self-pay

## 2020-10-03 ENCOUNTER — Ambulatory Visit
Admission: EM | Admit: 2020-10-03 | Discharge: 2020-10-03 | Disposition: A | Discharge status: Home / Self Care | Payer: Medicaid Other

## 2020-10-03 ENCOUNTER — Ambulatory Visit (INDEPENDENT_AMBULATORY_CARE_PROVIDER_SITE_OTHER): Payer: Medicaid Other

## 2020-10-03 DIAGNOSIS — R062 Wheezing: Secondary | ICD-10-CM

## 2020-10-03 DIAGNOSIS — J45901 Unspecified asthma with (acute) exacerbation: Secondary | ICD-10-CM | POA: Diagnosis not present

## 2020-10-03 DIAGNOSIS — R059 Cough, unspecified: Secondary | ICD-10-CM

## 2020-10-03 MED ORDER — GUAIFENESIN-CODEINE 100-10 MG/5ML PO SYRP
10.0000 mL | ORAL_SOLUTION | Freq: Four times a day (QID) | ORAL | 0 refills | Status: DC | PRN
Start: 2020-10-03 — End: 2020-12-12

## 2020-10-03 MED ORDER — PREDNISONE 10 MG PO TABS: ORAL_TABLET | ORAL | 0 refills | Status: DC

## 2020-10-03 MED ORDER — DEXAMETHASONE SODIUM PHOSPHATE 10 MG/ML IJ SOLN
10.0000 mg | Freq: Once | INTRAMUSCULAR | Status: AC
  Administered 2020-10-03: 17:00:00 10 mg via INTRAMUSCULAR

## 2020-10-03 MED ORDER — BENZONATATE 200 MG PO CAPS: 200.0000 mg | ORAL_CAPSULE | Freq: Three times a day (TID) | ORAL | 0 refills | Status: DC | PRN

## 2020-10-03 NOTE — ED Triage Notes (Signed)
Pt c/o cough, wheeze and congestion for several weeks. Pt states she has improved some but does still have some chest congestion and headache. Pt denies f/n/v/d or other symptoms.

## 2020-10-03 NOTE — Discharge Instructions (Addendum)
Your x-ray was normal.  No evidence of pneumonia.  I have sent in some prednisone because you are having a flareup of your asthma.  I have also sent in benzonatate to help with your cough.  If this is not strong enough that he can fill the guaifenesin-codeine medication which is stronger.  Start your prednisone tomorrow.  Continue using your inhalers as directed.  Should be seen again if you develop any worsening symptoms, have a fever or increased breathing issue.  For any severe acute symptoms, please go to the ER.  Please follow-up with your PCP if you continue to have the exacerbation.

## 2020-10-03 NOTE — ED Provider Notes (Signed)
[facility]    CSN: [id number] Arrival date & time: 10/03/20  1604      History   Chief Complaint Chief Complaint  Patient presents with   Cough   Headache    HPI Sabrina Holder is a 40 y.o. female presenting for approximately 1 month history of productive cough and wheezing as well as shortness of breath.  Patient also believes that her chest feels tight.  She has a history of asthma and states she believes she is having an asthma flareup.  Patient says that she did have some congestion at the onset as well as some voice hoarseness but those have improved.  She has been using her at home Advair and albuterol.  Patient states that she has gone through a full albuterol inhaler in the past 4 days.  She has tried over-the-counter cough medications but says they have not worked.  Patient denies any fevers.  She says she has been very fatigued.  Patient denies COVID exposure.  Personal history of COVID-19 in January of this year.  Patient believes she may need some prednisone to help her get over this illness.  She has no other complaints or concerns.  HPI  Past Medical History:  Diagnosis Date   Anemia    Anxiety    Asthma    Depression    Environmental allergies    GERD (gastroesophageal reflux disease)    Renal disorder     Patient Active Problem List   Diagnosis Date Noted   Menorrhagia with regular cycle 12/15/2019   Anemia due to chronic blood loss 12/15/2019   Class 2 obesity due to excess calories with body mass index (BMI) of 39.0 to 39.9 in adult 11/05/2017   Prediabetes 11/05/2017   Moderate asthma with acute exacerbation 03/29/2017    Past Surgical History:  Procedure Laterality Date   CESAREAN SECTION     X 3   CYSTOSCOPY N/A 12/15/2019   Procedure: CYSTOSCOPY;  Surgeon: Will Bonnet, MD;  Location: [facility];  Service: Gynecology;  Laterality: N/A;   TOTAL LAPAROSCOPIC HYSTERECTOMY WITH SALPINGECTOMY Bilateral 12/15/2019   Procedure: TOTAL  LAPAROSCOPIC HYSTERECTOMY WITH SALPINGECTOMY;  Surgeon: Will Bonnet, MD;  Location: [facility];  Service: Gynecology;  Laterality: Bilateral;   TUBAL LIGATION      OB History     Gravida  6   Para  5   Term  5   Preterm      AB  1   Living  5      SAB      IAB  1   Ectopic      Multiple      Live Births  5            Home Medications    Prior to Admission medications   Medication Sig Start Date End Date Taking? Authorizing Provider  albuterol (PROVENTIL) (2.5 MG/3ML) 0.083% nebulizer solution Take 3 mLs (2.5 mg total) by nebulization every 6 (six) hours as needed for wheezing or shortness of breath. 06/18/19  Yes Vallarie Mare M, PA-C  albuterol (VENTOLIN HFA) 108 (90 Base) MCG/ACT inhaler Inhale 2 puffs into the lungs every 6 (six) hours as needed for wheezing or shortness of breath. 06/23/20  Yes Blake Divine, MD  benzonatate (TESSALON) 200 MG capsule Take 1 capsule (200 mg total) by mouth 3 (three) times daily as needed for cough. 10/03/20  Yes Danton Clap, PA-C  buPROPion (WELLBUTRIN XL) 300 MG 24 hr  tablet Take 300 mg by mouth daily. 08/11/19  Yes [provider]  escitalopram (LEXAPRO) 20 MG tablet Take 20 mg by mouth daily.  09/03/17  Yes [provider]  fluticasone (FLONASE) 50 MCG/ACT nasal spray Place 2 sprays into both nostrils daily. 04/02/20  Yes Cook, Jayce G, DO  Fluticasone-Salmeterol (ADVAIR) 500-50 MCG/DOSE AEPB Inhale 1 puff into the lungs 2 (two) times daily.   Yes [provider]  guaiFENesin-codeine (ROBITUSSIN AC) 100-10 MG/5ML syrup Take 10 mLs by mouth 4 (four) times daily as needed for cough. 10/03/20  Yes Laurene Footman B, PA-C  montelukast (SINGULAIR) 10 MG tablet Take 1 tablet (10 mg total) by mouth daily. 07/12/20  Yes Caccavale, Sophia, PA-C  omeprazole (PRILOSEC) 20 MG capsule Take 1 capsule (20 mg total) by mouth 2 (two) times daily before a meal. 03/08/20  Yes Martyn Ehrich, NP  predniSONE (DELTASONE)  10 MG tablet Take 6 tablets p.o. on day 1 and decrease by 1 tablet daily until complete 10/03/20  Yes Laurene Footman B, PA-C  cetirizine (ZYRTEC) 10 MG tablet Take by mouth.    [provider]  fexofenadine-pseudoephedrine (ALLEGRA-D) 60-120 MG 12 hr tablet Take 1 tablet by mouth every 12 (twelve) hours. 07/12/20   Caccavale, Sophia, PA-C  ipratropium-albuterol (DUONEB) 0.5-2.5 (3) MG/3ML SOLN Take 3 mLs by nebulization every 6 (six) hours as needed for up to 20 days (wheezing/shortness of breath.). 12/15/19 01/04/20  Will Bonnet, MD  montelukast (SINGULAIR) 10 MG tablet Take by mouth.    [provider]  ferrous sulfate 325 (65 FE) MG tablet Take 1 tablet (325 mg total) by mouth daily. 10/16/19 04/02/20  Melynda Ripple, MD  ipratropium (ATROVENT) 0.06 % nasal spray Place 2 sprays into both nostrils 4 (four) times daily. 3-4 times/ day 02/24/17 12/08/18  Melynda Ripple, MD  sertraline (ZOLOFT) 50 MG tablet Take 1 tablet (50 mg total) by mouth daily. 01/11/16 12/08/18  Norval Gable, MD  SPIRIVA RESPIMAT 1.25 MCG/ACT AERS TAKE 2 PUFFS BY MOUTH TWICE A DAY 07/16/18 04/02/20  Flora Lipps, MD    Family History Family History  Problem Relation Age of Onset   Asthma Mother    Gout Father    Cancer Maternal Grandmother 28       unknown type   Uterine cancer Neg Hx    Ovarian cancer Neg Hx    Breast cancer Neg Hx     Social History Social History   Tobacco Use   Smoking status: Some Days    Packs/day: 0.50    Pack years: 0.00    Types: Cigarettes    Last attempt to quit: 12/23/2017    Years since quitting: 2.7   Smokeless tobacco: Never   Tobacco comments:    smokes one cigarette occassionally  Vaping Use   Vaping Use: Never used  Substance Use Topics   Alcohol use: Yes    Comment: occasional (once weekly)   Drug use: No     Allergies   Patient has no known allergies.   Review of Systems Review of Systems  Constitutional:  Positive for fatigue. Negative for  chills, diaphoresis and fever.  HENT:  Positive for congestion. Negative for ear pain, rhinorrhea, sinus pressure, sinus pain and sore throat.   Respiratory:  Positive for cough, chest tightness, shortness of breath and wheezing.   Gastrointestinal:  Negative for abdominal pain, nausea and vomiting.  Musculoskeletal:  Negative for arthralgias and myalgias.  Skin:  Negative for rash.  Neurological:  Positive for headaches. Negative for weakness.  Hematological:  Negative for adenopathy.    Physical Exam Triage Vital Signs ED Triage Vitals  Enc Vitals Group     BP 10/03/20 1635 (!) 137/96     Pulse Rate 10/03/20 1635 (!) 105     Resp 10/03/20 1635 18     Temp 10/03/20 1635 98.8 F (37.1 C)     Temp Source 10/03/20 1635 Oral     SpO2 10/03/20 1635 96 %     Weight 10/03/20 1631 260 lb (117.9 kg)     Height 10/03/20 1631 5\' 4"  (1.626 m)     Head Circumference --      Peak Flow --      Pain Score 10/03/20 1631 8     Pain Loc --      Pain Edu? --      Excl. in [place]? --    No data found.  Updated Vital Signs BP (!) 137/96 (BP Location: Left Arm)   Pulse (!) 105   Temp 98.8 F (37.1 C) (Oral)   Resp 18   Ht 5\' 4"  (1.626 m)   Wt 260 lb (117.9 kg)   LMP 10/20/2019 (Approximate)   SpO2 96%   BMI 44.63 kg/m      Physical Exam Vitals and nursing note reviewed.  Constitutional:      General: She is not in acute distress.    Appearance: Normal appearance. She is well-developed. She is obese. She is not ill-appearing or toxic-appearing.  HENT:     Head: Normocephalic and atraumatic.     Nose: Nose normal.     Mouth/Throat:     Mouth: Mucous membranes are moist.     Pharynx: Oropharynx is clear. Posterior oropharyngeal erythema (injection of posterior pharynx) present.  Eyes:     General: No scleral icterus.       Right eye: No discharge.        Left eye: No discharge.     Conjunctiva/sclera: Conjunctivae normal.  Cardiovascular:     Rate and Rhythm: Regular rhythm.  Tachycardia present.     Heart sounds: Normal heart sounds.  Pulmonary:     Effort: Pulmonary effort is normal. No respiratory distress.     Breath sounds: Wheezing and rhonchi present.     Comments: Diffuse wheezing and rhonchi throughout Musculoskeletal:     Cervical back: Neck supple.  Skin:    General: Skin is dry.  Neurological:     General: No focal deficit present.     Mental Status: She is alert. Mental status is at baseline.     Motor: No weakness.     Gait: Gait normal.  Psychiatric:        Mood and Affect: Mood normal.        Behavior: Behavior normal.        Thought Content: Thought content normal.     UC Treatments / Results  Labs (all labs ordered are listed, but only abnormal results are displayed) Labs Reviewed - No data to display  EKG   Radiology DG Chest 2 View  Result Date: 10/03/2020 CLINICAL DATA:  Cough. EXAM: CHEST - 2 VIEW COMPARISON:  June 23, 2020. FINDINGS: The heart size and mediastinal contours are within normal limits. Both lungs are clear. The visualized skeletal structures are unremarkable. IMPRESSION: No active cardiopulmonary disease. Electronically Signed   By: Marijo Conception M.D.   On: 10/03/2020 17:02    Procedures Procedures (including critical care time)  Medications Ordered in UC Medications  dexamethasone (DECADRON) injection 10 mg (10 mg Intramuscular Given 10/03/20 1716)    Initial Impression / Assessment and Plan / UC Course  I have reviewed the triage vital signs and the nursing notes.  Pertinent labs & imaging results that were available during my care of the patient were reviewed by me and considered in my medical decision making (see chart for details).  40 year old female with asthma presenting for approximately 1 month history of productive cough, wheezing, chest tightness and shortness of breath.  Patient is afebrile.  Oxygen is 96%.  She is in no acute distress.  Exam significant for injection of posterior pharynx,  diffuse wheezing and rhonchi throughout chest.  X-ray of chest obtained today to assess for underlying pneumonia given her duration of symptoms.  X-ray independently reviewed by me.  Overread is negative for pneumonia or other acute findings.  Reviewed this with patient.  Suspect viral illness and asthma exacerbation.  Patient given 10 mg IM dexamethasone at her request.  Patient says she has had this before and it has helped her tremendously.  Advised her to start the prednisone taper tomorrow.  Also sent in benzonatate.  Advised increasing rest and fluids.  Printed a prescription for Cheratussin in case the benzonatate is not strong enough.  Patient advised to follow-up with PCP for any continuing or unresolving symptoms.  ED precautions reviewed.  Work note given.   Final Clinical Impressions(s) / UC Diagnoses   Final diagnoses:  Cough  Asthma with acute exacerbation, unspecified asthma severity, unspecified whether persistent  Wheezing     Discharge Instructions      Your x-ray was normal.  No evidence of pneumonia.  I have sent in some prednisone because you are having a flareup of your asthma.  I have also sent in benzonatate to help with your cough.  If this is not strong enough that he can fill the guaifenesin-codeine medication which is stronger.  Start your prednisone tomorrow.  Continue using your inhalers as directed.  Should be seen again if you develop any worsening symptoms, have a fever or increased breathing issue.  For any severe acute symptoms, please go to the ER.  Please follow-up with your PCP if you continue to have the exacerbation.     ED Prescriptions     Medication Sig Dispense Auth. Provider   predniSONE (DELTASONE) 10 MG tablet Take 6 tablets p.o. on day 1 and decrease by 1 tablet daily until complete 21 tablet Laurene Footman B, PA-C   benzonatate (TESSALON) 200 MG capsule Take 1 capsule (200 mg total) by mouth 3 (three) times daily as needed for cough. 30  capsule Danton Clap, PA-C   guaiFENesin-codeine (ROBITUSSIN AC) 100-10 MG/5ML syrup Take 10 mLs by mouth 4 (four) times daily as needed for cough. 120 mL Danton Clap, PA-C      I have reviewed the PDMP during this encounter.   Danton Clap, PA-C 10/03/20 1719

## 2020-10-27 ENCOUNTER — Telehealth: Payer: Self-pay | Admitting: Internal Medicine

## 2020-10-27 DIAGNOSIS — N920 Excessive and frequent menstruation with regular cycle: Secondary | ICD-10-CM

## 2020-10-27 MED ORDER — IPRATROPIUM-ALBUTEROL 0.5-2.5 (3) MG/3ML IN SOLN: 3.0000 mL | Freq: Four times a day (QID) | RESPIRATORY_TRACT | Status: DC | PRN

## 2020-10-27 MED ORDER — PREDNISONE 20 MG PO TABS: 20.0000 mg | ORAL_TABLET | Freq: Every day | ORAL | 0 refills | Status: DC

## 2020-10-27 NOTE — Telephone Encounter (Signed)
Spoke to patient, who reports of throat congestion, mild wheezing and increased sob with exertion and at night. x2w Denied f/c/s or additional sx.  Using Advair BID and albuterol HFA once daily. Requesting refill on duoneb   Dr. Mortimer Fries, please advise on duoneb and recommendations. Thanks

## 2020-10-27 NOTE — Telephone Encounter (Signed)
Patient is aware of recommendations and voiced her understanding.  Rx for duoneb and prednisone has been sent to preferred pharmacy. Nothing further needed at this time.

## 2020-10-27 NOTE — Telephone Encounter (Signed)
Lm x1 for patient.  

## 2020-12-12 ENCOUNTER — Ambulatory Visit (INDEPENDENT_AMBULATORY_CARE_PROVIDER_SITE_OTHER): Payer: Medicaid Other

## 2020-12-12 ENCOUNTER — Other Ambulatory Visit: Payer: Self-pay

## 2020-12-12 ENCOUNTER — Ambulatory Visit
Admission: EM | Admit: 2020-12-12 | Discharge: 2020-12-12 | Disposition: A | Discharge status: Home / Self Care | Payer: Medicaid Other | Attending: Physician Assistant | Admitting: Physician Assistant

## 2020-12-12 DIAGNOSIS — R0600 Dyspnea, unspecified: Secondary | ICD-10-CM

## 2020-12-12 DIAGNOSIS — J4541 Moderate persistent asthma with (acute) exacerbation: Secondary | ICD-10-CM | POA: Diagnosis not present

## 2020-12-12 MED ORDER — PREDNISONE 10 MG (21) PO TBPK: ORAL_TABLET | Freq: Every day | ORAL | 0 refills | Status: DC

## 2020-12-12 MED ORDER — METHYLPREDNISOLONE SODIUM SUCC 40 MG IJ SOLR
40.0000 mg | Freq: Once | INTRAMUSCULAR | Status: AC
  Administered 2020-12-12: 40 mg via INTRAMUSCULAR

## 2020-12-12 NOTE — ED Provider Notes (Signed)
[facility]    CSN: [id number] Arrival date & time: 12/12/20  1228      History   Chief Complaint No chief complaint on file.   HPI Sabrina Holder is a 40 y.o. female who presents with her asthma flaring from going outside and has been doing her neb treatments at home, but she is not getting better. Has not been ill at all. She gets this way sometimes and her PCP has to put her on steroids. Has hd 2 covid tests. She denies feeling ill, having fever, chills, sweats, HA or aches.     Past Medical History:  Diagnosis Date   Anemia    Anxiety    Asthma    Depression    Environmental allergies    GERD (gastroesophageal reflux disease)    Renal disorder     Patient Active Problem List   Diagnosis Date Noted   Menorrhagia with regular cycle 12/15/2019   Anemia due to chronic blood loss 12/15/2019   Class 2 obesity due to excess calories with body mass index (BMI) of 39.0 to 39.9 in adult 11/05/2017   Prediabetes 11/05/2017   Moderate asthma with acute exacerbation 03/29/2017    Past Surgical History:  Procedure Laterality Date   CESAREAN SECTION     X 3   CYSTOSCOPY N/A 12/15/2019   Procedure: CYSTOSCOPY;  Surgeon: Will Bonnet, MD;  Location: [facility];  Service: Gynecology;  Laterality: N/A;   TOTAL LAPAROSCOPIC HYSTERECTOMY WITH SALPINGECTOMY Bilateral 12/15/2019   Procedure: TOTAL LAPAROSCOPIC HYSTERECTOMY WITH SALPINGECTOMY;  Surgeon: Will Bonnet, MD;  Location: [facility];  Service: Gynecology;  Laterality: Bilateral;   TUBAL LIGATION      OB History     Gravida  6   Para  5   Term  5   Preterm      AB  1   Living  5      SAB      IAB  1   Ectopic      Multiple      Live Births  5            Home Medications    Prior to Admission medications   Medication Sig Start Date End Date Taking? Authorizing Provider  albuterol (PROVENTIL) (2.5 MG/3ML) 0.083% nebulizer solution Take 3 mLs (2.5 mg total) by nebulization  every 6 (six) hours as needed for wheezing or shortness of breath. 06/18/19  Yes Vallarie Mare M, PA-C  albuterol (VENTOLIN HFA) 108 (90 Base) MCG/ACT inhaler Inhale 2 puffs into the lungs every 6 (six) hours as needed for wheezing or shortness of breath. 06/23/20  Yes Blake Divine, MD  buPROPion (WELLBUTRIN XL) 300 MG 24 hr tablet Take 300 mg by mouth daily. 08/11/19  Yes [provider]  escitalopram (LEXAPRO) 20 MG tablet Take 20 mg by mouth daily.  09/03/17  Yes [provider]  fexofenadine-pseudoephedrine (ALLEGRA-D) 60-120 MG 12 hr tablet Take 1 tablet by mouth every 12 (twelve) hours. 07/12/20  Yes Caccavale, Sophia, PA-C  fluticasone (FLONASE) 50 MCG/ACT nasal spray Place 2 sprays into both nostrils daily. 04/02/20  Yes Cook, Jayce G, DO  Fluticasone-Salmeterol (ADVAIR) 500-50 MCG/DOSE AEPB Inhale 1 puff into the lungs 2 (two) times daily.   Yes [provider]  ipratropium-albuterol (DUONEB) 0.5-2.5 (3) MG/3ML SOLN Take 3 mLs by nebulization every 6 (six) hours as needed (wheezing/shortness of breath.). 10/27/20 12/12/20 Yes Kasa, Maretta Bees, MD  montelukast (SINGULAIR) 10 MG tablet Take 1  tablet (10 mg total) by mouth daily. 07/12/20  Yes Caccavale, Sophia, PA-C  omeprazole (PRILOSEC) 20 MG capsule Take 1 capsule (20 mg total) by mouth 2 (two) times daily before a meal. 03/08/20  Yes Martyn Ehrich, NP  predniSONE (STERAPRED UNI-PAK 21 TAB) 10 MG (21) TBPK tablet Take by mouth daily. Take 6 tabs by mouth daily  for 2 days, then 5 tabs for 2 days, then 4 tabs for 2 days, then 3 tabs for 2 days, 2 tabs for 2 days, then 1 tab by mouth daily for 2 days 12/12/20  Yes Rodriguez-Southworth, Sunday Spillers, PA-C  cetirizine (ZYRTEC) 10 MG tablet Take by mouth.    [provider]  montelukast (SINGULAIR) 10 MG tablet Take by mouth.    [provider]  ferrous sulfate 325 (65 FE) MG tablet Take 1 tablet (325 mg total) by mouth daily. 10/16/19 04/02/20  Melynda Ripple, MD   ipratropium (ATROVENT) 0.06 % nasal spray Place 2 sprays into both nostrils 4 (four) times daily. 3-4 times/ day 02/24/17 12/08/18  Melynda Ripple, MD  sertraline (ZOLOFT) 50 MG tablet Take 1 tablet (50 mg total) by mouth daily. 01/11/16 12/08/18  Norval Gable, MD  SPIRIVA RESPIMAT 1.25 MCG/ACT AERS TAKE 2 PUFFS BY MOUTH TWICE A DAY 07/16/18 04/02/20  Flora Lipps, MD    Family History Family History  Problem Relation Age of Onset   Asthma Mother    Gout Father    Cancer Maternal Grandmother 31       unknown type   Uterine cancer Neg Hx    Ovarian cancer Neg Hx    Breast cancer Neg Hx     Social History Social History   Tobacco Use   Smoking status: Some Days    Packs/day: 0.50    Types: Cigarettes    Last attempt to quit: 12/23/2017    Years since quitting: 2.9   Smokeless tobacco: Never   Tobacco comments:    smokes one cigarette occassionally  Vaping Use   Vaping Use: Never used  Substance Use Topics   Alcohol use: Yes    Comment: occasional (once weekly)   Drug use: No     Allergies   Patient has no known allergies.   Review of Systems Review of Systems  Constitutional:  Negative for appetite change, chills, diaphoresis and fever.  HENT:  Positive for sneezing. Negative for ear discharge and ear pain.        Has itchy throat   Respiratory:  Positive for cough, chest tightness, shortness of breath and wheezing.   Cardiovascular:  Negative for chest pain.  Musculoskeletal:  Negative for gait problem and myalgias.  Skin:  Negative for rash.  Neurological:  Negative for headaches.    Physical Exam Triage Vital Signs ED Triage Vitals  Enc Vitals Group     BP 12/12/20 1339 (!) 152/90     Pulse Rate 12/12/20 1339 89     Resp 12/12/20 1339 20     Temp 12/12/20 1339 99.4 F (37.4 C)     Temp Source 12/12/20 1339 Oral     SpO2 12/12/20 1339 94 %     Weight 12/12/20 1335 255 lb (115.7 kg)     Height 12/12/20 1335 '5\' 4"'$  (1.626 m)     Head Circumference --       Peak Flow --      Pain Score --      Pain Loc --      Pain Edu? --  Excl. in [place]? --    No data found.  Updated Vital Signs BP (!) 152/90 (BP Location: Left Arm)   Pulse 89   Temp 99.4 F (37.4 C) (Oral)   Resp 20   Ht '5\' 4"'$  (1.626 m)   Wt 255 lb (115.7 kg)   LMP 10/20/2019 (Approximate)   SpO2 94%   BMI 43.77 kg/m  Pulse ox repeated after steroid show and was 96% Visual Acuity Right Eye Distance:   Left Eye Distance:   Bilateral Distance:    Right Eye Near:   Left Eye Near:    Bilateral Near:     Physical Exam Physical Exam Constitutional:      General: He is not in acute distress.    Appearance: He is not toxic-appearing.  HENT:     Head: Normocephalic.     Right Ear: Tympanic membrane, ear canal and external ear normal.     Left Ear: Ear canal and external ear normal.     Nose: Nose normal.     Mouth/Throat:     Mouth: Mucous membranes are moist.     Pharynx: Oropharynx is clear.  Eyes:     General: No scleral icterus.    Conjunctiva/sclera: Conjunctivae normal.  Cardiovascular:     Rate and Rhythm: Normal rate and regular rhythm.     Heart sounds: No murmur heard.   Pulmonary:     Effort: Pulmonary effort is normal. No respiratory distress.     Breath sounds: Wheezing present.     Comments: Has auditory wheezing Musculoskeletal:        General: Normal range of motion.     Cervical back: Neck supple.  Lymphadenopathy:     Cervical: No cervical adenopathy.  Skin:    General: Skin is warm and dry.     Findings: No rash.  Neurological:     Mental Status: He is alert and oriented to person, place, and time.     Gait: Gait normal.  Psychiatric:        Mood and Affect: Mood normal.        Behavior: Behavior normal.        Thought Content: Thought content normal.        Judgment: Judgment normal.    UC Treatments / Results  Labs (all labs ordered are listed, but only abnormal results are displayed) Labs Reviewed - No data to  display  EKG   Radiology DG Chest 2 View  Result Date: 12/12/2020 CLINICAL DATA:  Dyspnea, asthma, negative COVID test EXAM: CHEST - 2 VIEW COMPARISON:  10/03/2020 chest radiograph. FINDINGS: Stable cardiomediastinal silhouette with normal heart size. No pneumothorax. No pleural effusion. Lungs appear clear, with no acute consolidative airspace disease and no pulmonary edema. IMPRESSION: No active cardiopulmonary disease. Electronically Signed   By: Ilona Sorrel M.D.   On: 12/12/2020 14:25    Procedures Procedures (including critical care time)  Medications Ordered in UC Medications  methylPREDNISolone sodium succinate (SOLU-MEDROL) 40 mg/mL injection 40 mg (40 mg Intramuscular Given 12/12/20 1420)    Initial Impression / Assessment and Plan / UC Course  I have reviewed the triage vital signs and the nursing notes. Pertinent imaging results that were available during my care of the patient were reviewed by me and considered in my medical decision making (see chart for details). Asthma exacerbation I placed her on Prednisone taper as noted. Needs to FU with her PCP this week.       Final Clinical Impressions(s) /  UC Diagnoses   Final diagnoses:  Moderate persistent asthma with acute exacerbation     Discharge Instructions      Follow up with your PCP this week     ED Prescriptions     Medication Sig Dispense Auth. Provider   predniSONE (STERAPRED UNI-PAK 21 TAB) 10 MG (21) TBPK tablet Take by mouth daily. Take 6 tabs by mouth daily  for 2 days, then 5 tabs for 2 days, then 4 tabs for 2 days, then 3 tabs for 2 days, 2 tabs for 2 days, then 1 tab by mouth daily for 2 days 42 tablet Rodriguez-Southworth, Sunday Spillers, PA-C      PDMP not reviewed this encounter.   Shelby Mattocks, [place] 12/12/20 1533

## 2020-12-12 NOTE — ED Triage Notes (Signed)
Patient C/O shortness of breath, states asthma is flaring up, has been taking benadryl, took two at home COVID test and came out negative two days and yesterday. -MB

## 2020-12-12 NOTE — Discharge Instructions (Addendum)
Follow-up with your PCP this week.  

## 2020-12-23 ENCOUNTER — Encounter: Payer: Self-pay | Admitting: *Deleted

## 2020-12-26 ENCOUNTER — Ambulatory Visit: Payer: Medicaid Other | Admitting: Registered Nurse

## 2020-12-26 ENCOUNTER — Ambulatory Visit
Admission: RE | Admit: 2020-12-26 | Discharge: 2020-12-26 | Disposition: A | Discharge status: Home / Self Care | Payer: Medicaid Other | Attending: Gastroenterology | Admitting: Gastroenterology

## 2020-12-26 ENCOUNTER — Encounter
Admission: RE | Disposition: A | Discharge status: Home / Self Care | Payer: Self-pay | Source: Home / Self Care | Attending: Gastroenterology

## 2020-12-26 ENCOUNTER — Encounter: Payer: Self-pay | Admitting: *Deleted

## 2020-12-26 DIAGNOSIS — K319 Disease of stomach and duodenum, unspecified: Secondary | ICD-10-CM | POA: Diagnosis not present

## 2020-12-26 DIAGNOSIS — R12 Heartburn: Secondary | ICD-10-CM | POA: Diagnosis present

## 2020-12-26 DIAGNOSIS — Z7951 Long term (current) use of inhaled steroids: Secondary | ICD-10-CM | POA: Diagnosis not present

## 2020-12-26 DIAGNOSIS — K640 First degree hemorrhoids: Secondary | ICD-10-CM | POA: Diagnosis not present

## 2020-12-26 DIAGNOSIS — Z79899 Other long term (current) drug therapy: Secondary | ICD-10-CM | POA: Diagnosis not present

## 2020-12-26 DIAGNOSIS — K921 Melena: Secondary | ICD-10-CM | POA: Insufficient documentation

## 2020-12-26 DIAGNOSIS — K5900 Constipation, unspecified: Secondary | ICD-10-CM | POA: Insufficient documentation

## 2020-12-26 DIAGNOSIS — K5730 Diverticulosis of large intestine without perforation or abscess without bleeding: Secondary | ICD-10-CM | POA: Insufficient documentation

## 2020-12-26 DIAGNOSIS — D120 Benign neoplasm of cecum: Secondary | ICD-10-CM | POA: Diagnosis not present

## 2020-12-26 DIAGNOSIS — K219 Gastro-esophageal reflux disease without esophagitis: Secondary | ICD-10-CM | POA: Diagnosis not present

## 2020-12-26 HISTORY — PX: ESOPHAGOGASTRODUODENOSCOPY: SHX5428

## 2020-12-26 HISTORY — PX: COLONOSCOPY: SHX5424

## 2020-12-26 SURGERY — COLONOSCOPY
Anesthesia: General

## 2020-12-26 MED ORDER — PROPOFOL 10 MG/ML IV BOLUS
INTRAVENOUS | Status: DC | PRN
  Administered 2020-12-26: 30 mg via INTRAVENOUS
  Administered 2020-12-26: 20 mg via INTRAVENOUS
  Administered 2020-12-26: 100 mg via INTRAVENOUS

## 2020-12-26 MED ORDER — GLYCOPYRROLATE 0.2 MG/ML IJ SOLN
INTRAMUSCULAR | Status: DC | PRN
  Administered 2020-12-26: .2 mg via INTRAVENOUS

## 2020-12-26 MED ORDER — ALBUTEROL SULFATE HFA 108 (90 BASE) MCG/ACT IN AERS
INHALATION_SPRAY | RESPIRATORY_TRACT | Status: DC | PRN
  Administered 2020-12-26: 2 via RESPIRATORY_TRACT

## 2020-12-26 MED ORDER — PROPOFOL 500 MG/50ML IV EMUL
INTRAVENOUS | Status: AC
  Filled 2020-12-26: qty 50

## 2020-12-26 MED ORDER — PROPOFOL 500 MG/50ML IV EMUL
INTRAVENOUS | Status: DC | PRN
  Administered 2020-12-26: 150 ug/kg/min via INTRAVENOUS

## 2020-12-26 MED ORDER — ALBUTEROL SULFATE HFA 108 (90 BASE) MCG/ACT IN AERS
INHALATION_SPRAY | RESPIRATORY_TRACT | Status: AC
  Filled 2020-12-26: qty 6.7

## 2020-12-26 MED ORDER — SODIUM CHLORIDE 0.9 % IV SOLN: INTRAVENOUS | Status: DC

## 2020-12-26 MED ORDER — GLYCOPYRROLATE 0.2 MG/ML IJ SOLN
INTRAMUSCULAR | Status: AC
  Filled 2020-12-26: qty 1

## 2020-12-26 MED ORDER — LIDOCAINE HCL (PF) 2 % IJ SOLN
INTRAMUSCULAR | Status: AC
  Filled 2020-12-26: qty 5

## 2020-12-26 MED ORDER — LIDOCAINE HCL (CARDIAC) PF 100 MG/5ML IV SOSY
PREFILLED_SYRINGE | INTRAVENOUS | Status: DC | PRN
  Administered 2020-12-26: 40 mg via INTRAVENOUS

## 2020-12-26 MED ORDER — DEXMEDETOMIDINE (PRECEDEX) IN NS 20 MCG/5ML (4 MCG/ML) IV SYRINGE
PREFILLED_SYRINGE | INTRAVENOUS | Status: DC | PRN
  Administered 2020-12-26 (×2): 12 ug via INTRAVENOUS

## 2020-12-26 NOTE — Anesthesia Postprocedure Evaluation (Signed)
Anesthesia Post Note  Patient: Sabrina Holder  Procedure(s) Performed: COLONOSCOPY ESOPHAGOGASTRODUODENOSCOPY (EGD)  Patient location during evaluation: PACU Anesthesia Type: General Level of consciousness: awake and alert Pain management: pain level controlled Vital Signs Assessment: post-procedure vital signs reviewed and stable Respiratory status: spontaneous breathing, nonlabored ventilation, respiratory function stable and patient connected to nasal cannula oxygen Cardiovascular status: blood pressure returned to baseline and stable Postop Assessment: no apparent nausea or vomiting Anesthetic complications: no   No notable events documented.   Last Vitals:  Vitals:   12/26/20 0938 12/26/20 0948  BP: (!) 138/109 (!) 129/94  Pulse: 91 83  Resp: (!) 22 15  Temp:    SpO2: 100% 100%    Last Pain:  Vitals:   12/26/20 0948  TempSrc:   PainSc: 0-No pain                 Molli Barrows

## 2020-12-26 NOTE — Op Note (Signed)
[facility] Gastroenterology Patient Name: Phelicia Dantes Procedure Date: 12/26/2020 8:18 AM MRN: [id number] Account #: [id number] Date of Birth: 1981-01-23 Admit Type: Outpatient Age: 40 Room: [facility] ENDO ROOM 2 Gender: Female Note Status: Finalized Instrument Name: Upper Endoscope [id number] Procedure:             Upper GI endoscopy Indications:           Heartburn, Suspected esophageal reflux Providers:             Rueben Bash, DO Referring MD:          [facility], dr (Referring MD) Medicines:             Monitored Anesthesia Care Complications:         No immediate complications. Estimated blood loss:                         Minimal. Procedure:             Pre-Anesthesia Assessment:                        - Prior to the procedure, a History and Physical was                         performed, and patient medications and allergies were                         reviewed. The patient is competent. The risks and                         benefits of the procedure and the sedation options and                         risks were discussed with the patient. All questions                         were answered and informed consent was obtained.                         Patient identification and proposed procedure were                         verified by the physician, the nurse, the anesthetist                         and the technician in the endoscopy suite. Mental                         Status Examination: alert and oriented. Airway                         Examination: normal oropharyngeal airway and neck                         mobility. Respiratory Examination: clear to                         auscultation. CV Examination: RRR, no murmurs, no S3  or S4. Prophylactic Antibiotics: The patient does not                         require prophylactic antibiotics. Prior                         Anticoagulants: The patient has taken no  previous                         anticoagulant or antiplatelet agents. ASA Grade                         Assessment: III - A patient with severe systemic                         disease. After reviewing the risks and benefits, the                         patient was deemed in satisfactory condition to                         undergo the procedure. The anesthesia plan was to use                         monitored anesthesia care (MAC). Immediately prior to                         administration of medications, the patient was                         re-assessed for adequacy to receive sedatives. The                         heart rate, respiratory rate, oxygen saturations,                         blood pressure, adequacy of pulmonary ventilation, and                         response to care were monitored throughout the                         procedure. The physical status of the patient was                         re-assessed after the procedure.                        After obtaining informed consent, the endoscope was                         passed under direct vision. Throughout the procedure,                         the patient's blood pressure, pulse, and oxygen                         saturations were monitored continuously. The Endoscope  was introduced through the mouth, and advanced to the                         second part of duodenum. The upper GI endoscopy was                         accomplished without difficulty. The patient tolerated                         the procedure well. Findings:      The duodenal bulb, first portion of the duodenum and second portion of       the duodenum were normal. Estimated blood loss: none.      The entire examined stomach was normal. Biopsies were taken with a cold       forceps for Helicobacter pylori testing. Estimated blood loss was       minimal.      Esophagogastric landmarks were identified: the gastroesophageal  junction       was found at 36 cm from the incisors.      The Z-line was regular and was found 36 cm from the incisors. Estimated       blood loss: none.      Normal mucosa was found in the entire esophagus. Estimated blood loss:       none.      The exam was otherwise without abnormality. Impression:            - Normal duodenal bulb, first portion of the duodenum                         and second portion of the duodenum.                        - Normal stomach. Biopsied.                        - Esophagogastric landmarks identified.                        - Z-line regular, 36 cm from the incisors.                        - Normal mucosa was found in the entire esophagus.                        - The examination was otherwise normal. Recommendation:        - Discharge patient to home.                        - Resume previous diet.                        - Continue present medications.                        - Await pathology results.                        - Consider increasing proton pump inhibitor therapy                        -  Recommend weight loss of 10% for improvement in                         reflux. Avoid alcohol and tobacco products if using.                         Avoid trigger foods.                        - Return to referring physician as previously                         scheduled. Procedure Code(s):     --- Professional ---                        [phone], Esophagogastroduodenoscopy, flexible,                         transoral; with biopsy, single or multiple Diagnosis Code(s):     --- Professional ---                        R12, Heartburn CPT copyright 2019 American Medical Association. All rights reserved. The codes documented in this report are preliminary and upon coder review may  be revised to meet current compliance requirements. Attending Participation:      I personally performed the entire procedure. Volney American, DO Annamaria Helling DO,  DO 12/26/2020 9:20:12 AM This report has been signed electronically. Number of Addenda: 0 Note Initiated On: 12/26/2020 8:18 AM Estimated Blood Loss:  Estimated blood loss was minimal.      [facility]

## 2020-12-26 NOTE — H&P (Signed)
[facility] Pre-Procedure H&P   Patient ID: Sabrina Holder is a 40 y.o. female.  Gastroenterology Provider: Annamaria Helling, DO  Referring Provider: Laurine Blazer, PA PCP: [facility] [place]  Date: 12/26/2020  HPI Sabrina Holder is a 40 y.o. female who presents today for Esophagogastroduodenoscopy and Colonoscopy for reflux and bright red blood per rectum respectively.  Pt has had ongoing reflux but is only currently taking omeprazole 20 mg by mouth as needed. Most symptoms at night when she lays down, however, she eats 30 minutes before bed. No dysphagia/odynophagia. Appetite and weight have been stable. No nausea or vomiting.  Notes constipation is improving- using enemas less. Has noted blood passing on outside of stool and with wiping. She did not use the anusol suppositories. Occasionally will see a clot. CBC in April was wnl.  No diarrhea.  No other acute GI complaints.  Patient denies nausea, vomiting, coffee ground emesis, hematemesis, abdominal pain, diarrhea, melena, fever, chills, dysphagia, odynophagia, jaundice.    Past Medical History:  Diagnosis Date   Anemia    Anxiety    Asthma    Depression    Environmental allergies    GERD (gastroesophageal reflux disease)    Renal disorder     Past Surgical History:  Procedure Laterality Date   ABDOMINAL HYSTERECTOMY     CESAREAN SECTION     X 3   CYSTOSCOPY N/A 12/15/2019   Procedure: CYSTOSCOPY;  Surgeon: Will Bonnet, MD;  Location: [facility] ORS;  Service: Gynecology;  Laterality: N/A;   TOTAL LAPAROSCOPIC HYSTERECTOMY WITH SALPINGECTOMY Bilateral 12/15/2019   Procedure: TOTAL LAPAROSCOPIC HYSTERECTOMY WITH SALPINGECTOMY;  Surgeon: Will Bonnet, MD;  Location: [facility] ORS;  Service: Gynecology;  Laterality: Bilateral;   TUBAL LIGATION      Family History No h/o GI disease or malignancy  Review of Systems  Constitutional:  Negative for activity change,  appetite change, fatigue, fever and unexpected weight change.  HENT:  Negative for trouble swallowing and voice change.   Respiratory:  Negative for shortness of breath and wheezing.   Cardiovascular:  Negative for chest pain and palpitations.  Gastrointestinal:  Positive for blood in stool and constipation. Negative for abdominal distention, abdominal pain, anal bleeding, diarrhea, nausea, rectal pain and vomiting.       Reflux  Musculoskeletal:  Negative for arthralgias and myalgias.  Skin:  Negative for color change and pallor.  Neurological:  Negative for dizziness, syncope and weakness.  Psychiatric/Behavioral:  Negative for agitation and confusion.   All other systems reviewed and are negative.   Medications No current facility-administered medications on file prior to encounter.   Current Outpatient Medications on File Prior to Encounter  Medication Sig Dispense Refill   hydrocortisone (ANUSOL-HC) 25 MG suppository Place 25 mg rectally 2 (two) times daily.     albuterol (PROVENTIL) (2.5 MG/3ML) 0.083% nebulizer solution Take 3 mLs (2.5 mg total) by nebulization every 6 (six) hours as needed for wheezing or shortness of breath. 75 mL 12   albuterol (VENTOLIN HFA) 108 (90 Base) MCG/ACT inhaler Inhale 2 puffs into the lungs every 6 (six) hours as needed for wheezing or shortness of breath. 8 g 1   buPROPion (WELLBUTRIN XL) 300 MG 24 hr tablet Take 300 mg by mouth daily.     cetirizine (ZYRTEC) 10 MG tablet Take by mouth.     escitalopram (LEXAPRO) 20 MG tablet Take 20 mg by mouth daily.   1   fexofenadine-pseudoephedrine (ALLEGRA-D) 60-120 MG  12 hr tablet Take 1 tablet by mouth every 12 (twelve) hours. 30 tablet 0   fluticasone (FLONASE) 50 MCG/ACT nasal spray Place 2 sprays into both nostrils daily. 16 g 0   Fluticasone-Salmeterol (ADVAIR) 500-50 MCG/DOSE AEPB Inhale 1 puff into the lungs 2 (two) times daily.     ipratropium-albuterol (DUONEB) 0.5-2.5 (3) MG/3ML SOLN Take 3 mLs by  nebulization every 6 (six) hours as needed (wheezing/shortness of breath.). 360 mL    montelukast (SINGULAIR) 10 MG tablet Take 1 tablet (10 mg total) by mouth daily. 30 tablet 0   montelukast (SINGULAIR) 10 MG tablet Take by mouth.     omeprazole (PRILOSEC) 20 MG capsule Take 1 capsule (20 mg total) by mouth 2 (two) times daily before a meal. 60 capsule 2   predniSONE (STERAPRED UNI-PAK 21 TAB) 10 MG (21) TBPK tablet Take by mouth daily. Take 6 tabs by mouth daily  for 2 days, then 5 tabs for 2 days, then 4 tabs for 2 days, then 3 tabs for 2 days, 2 tabs for 2 days, then 1 tab by mouth daily for 2 days 42 tablet 0   [DISCONTINUED] ferrous sulfate 325 (65 FE) MG tablet Take 1 tablet (325 mg total) by mouth daily. 30 tablet 0   [DISCONTINUED] ipratropium (ATROVENT) 0.06 % nasal spray Place 2 sprays into both nostrils 4 (four) times daily. 3-4 times/ day 15 mL 0   [DISCONTINUED] sertraline (ZOLOFT) 50 MG tablet Take 1 tablet (50 mg total) by mouth daily. 20 tablet 0   [DISCONTINUED] SPIRIVA RESPIMAT 1.25 MCG/ACT AERS TAKE 2 PUFFS BY MOUTH TWICE A DAY 3 Inhaler 2    Pertinent medications related to GI and procedure were reviewed by me with the patient prior to the procedure  No current facility-administered medications for this encounter.      No Known Allergies Allergies were reviewed by me prior to the procedure  Objective    There were no vitals filed for this visit.   Physical Exam Vitals reviewed.  Constitutional:      General: She is not in acute distress.    Appearance: Normal appearance. She is obese. She is not ill-appearing, toxic-appearing or diaphoretic.  HENT:     Head: Normocephalic and atraumatic.     Nose: Nose normal.     Mouth/Throat:     Mouth: Mucous membranes are moist.     Pharynx: Oropharynx is clear.  Eyes:     General: No scleral icterus.    Extraocular Movements: Extraocular movements intact.  Cardiovascular:     Rate and Rhythm: Normal rate and regular  rhythm.     Heart sounds: Normal heart sounds. No murmur heard.   No friction rub. No gallop.  Pulmonary:     Effort: Pulmonary effort is normal. No respiratory distress.     Breath sounds: Normal breath sounds. No wheezing, rhonchi or rales.  Abdominal:     General: Bowel sounds are normal. There is no distension.     Palpations: Abdomen is soft.     Tenderness: There is no abdominal tenderness. There is no guarding or rebound.  Musculoskeletal:     Cervical back: Neck supple.     Right lower leg: No edema.     Left lower leg: No edema.  Skin:    General: Skin is warm and dry.     Coloration: Skin is not jaundiced or pale.  Neurological:     General: No focal deficit present.     Mental  Status: She is alert and oriented to person, place, and time. Mental status is at baseline.  Psychiatric:        Mood and Affect: Mood normal.        Behavior: Behavior normal.        Thought Content: Thought content normal.        Judgment: Judgment normal.     Assessment:  Sabrina Holder is a 40 y.o. female  who presents today for Esophagogastroduodenoscopy and Colonoscopy for reflux and bright red blood per rectum respectively.  Plan:  Esophagogastroduodenoscopy and Colonoscopy with possible intervention today  Esophagogastroduodenoscopy and colonsocopy with possible biopsy, control of bleeding, polypectomy, and interventions as necessary has been discussed with the patient/patient representative. Informed consent was obtained from the patient/patient representative after explaining the indication, nature, and risks of the procedure including but not limited to death, bleeding, perforation, missed neoplasm/lesions, cardiorespiratory compromise, and reaction to medications. Opportunity for questions was given and appropriate answers were provided. Patient/patient representative has verbalized understanding is amenable to undergoing the procedure.   Annamaria Helling, DO  [facility]  Gastroenterology  Portions of the record may have been created with voice recognition software. Occasional wrong-word or 'sound-a-like' substitutions may have occurred due to the inherent limitations of voice recognition software.  Read the chart carefully and recognize, using context, where substitutions may have occurred.

## 2020-12-26 NOTE — Op Note (Signed)
[facility] Gastroenterology Patient Name: Sabrina Holder Procedure Date: 12/26/2020 8:17 AM MRN: [id number] Account #: [id number] Date of Birth: 01-May-1980 Admit Type: Outpatient Age: 40 Room: [facility] ENDO ROOM 2 Gender: Female Note Status: Finalized Instrument Name: [id number] Procedure:             Colonoscopy Indications:           Hematochezia Providers:             Rueben Bash, DO Referring MD:          [facility], dr (Referring MD) Medicines:             Monitored Anesthesia Care Complications:         No immediate complications. Estimated blood loss:                         Minimal. Procedure:             Pre-Anesthesia Assessment:                        - Prior to the procedure, a History and Physical was                         performed, and patient medications and allergies were                         reviewed. The patient is competent. The risks and                         benefits of the procedure and the sedation options and                         risks were discussed with the patient. All questions                         were answered and informed consent was obtained.                         Patient identification and proposed procedure were                         verified by the physician, the nurse, the anesthetist                         and the technician in the endoscopy suite. Mental                         Status Examination: alert and oriented. Airway                         Examination: normal oropharyngeal airway and neck                         mobility. Respiratory Examination: clear to                         auscultation. CV Examination: RRR, no murmurs, no S3  or S4. Prophylactic Antibiotics: The patient does not                         require prophylactic antibiotics. Prior                         Anticoagulants: The patient has taken no previous                         anticoagulant or  antiplatelet agents. ASA Grade                         Assessment: III - A patient with severe systemic                         disease. After reviewing the risks and benefits, the                         patient was deemed in satisfactory condition to                         undergo the procedure. The anesthesia plan was to use                         monitored anesthesia care (MAC). Immediately prior to                         administration of medications, the patient was                         re-assessed for adequacy to receive sedatives. The                         heart rate, respiratory rate, oxygen saturations,                         blood pressure, adequacy of pulmonary ventilation, and                         response to care were monitored throughout the                         procedure. The physical status of the patient was                         re-assessed after the procedure.                        After obtaining informed consent, the colonoscope was                         passed under direct vision. Throughout the procedure,                         the patient's blood pressure, pulse, and oxygen                         saturations were monitored continuously. The  Colonoscope was introduced through the anus and                         advanced to the the terminal ileum, with                         identification of the appendiceal orifice and IC                         valve. The colonoscopy was performed without                         difficulty. The patient tolerated the procedure well.                         The quality of the bowel preparation was evaluated                         using the BBPS [facility] Bowel Preparation Scale) with                         scores of: Right Colon = 2 (minor amount of residual                         staining, small fragments of stool and/or opaque                         liquid, but mucosa seen well),  Transverse Colon = 3                         (entire mucosa seen well with no residual staining,                         small fragments of stool or opaque liquid) and Left                         Colon = 3 (entire mucosa seen well with no residual                         staining, small fragments of stool or opaque liquid).                         The total BBPS score equals 8. The quality of the                         bowel preparation was excellent. The terminal ileum,                         ileocecal valve, appendiceal orifice, and rectum were                         photographed. Findings:      The perianal and digital rectal examinations were normal. Pertinent       negatives include normal sphincter tone.      The terminal ileum appeared normal. Estimated blood loss: none.      A 2 to 3 mm polyp was found in the  cecum. The polyp was sessile. The       polyp was removed with a cold biopsy forceps. Resection and retrieval       were complete. Estimated blood loss was minimal.      Many small-mouthed diverticula were found in the entire colon. Estimated       blood loss: none.      Non-bleeding internal hemorrhoids were found during retroflexion. The       hemorrhoids were Grade I (internal hemorrhoids that do not prolapse).       Estimated blood loss: none.      The exam was otherwise without abnormality on direct and retroflexion       views. Impression:            - The examined portion of the ileum was normal.                        - One 2 to 3 mm polyp in the cecum, removed with a                         cold biopsy forceps. Resected and retrieved.                        - Diverticulosis in the entire examined colon.                        - Non-bleeding internal hemorrhoids.                        - The examination was otherwise normal on direct and                         retroflexion views. Recommendation:        - Discharge patient to home.                        -  Resume previous diet.                        - Continue present medications.                        - Await pathology results.                        - Repeat colonoscopy for surveillance based on                         pathology results.                        - Return to referring physician as previously                         scheduled. Procedure Code(s):     --- Professional ---                        [phone], Colonoscopy, flexible; with biopsy, single or                         multiple Diagnosis Code(s):     ---  Professional ---                        K64.0, First degree hemorrhoids                        K63.5, Polyp of colon                        K92.1, Melena (includes Hematochezia)                        K57.30, Diverticulosis of large intestine without                         perforation or abscess without bleeding CPT copyright 2019 American Medical Association. All rights reserved. The codes documented in this report are preliminary and upon coder review may  be revised to meet current compliance requirements. Attending Participation:      I personally performed the entire procedure. Volney American, DO Annamaria Helling DO, DO 12/26/2020 9:24:55 AM This report has been signed electronically. Number of Addenda: 0 Note Initiated On: 12/26/2020 8:17 AM Scope Withdrawal Time: 0 hours 12 minutes 56 seconds  Total Procedure Duration: 0 hours 20 minutes 22 seconds  Estimated Blood Loss:  Estimated blood loss was minimal.      [facility]

## 2020-12-26 NOTE — Anesthesia Procedure Notes (Signed)
Date/Time: 12/26/2020 8:45 AM Performed by: Doreen Salvage, CRNA Pre-anesthesia Checklist: Patient identified, Emergency Drugs available, Suction available and Patient being monitored Patient Re-evaluated:Patient Re-evaluated prior to induction Oxygen Delivery Method: Supernova nasal CPAP Induction Type: IV induction Dental Injury: Teeth and Oropharynx as per pre-operative assessment  Comments: Nasal cannula with etCO2 monitoring

## 2020-12-26 NOTE — Transfer of Care (Signed)
Immediate Anesthesia Transfer of Care Note  Patient: Sabrina Holder  Procedure(s) Performed: COLONOSCOPY ESOPHAGOGASTRODUODENOSCOPY (EGD)  Patient Location: PACU and Endoscopy Unit  Anesthesia Type:General  Level of Consciousness: drowsy  Airway & Oxygen Therapy: Patient Spontanous Breathing  Post-op Assessment: Report given to RN and Post -op Vital signs reviewed and stable  Post vital signs: Reviewed and stable  Last Vitals:  Vitals Value Taken Time  BP 139/77 12/26/20 0918  Temp 35.6 C 12/26/20 0918  Pulse 109 12/26/20 0919  Resp 26 12/26/20 0919  SpO2 100 % 12/26/20 0919  Vitals shown include unvalidated device data.  Last Pain:  Vitals:   12/26/20 0918  TempSrc: Tympanic  PainSc: Asleep         Complications: No notable events documented.

## 2020-12-26 NOTE — Anesthesia Preprocedure Evaluation (Signed)
Anesthesia Evaluation  Patient identified by MRN, date of birth, ID band Patient awake    Reviewed: Allergy & Precautions, H&P , NPO status , Patient's Chart, lab work & pertinent test results, reviewed documented beta blocker date and time   Airway Mallampati: II   Neck ROM: full    Dental  (+) Teeth Intact   Pulmonary neg shortness of breath, asthma , Current Smoker and Patient abstained from smoking.,    Pulmonary exam normal        Cardiovascular Exercise Tolerance: Good negative cardio ROS Normal cardiovascular exam Rhythm:regular Rate:Normal     Neuro/Psych PSYCHIATRIC DISORDERS Anxiety Depression negative neurological ROS     GI/Hepatic Neg liver ROS, GERD  Medicated,  Endo/Other  Morbid obesity  Renal/GU Renal disease  negative genitourinary   Musculoskeletal   Abdominal   Peds  Hematology  (+) Blood dyscrasia, anemia ,   Anesthesia Other Findings Past Medical History: No date: Anemia No date: Anxiety No date: Asthma No date: Depression No date: Environmental allergies No date: GERD (gastroesophageal reflux disease) No date: Renal disorder Past Surgical History: No date: ABDOMINAL HYSTERECTOMY No date: CESAREAN SECTION     Comment:  X 3 12/15/2019: CYSTOSCOPY; N/A     Comment:  Procedure: CYSTOSCOPY;  Surgeon: Will Bonnet, MD;              Location: [facility];  Service: Gynecology;  Laterality:               N/A; 12/15/2019: TOTAL LAPAROSCOPIC HYSTERECTOMY WITH SALPINGECTOMY;  Bilateral     Comment:  Procedure: TOTAL LAPAROSCOPIC HYSTERECTOMY WITH               SALPINGECTOMY;  Surgeon: Will Bonnet, MD;                Location: [facility];  Service: Gynecology;  Laterality:               Bilateral; No date: TUBAL LIGATION   Reproductive/Obstetrics negative OB ROS                             Anesthesia Physical Anesthesia Plan  ASA: 3  Anesthesia Plan:  General   Post-op Pain Management:    Induction:   PONV Risk Score and Plan:   Airway Management Planned:   Additional Equipment:   Intra-op Plan:   Post-operative Plan:   Informed Consent: I have reviewed the patients History and Physical, chart, labs and discussed the procedure including the risks, benefits and alternatives for the proposed anesthesia with the patient or authorized representative who has indicated his/her understanding and acceptance.     Dental Advisory Given  Plan Discussed with: CRNA  Anesthesia Plan Comments:         Anesthesia Quick Evaluation

## 2020-12-26 NOTE — Interval H&P Note (Signed)
History and Physical Interval Note: Preprocedure H&P from 12/26/20  was reviewed and there was no interval change after seeing and examining the patient.  Written consent was obtained from the patient after discussion of risks, benefits, and alternatives. Patient has consented to proceed with Esophagogastroduodenoscopy and Colonoscopy with possible intervention   12/26/2020 8:33 AM  Sabrina Holder  has presented today for surgery, with the diagnosis of RECTAL BLEEDING,GERD.  The various methods of treatment have been discussed with the patient and family. After consideration of risks, benefits and other options for treatment, the patient has consented to  Procedure(s): COLONOSCOPY (N/A) ESOPHAGOGASTRODUODENOSCOPY (EGD) (N/A) as a surgical intervention.  The patient's history has been reviewed, patient examined, no change in status, stable for surgery.  I have reviewed the patient's chart and labs.  Questions were answered to the patient's satisfaction.     Annamaria Helling

## 2020-12-27 ENCOUNTER — Encounter: Payer: Self-pay | Admitting: Gastroenterology

## 2020-12-27 LAB — SURGICAL PATHOLOGY

## 2021-03-27 ENCOUNTER — Ambulatory Visit
Admission: EM | Admit: 2021-03-27 | Discharge: 2021-03-27 | Disposition: A | Discharge status: Home / Self Care | Payer: Medicaid Other | Attending: Emergency Medicine | Admitting: Emergency Medicine

## 2021-03-27 ENCOUNTER — Other Ambulatory Visit: Payer: Self-pay

## 2021-03-27 DIAGNOSIS — J069 Acute upper respiratory infection, unspecified: Secondary | ICD-10-CM | POA: Diagnosis not present

## 2021-03-27 DIAGNOSIS — J209 Acute bronchitis, unspecified: Secondary | ICD-10-CM

## 2021-03-27 MED ORDER — PREDNISONE 20 MG PO TABS: 60.0000 mg | ORAL_TABLET | Freq: Every day | ORAL | 0 refills | Status: AC

## 2021-03-27 MED ORDER — IPRATROPIUM BROMIDE 0.06 % NA SOLN: 2.0000 | Freq: Four times a day (QID) | NASAL | 12 refills | Status: DC

## 2021-03-27 MED ORDER — PROMETHAZINE-PHENYLEPHRINE 6.25-5 MG/5ML PO SYRP: 5.0000 mL | ORAL_SOLUTION | Freq: Four times a day (QID) | ORAL | 0 refills | Status: DC | PRN

## 2021-03-27 MED ORDER — BENZONATATE 100 MG PO CAPS: 200.0000 mg | ORAL_CAPSULE | Freq: Three times a day (TID) | ORAL | 0 refills | Status: DC

## 2021-03-27 NOTE — ED Triage Notes (Signed)
Patient presents to Urgent Care with complaints of wheezing, itchy eyes, and SOB x 1 week. Pt states treating her wheezing with inhalers and neb treatments with no improvement. Also taking her allegra.   Denies fever.

## 2021-03-27 NOTE — Discharge Instructions (Signed)
Use the albuterol inhaler/nebulizer every 4-6 hours as needed for shortness of breath, wheezing, and cough.  Take the prednisone according to the package instructions to help with pulmonary inflammation.  Use the Tessalon Perles every 8 hours for your cough.  Taken with a small sip of water.  They may give you some numbness to the base of your tongue or metallic taste in her mouth, this is normal.  They are designed to calm down the cough reflex.  Use the Promethazine VC cough syrup at bedtime as will make you drowsy.  You may take 1 teaspoon (5 mL) every 6 hours.  Return for reevaluation for new or worsening symptoms.

## 2021-03-27 NOTE — ED Provider Notes (Signed)
[facility]    CSN: [id number] Arrival date & time: 03/27/21  1018      History   Chief Complaint Chief Complaint  Patient presents with   Shortness of Breath   Wheezing    HPI Sabrina Holder is a 40 y.o. female.   HPI  40 year old female here for evaluation of respiratory complaints.  Patient reports that for the last week she has been experiencing itchy watery eyes, shortness of breath, and wheezing that has not been responding to her Allegra, Advair, and albuterol.  She is also experiencing some nasal congestion, itching in both ears, and cough that is intermittently productive for a clear to white sputum.  The patient denies any new foods, pets, or exposure to her typical allergens.  She is not experiencing any nasal discharge, sore throat, or ear pain.  Past Medical History:  Diagnosis Date   Anemia    Anxiety    Asthma    Depression    Environmental allergies    GERD (gastroesophageal reflux disease)    Renal disorder     Patient Active Problem List   Diagnosis Date Noted   Menorrhagia with regular cycle 12/15/2019   Anemia due to chronic blood loss 12/15/2019   Class 2 obesity due to excess calories with body mass index (BMI) of 39.0 to 39.9 in adult 11/05/2017   Prediabetes 11/05/2017   Moderate asthma with acute exacerbation 03/29/2017    Past Surgical History:  Procedure Laterality Date   ABDOMINAL HYSTERECTOMY     CESAREAN SECTION     X 3   COLONOSCOPY N/A 12/26/2020   Procedure: COLONOSCOPY;  Surgeon: Annamaria Helling, DO;  Location: [facility];  Service: Gastroenterology;  Laterality: N/A;   CYSTOSCOPY N/A 12/15/2019   Procedure: CYSTOSCOPY;  Surgeon: Will Bonnet, MD;  Location: [facility];  Service: Gynecology;  Laterality: N/A;   ESOPHAGOGASTRODUODENOSCOPY N/A 12/26/2020   Procedure: ESOPHAGOGASTRODUODENOSCOPY (EGD);  Surgeon: Annamaria Helling, DO;  Location: [facility];  Service: Gastroenterology;   Laterality: N/A;   TOTAL LAPAROSCOPIC HYSTERECTOMY WITH SALPINGECTOMY Bilateral 12/15/2019   Procedure: TOTAL LAPAROSCOPIC HYSTERECTOMY WITH SALPINGECTOMY;  Surgeon: Will Bonnet, MD;  Location: [facility];  Service: Gynecology;  Laterality: Bilateral;   TUBAL LIGATION      OB History     Gravida  6   Para  5   Term  5   Preterm      AB  1   Living  5      SAB      IAB  1   Ectopic      Multiple      Live Births  5            Home Medications    Prior to Admission medications   Medication Sig Start Date End Date Taking? Authorizing Provider  benzonatate (TESSALON) 100 MG capsule Take 2 capsules (200 mg total) by mouth every 8 (eight) hours. 03/27/21  Yes Margarette Canada, NP  ipratropium (ATROVENT) 0.06 % nasal spray Place 2 sprays into both nostrils 4 (four) times daily. 03/27/21  Yes Margarette Canada, NP  predniSONE (DELTASONE) 20 MG tablet Take 3 tablets (60 mg total) by mouth daily with breakfast for 5 days. 3 tablets daily for 5 days. 03/27/21 04/01/21 Yes Margarette Canada, NP  promethazine-phenylephrine (PROMETHAZINE VC) 6.25-5 MG/5ML SYRP Take 5 mLs by mouth every 6 (six) hours as needed for congestion. 03/27/21  Yes Margarette Canada, NP  albuterol (PROVENTIL) (2.5 MG/3ML) 0.083%  nebulizer solution Take 3 mLs (2.5 mg total) by nebulization every 6 (six) hours as needed for wheezing or shortness of breath. 06/18/19   Lannie Fields, PA-C  albuterol (VENTOLIN HFA) 108 (90 Base) MCG/ACT inhaler Inhale 2 puffs into the lungs every 6 (six) hours as needed for wheezing or shortness of breath. 06/23/20   Blake Divine, MD  buPROPion (WELLBUTRIN XL) 300 MG 24 hr tablet Take 300 mg by mouth daily. 08/11/19   [provider]  cetirizine (ZYRTEC) 10 MG tablet Take by mouth.    [provider]  escitalopram (LEXAPRO) 20 MG tablet Take 20 mg by mouth daily.  09/03/17   [provider]  fexofenadine-pseudoephedrine (ALLEGRA-D) 60-120 MG 12 hr tablet Take 1  tablet by mouth every 12 (twelve) hours. 07/12/20   Caccavale, Sophia, PA-C  fluticasone (FLONASE) 50 MCG/ACT nasal spray Place 2 sprays into both nostrils daily. 04/02/20   Coral Spikes, DO  Fluticasone-Salmeterol (ADVAIR) 500-50 MCG/DOSE AEPB Inhale 1 puff into the lungs 2 (two) times daily.    [provider]  hydrocortisone (ANUSOL-HC) 25 MG suppository Place 25 mg rectally 2 (two) times daily.    [provider]  ipratropium-albuterol (DUONEB) 0.5-2.5 (3) MG/3ML SOLN Take 3 mLs by nebulization every 6 (six) hours as needed (wheezing/shortness of breath.). 10/27/20 12/12/20  Flora Lipps, MD  montelukast (SINGULAIR) 10 MG tablet Take 1 tablet (10 mg total) by mouth daily. 07/12/20   Caccavale, Sophia, PA-C  montelukast (SINGULAIR) 10 MG tablet Take by mouth.    [provider]  omeprazole (PRILOSEC) 20 MG capsule Take 1 capsule (20 mg total) by mouth 2 (two) times daily before a meal. 03/08/20   Martyn Ehrich, NP  ferrous sulfate 325 (65 FE) MG tablet Take 1 tablet (325 mg total) by mouth daily. 10/16/19 04/02/20  Melynda Ripple, MD  sertraline (ZOLOFT) 50 MG tablet Take 1 tablet (50 mg total) by mouth daily. 01/11/16 12/08/18  Norval Gable, MD  SPIRIVA RESPIMAT 1.25 MCG/ACT AERS TAKE 2 PUFFS BY MOUTH TWICE A DAY 07/16/18 04/02/20  Flora Lipps, MD    Family History Family History  Problem Relation Age of Onset   Asthma Mother    Gout Father    Cancer Maternal Grandmother 53       unknown type   Uterine cancer Neg Hx    Ovarian cancer Neg Hx    Breast cancer Neg Hx     Social History Social History   Tobacco Use   Smoking status: Some Days    Packs/day: 0.50    Types: Cigarettes    Last attempt to quit: 12/23/2017    Years since quitting: 3.2   Smokeless tobacco: Never   Tobacco comments:    smokes one cigarette occassionally  Vaping Use   Vaping Use: Never used  Substance Use Topics   Alcohol use: Yes    Comment: occasional (once weekly)   Drug  use: No     Allergies   Patient has no known allergies.   Review of Systems Review of Systems  Constitutional:  Negative for activity change, appetite change, chills and fever.  HENT:  Positive for congestion. Negative for ear discharge, ear pain, rhinorrhea and sore throat.   Eyes:  Positive for discharge and itching.  Respiratory:  Positive for cough, shortness of breath and wheezing.   Gastrointestinal:  Negative for diarrhea, nausea and vomiting.  Skin:  Negative for rash.  Hematological: Negative.   Psychiatric/Behavioral: Negative.  Physical Exam Triage Vital Signs ED Triage Vitals  Enc Vitals Group     BP 03/27/21 1029 (!) 158/106     Pulse Rate 03/27/21 1029 90     Resp 03/27/21 1029 20     Temp 03/27/21 1029 98.7 F (37.1 C)     Temp Source 03/27/21 1029 Oral     SpO2 03/27/21 1029 96 %     Weight --      Height --      Head Circumference --      Peak Flow --      Pain Score 03/27/21 1028 0     Pain Loc --      Pain Edu? --      Excl. in [place]? --    No data found.  Updated Vital Signs BP (!) 158/106 (BP Location: Left Arm)    Pulse 90    Temp 98.7 F (37.1 C) (Oral)    Resp 20    LMP 10/20/2019 (Approximate)    SpO2 96%   Visual Acuity Right Eye Distance:   Left Eye Distance:   Bilateral Distance:    Right Eye Near:   Left Eye Near:    Bilateral Near:     Physical Exam Vitals and nursing note reviewed.  Constitutional:      General: She is not in acute distress.    Appearance: Normal appearance. She is normal weight. She is not ill-appearing.  HENT:     Head: Normocephalic and atraumatic.     Right Ear: Tympanic membrane, ear canal and external ear normal. There is no impacted cerumen.     Left Ear: Tympanic membrane, ear canal and external ear normal. There is no impacted cerumen.     Nose: Congestion and rhinorrhea present.     Mouth/Throat:     Mouth: Mucous membranes are moist.     Pharynx: Oropharynx is clear. No posterior  oropharyngeal erythema.  Cardiovascular:     Rate and Rhythm: Normal rate and regular rhythm.     Pulses: Normal pulses.     Heart sounds: Normal heart sounds. No murmur heard.   No gallop.  Pulmonary:     Effort: Pulmonary effort is normal.     Breath sounds: Wheezing and rhonchi present. No rales.  Musculoskeletal:     Cervical back: Normal range of motion and neck supple.  Lymphadenopathy:     Cervical: No cervical adenopathy.  Skin:    General: Skin is warm and dry.     Capillary Refill: Capillary refill takes less than 2 seconds.     Findings: No erythema or rash.  Neurological:     General: No focal deficit present.     Mental Status: She is alert and oriented to person, place, and time.  Psychiatric:        Mood and Affect: Mood normal.        Behavior: Behavior normal.        Thought Content: Thought content normal.        Judgment: Judgment normal.     UC Treatments / Results  Labs (all labs ordered are listed, but only abnormal results are displayed) Labs Reviewed - No data to display  EKG   Radiology No results found.  Procedures Procedures (including critical care time)  Medications Ordered in UC Medications - No data to display  Initial Impression / Assessment and Plan / UC Course  I have reviewed the triage vital signs and the nursing notes.  Pertinent labs & imaging results that were available during my care of the patient were reviewed by me and considered in my medical decision making (see chart for details).  Patient is a nontoxic-appearing 40 year old female here for evaluation of respiratory complaints as outlined in the HPI above.  On physical exam patient is pearly gray tympanic membranes bilaterally with normal light reflex and clear external auditory canals.  Bulbar and labral conjunctiva are noninjected but there is mild clear discharge in the inner canthus of both eyes.  Nasal mucosa is erythematous and edematous with clear nasal discharge  in both nares.  Oropharyngeal exam is benign.  No cervical lymphadenopathy appreciated on exam.  Cardiopulmonary exam reveals wheezes and rhonchi in the middle and upper lobes bilaterally.  No rales noted.  Patient ports that she has been treating her respiratory and allergy symptoms with her prescribed inhalers and antihistamines without any improvement.  Patient's exam is consistent with an upper respiratory infection and bronchitis.  I will give her Atrovent nasal spray to help with the nasal congestion, Tessalon Perles and Promethazine VC cough syrup help with cough and congestion, and we will start her on a 5-day burst dose of prednisone 60 mg to help with respiratory inflammation.   Final Clinical Impressions(s) / UC Diagnoses   Final diagnoses:  Acute bronchitis, unspecified organism  Upper respiratory tract infection, unspecified type     Discharge Instructions      Use the albuterol inhaler/nebulizer every 4-6 hours as needed for shortness of breath, wheezing, and cough.  Take the prednisone according to the package instructions to help with pulmonary inflammation.  Use the Tessalon Perles every 8 hours for your cough.  Taken with a small sip of water.  They may give you some numbness to the base of your tongue or metallic taste in her mouth, this is normal.  They are designed to calm down the cough reflex.  Use the Promethazine VC cough syrup at bedtime as will make you drowsy.  You may take 1 teaspoon (5 mL) every 6 hours.  Return for reevaluation for new or worsening symptoms.      ED Prescriptions     Medication Sig Dispense Auth. Provider   predniSONE (DELTASONE) 20 MG tablet Take 3 tablets (60 mg total) by mouth daily with breakfast for 5 days. 3 tablets daily for 5 days. 15 tablet Margarette Canada, NP   benzonatate (TESSALON) 100 MG capsule Take 2 capsules (200 mg total) by mouth every 8 (eight) hours. 21 capsule Margarette Canada, NP   promethazine-phenylephrine (PROMETHAZINE  VC) 6.25-5 MG/5ML SYRP Take 5 mLs by mouth every 6 (six) hours as needed for congestion. 180 mL Margarette Canada, NP   ipratropium (ATROVENT) 0.06 % nasal spray Place 2 sprays into both nostrils 4 (four) times daily. 15 mL Margarette Canada, NP      PDMP not reviewed this encounter.   Margarette Canada, NP 03/27/21 1105

## 2021-04-09 ENCOUNTER — Telehealth: Payer: Medicaid Other | Admitting: Emergency Medicine

## 2021-04-09 DIAGNOSIS — J4521 Mild intermittent asthma with (acute) exacerbation: Secondary | ICD-10-CM | POA: Diagnosis not present

## 2021-04-09 MED ORDER — ALBUTEROL SULFATE HFA 108 (90 BASE) MCG/ACT IN AERS: 1.0000 | INHALATION_SPRAY | Freq: Four times a day (QID) | RESPIRATORY_TRACT | 0 refills | Status: DC | PRN

## 2021-04-09 MED ORDER — PREDNISONE 10 MG (21) PO TBPK: ORAL_TABLET | Freq: Every day | ORAL | 0 refills | Status: DC

## 2021-04-09 MED ORDER — ALBUTEROL SULFATE (2.5 MG/3ML) 0.083% IN NEBU: 2.5000 mg | INHALATION_SOLUTION | Freq: Four times a day (QID) | RESPIRATORY_TRACT | 0 refills | Status: DC | PRN

## 2021-04-09 NOTE — Progress Notes (Signed)
Virtual Visit Consent   Sabrina Holder, you are scheduled for a virtual visit with a [facility] provider today.     Just as with appointments in the office, your consent must be obtained to participate.  Your consent will be active for this visit and any virtual visit you may have with one of our providers in the next 365 days.     If you have a MyChart account, a copy of this consent can be sent to you electronically.  All virtual visits are billed to your insurance company just like a traditional visit in the office.    As this is a virtual visit, video technology does not allow for your provider to perform a traditional examination.  This may limit your provider's ability to fully assess your condition.  If your provider identifies any concerns that need to be evaluated in person or the need to arrange testing (such as labs, EKG, etc.), we will make arrangements to do so.     Although advances in technology are sophisticated, we cannot ensure that it will always work on either your end or our end.  If the connection with a video visit is poor, the visit may have to be switched to a telephone visit.  With either a video or telephone visit, we are not always able to ensure that we have a secure connection.     I need to obtain your verbal consent now.   Are you willing to proceed with your visit today? Yes   Sabrina Holder has provided verbal consent on 04/09/2021 for a virtual visit (video or telephone).   Sabrina Holder, [place]   Date: 04/09/2021 9:34 AM   Virtual Visit via Video Note   I, Sabrina Holder, connected with  Sabrina Holder  ([id number], [birth year]) on 04/09/21 at 10:30 AM EST by a video-enabled telemedicine application and verified that I am speaking with the correct person using two identifiers.  Location: Patient: Virtual Visit Location Patient: Home Provider: Virtual Visit Location Provider: Home Office   I discussed the limitations of evaluation and  management by telemedicine and the availability of in person appointments. The patient expressed understanding and agreed to proceed.    History of Present Illness: Sabrina Holder is a 41 y.o. who identifies as a female who was assigned female at birth, and is being seen today for SOB, cough, wheezing, and chest tightness x few days.  Symptoms got worse yesterday after going outside.  Denies sick contacts.  Had negative covid test at home.  Ran out of inhaler.  Reports previous symptoms in the past with asthma flare.  Reports congestion and sore throat.  Denies fever, rhinorrhea, chest pain, vomiting, diarrhea.    HPI: HPI  Problems:  Patient Active Problem List   Diagnosis Date Noted   Menorrhagia with regular cycle 12/15/2019   Anemia due to chronic blood loss 12/15/2019   Class 2 obesity due to excess calories with body mass index (BMI) of 39.0 to 39.9 in adult 11/05/2017   Prediabetes 11/05/2017   Moderate asthma with acute exacerbation 03/29/2017    Allergies: No Known Allergies Medications:  Current Outpatient Medications:    albuterol (VENTOLIN HFA) 108 (90 Base) MCG/ACT inhaler, Inhale 1-2 puffs into the lungs every 6 (six) hours as needed for wheezing or shortness of breath., Disp: 18 g, Rfl: 0   predniSONE (STERAPRED UNI-PAK 21 TAB) 10 MG (21) TBPK tablet, Take by mouth daily. Take 6 tabs by mouth daily  for 2 days, then 5 tabs for 2 days, then 4 tabs for 2 days, then 3 tabs for 2 days, 2 tabs for 2 days, then 1 tab by mouth daily for 2 days, Disp: 42 tablet, Rfl: 0   albuterol (PROVENTIL) (2.5 MG/3ML) 0.083% nebulizer solution, Take 3 mLs (2.5 mg total) by nebulization every 6 (six) hours as needed for wheezing or shortness of breath., Disp: 75 mL, Rfl: 0   benzonatate (TESSALON) 100 MG capsule, Take 2 capsules (200 mg total) by mouth every 8 (eight) hours., Disp: 21 capsule, Rfl: 0   buPROPion (WELLBUTRIN XL) 300 MG 24 hr tablet, Take 300 mg by mouth daily., Disp: , Rfl:     cetirizine (ZYRTEC) 10 MG tablet, Take by mouth., Disp: , Rfl:    escitalopram (LEXAPRO) 20 MG tablet, Take 20 mg by mouth daily. , Disp: , Rfl: 1   fexofenadine-pseudoephedrine (ALLEGRA-D) 60-120 MG 12 hr tablet, Take 1 tablet by mouth every 12 (twelve) hours., Disp: 30 tablet, Rfl: 0   fluticasone (FLONASE) 50 MCG/ACT nasal spray, Place 2 sprays into both nostrils daily., Disp: 16 g, Rfl: 0   Fluticasone-Salmeterol (ADVAIR) 500-50 MCG/DOSE AEPB, Inhale 1 puff into the lungs 2 (two) times daily., Disp: , Rfl:    hydrocortisone (ANUSOL-HC) 25 MG suppository, Place 25 mg rectally 2 (two) times daily., Disp: , Rfl:    ipratropium (ATROVENT) 0.06 % nasal spray, Place 2 sprays into both nostrils 4 (four) times daily., Disp: 15 mL, Rfl: 12   ipratropium-albuterol (DUONEB) 0.5-2.5 (3) MG/3ML SOLN, Take 3 mLs by nebulization every 6 (six) hours as needed (wheezing/shortness of breath.)., Disp: 360 mL, Rfl:    montelukast (SINGULAIR) 10 MG tablet, Take 1 tablet (10 mg total) by mouth daily., Disp: 30 tablet, Rfl: 0   montelukast (SINGULAIR) 10 MG tablet, Take by mouth., Disp: , Rfl:    omeprazole (PRILOSEC) 20 MG capsule, Take 1 capsule (20 mg total) by mouth 2 (two) times daily before a meal., Disp: 60 capsule, Rfl: 2   promethazine-phenylephrine (PROMETHAZINE VC) 6.25-5 MG/5ML SYRP, Take 5 mLs by mouth every 6 (six) hours as needed for congestion., Disp: 180 mL, Rfl: 0  Observations/Objective: Patient is well-developed, well-nourished in no acute distress.  Resting comfortably at home. Nontoxic Head is normocephalic, atraumatic.  No labored breathing. Speaking in full sentences, and tolerating own secretion Speech is clear and coherent with logical content.  Patient is alert and oriented at baseline.    Assessment and Plan: 1. Mild intermittent asthma with exacerbation  Albuterol inhaler and nebulizer solution prescribed Prednisone taper prescribed Take steroid as prescribed and to  completion Follow up with PCP next week for recheck and to ensure symptoms are improving Follow up in person at urgent care or go to ER if you have any new or worsening symptoms such as shortness of breath, difficulty breathing, accessory muscle use, rib retraction, or if symptoms do not improve with medication   Follow Up Instructions: I discussed the assessment and treatment plan with the patient. The patient was provided an opportunity to ask questions and all were answered. The patient agreed with the plan and demonstrated an understanding of the instructions.  A copy of instructions were sent to the patient via MyChart unless otherwise noted below.    The patient was advised to call back or seek an in-person evaluation if the symptoms worsen or if the condition fails to improve as anticipated.  Time:  I spent 5-10 minutes with the patient via telehealth  technology discussing the above problems/concerns.    Sabrina Box, PA-C

## 2021-04-09 NOTE — Patient Instructions (Signed)
Sabrina Holder, thank you for joining Lestine Box, PA-C for today's virtual visit.  While this provider is not your primary care provider (PCP), if your PCP is located in our provider database this encounter information will be shared with them immediately following your visit.  Consent: (Patient) Sabrina Holder provided verbal consent for this virtual visit at the beginning of the encounter.  Current Medications:  Current Outpatient Medications:    albuterol (VENTOLIN HFA) 108 (90 Base) MCG/ACT inhaler, Inhale 1-2 puffs into the lungs every 6 (six) hours as needed for wheezing or shortness of breath., Disp: 18 g, Rfl: 0   predniSONE (STERAPRED UNI-PAK 21 TAB) 10 MG (21) TBPK tablet, Take by mouth daily. Take 6 tabs by mouth daily  for 2 days, then 5 tabs for 2 days, then 4 tabs for 2 days, then 3 tabs for 2 days, 2 tabs for 2 days, then 1 tab by mouth daily for 2 days, Disp: 42 tablet, Rfl: 0   albuterol (PROVENTIL) (2.5 MG/3ML) 0.083% nebulizer solution, Take 3 mLs (2.5 mg total) by nebulization every 6 (six) hours as needed for wheezing or shortness of breath., Disp: 75 mL, Rfl: 0   benzonatate (TESSALON) 100 MG capsule, Take 2 capsules (200 mg total) by mouth every 8 (eight) hours., Disp: 21 capsule, Rfl: 0   buPROPion (WELLBUTRIN XL) 300 MG 24 hr tablet, Take 300 mg by mouth daily., Disp: , Rfl:    cetirizine (ZYRTEC) 10 MG tablet, Take by mouth., Disp: , Rfl:    escitalopram (LEXAPRO) 20 MG tablet, Take 20 mg by mouth daily. , Disp: , Rfl: 1   fexofenadine-pseudoephedrine (ALLEGRA-D) 60-120 MG 12 hr tablet, Take 1 tablet by mouth every 12 (twelve) hours., Disp: 30 tablet, Rfl: 0   fluticasone (FLONASE) 50 MCG/ACT nasal spray, Place 2 sprays into both nostrils daily., Disp: 16 g, Rfl: 0   Fluticasone-Salmeterol (ADVAIR) 500-50 MCG/DOSE AEPB, Inhale 1 puff into the lungs 2 (two) times daily., Disp: , Rfl:    hydrocortisone (ANUSOL-HC) 25 MG suppository, Place 25 mg rectally 2  (two) times daily., Disp: , Rfl:    ipratropium (ATROVENT) 0.06 % nasal spray, Place 2 sprays into both nostrils 4 (four) times daily., Disp: 15 mL, Rfl: 12   ipratropium-albuterol (DUONEB) 0.5-2.5 (3) MG/3ML SOLN, Take 3 mLs by nebulization every 6 (six) hours as needed (wheezing/shortness of breath.)., Disp: 360 mL, Rfl:    montelukast (SINGULAIR) 10 MG tablet, Take 1 tablet (10 mg total) by mouth daily., Disp: 30 tablet, Rfl: 0   montelukast (SINGULAIR) 10 MG tablet, Take by mouth., Disp: , Rfl:    omeprazole (PRILOSEC) 20 MG capsule, Take 1 capsule (20 mg total) by mouth 2 (two) times daily before a meal., Disp: 60 capsule, Rfl: 2   promethazine-phenylephrine (PROMETHAZINE VC) 6.25-5 MG/5ML SYRP, Take 5 mLs by mouth every 6 (six) hours as needed for congestion., Disp: 180 mL, Rfl: 0   Medications ordered in this encounter:  Meds ordered this encounter  Medications   albuterol (VENTOLIN HFA) 108 (90 Base) MCG/ACT inhaler    Sig: Inhale 1-2 puffs into the lungs every 6 (six) hours as needed for wheezing or shortness of breath.    Dispense:  18 g    Refill:  0    Order Specific Question:   Supervising Provider    Answer:   MILLER, BRIAN [[id number]]   albuterol (PROVENTIL) (2.5 MG/3ML) 0.083% nebulizer solution    Sig: Take 3 mLs (2.5 mg total) by nebulization  every 6 (six) hours as needed for wheezing or shortness of breath.    Dispense:  75 mL    Refill:  0    Order Specific Question:   Supervising Provider    Answer:   MILLER, BRIAN [[id number]]   predniSONE (STERAPRED UNI-PAK 21 TAB) 10 MG (21) TBPK tablet    Sig: Take by mouth daily. Take 6 tabs by mouth daily  for 2 days, then 5 tabs for 2 days, then 4 tabs for 2 days, then 3 tabs for 2 days, 2 tabs for 2 days, then 1 tab by mouth daily for 2 days    Dispense:  42 tablet    Refill:  0    Order Specific Question:   Supervising Provider    Answer:   Noemi Chapel [[id number]]     *If you need refills on other medications prior to your next  appointment, please contact your pharmacy*  Follow-Up: Call back or seek an in-person evaluation if the symptoms worsen or if the condition fails to improve as anticipated.  Other Instructions Albuterol inhaler and nebulizer solution prescribed Prednisone taper prescribed Take steroid as prescribed and to completion Follow up with PCP next week for recheck and to ensure symptoms are improving Follow up in person at urgent care or go to ER if you have any new or worsening symptoms such as shortness of breath, difficulty breathing, accessory muscle use, rib retraction, or if symptoms do not improve with medication    If you have been instructed to have an in-person evaluation today at a local Urgent Care facility, please use the link below. It will take you to a list of all of our available [facility], including address, phone number and hours of operation. Please do not delay care.  [facility]  If you or a family member do not have a primary care provider, use the link below to schedule a visit and establish care. When you choose a [facility] primary care physician or advanced practice provider, you gain a long-term partner in health. Find a Primary Care Provider  Learn more about [facility]'s in-office and virtual care options: [facility]

## 2021-05-03 ENCOUNTER — Emergency Department
Admission: EM | Admit: 2021-05-03 | Discharge: 2021-05-03 | Disposition: A | Discharge status: Home / Self Care | Payer: Medicaid Other | Attending: Emergency Medicine | Admitting: Emergency Medicine

## 2021-05-03 ENCOUNTER — Emergency Department: Payer: Medicaid Other

## 2021-05-03 ENCOUNTER — Other Ambulatory Visit: Payer: Self-pay

## 2021-05-03 DIAGNOSIS — J4541 Moderate persistent asthma with (acute) exacerbation: Secondary | ICD-10-CM | POA: Diagnosis not present

## 2021-05-03 DIAGNOSIS — R059 Cough, unspecified: Secondary | ICD-10-CM | POA: Diagnosis present

## 2021-05-03 DIAGNOSIS — U071 COVID-19: Secondary | ICD-10-CM | POA: Diagnosis not present

## 2021-05-03 LAB — RESP PANEL BY RT-PCR (FLU A&B, COVID) ARPGX2
Influenza A by PCR: NEGATIVE
Influenza B by PCR: NEGATIVE
SARS Coronavirus 2 by RT PCR: POSITIVE — AB

## 2021-05-03 LAB — URINALYSIS, ROUTINE W REFLEX MICROSCOPIC
Bilirubin Urine: NEGATIVE
Glucose, UA: NEGATIVE mg/dL
Hgb urine dipstick: NEGATIVE
Ketones, ur: NEGATIVE mg/dL
Leukocytes,Ua: NEGATIVE
Nitrite: NEGATIVE
Protein, ur: NEGATIVE mg/dL
Specific Gravity, Urine: 1.025 (ref 1.005–1.030)
pH: 6 (ref 5.0–8.0)

## 2021-05-03 MED ORDER — ALBUTEROL SULFATE (2.5 MG/3ML) 0.083% IN NEBU: 2.5000 mg | INHALATION_SOLUTION | Freq: Four times a day (QID) | RESPIRATORY_TRACT | 0 refills | Status: DC | PRN

## 2021-05-03 MED ORDER — DEXAMETHASONE SODIUM PHOSPHATE 10 MG/ML IJ SOLN
10.0000 mg | Freq: Once | INTRAMUSCULAR | Status: AC
  Administered 2021-05-03: 10 mg via INTRAMUSCULAR
  Filled 2021-05-03: qty 1

## 2021-05-03 MED ORDER — ALBUTEROL SULFATE HFA 108 (90 BASE) MCG/ACT IN AERS
1.0000 | INHALATION_SPRAY | Freq: Four times a day (QID) | RESPIRATORY_TRACT | 0 refills | Status: DC | PRN
Start: 2021-05-03 — End: 2021-08-01

## 2021-05-03 MED ORDER — BENZONATATE 100 MG PO CAPS: 200.0000 mg | ORAL_CAPSULE | Freq: Three times a day (TID) | ORAL | 0 refills | Status: DC

## 2021-05-03 MED ORDER — NIRMATRELVIR/RITONAVIR (PAXLOVID)TABLET
3.0000 | ORAL_TABLET | Freq: Two times a day (BID) | ORAL | 0 refills | Status: AC
Start: 2021-05-03 — End: 2021-05-08

## 2021-05-03 NOTE — Discharge Instructions (Addendum)
Take the prescription meds as directed.

## 2021-05-03 NOTE — ED Provider Notes (Signed)
[facility] Provider Note  Patient Contact: 11:36 AM (approximate)   History   Cough   HPI  Sabrina Holder is a 41 y.o. female with a history of moderate persistent asthma, obesity, and prediabetes, presents to the ED for evaluation of 3 days of fever, cough, congestion.  Patient notes similar symptoms in her extended family, reports that her daughter tested positive for COVID yesterday.  Patient notes body aches, malaise, and a sense of phlegm in her throat.  She denies any nausea, vomiting, or diarrhea.     Physical Exam   Triage Vital Signs: ED Triage Vitals  Enc Vitals Group     BP 05/03/21 0928 (!) 156/101     Pulse Rate 05/03/21 0928 86     Resp 05/03/21 0928 18     Temp 05/03/21 0928 98.9 F (37.2 C)     Temp Source 05/03/21 0928 Oral     SpO2 05/03/21 0928 97 %     Weight 05/03/21 0915 253 lb 15.5 oz (115.2 kg)     Height 05/03/21 0915 5\' 4"  (1.626 m)     Head Circumference --      Peak Flow --      Pain Score 05/03/21 0914 0     Pain Loc --      Pain Edu? --      Excl. in [place]? --     Most recent vital signs: Vitals:   05/03/21 0928  BP: (!) 156/101  Pulse: 86  Resp: 18  Temp: 98.9 F (37.2 C)  SpO2: 97%     General: Alert and in no acute distress. Cardiovascular:  Good peripheral perfusion Respiratory: Normal respiratory effort without tachypnea or retractions. Lungs CTAB.  Musculoskeletal: Full range of motion to all extremities.  Neurologic:  No gross focal neurologic deficits are appreciated.  Skin:   No rash noted Other:   ED Results / Procedures / Treatments   Labs (all labs ordered are listed, but only abnormal results are displayed) Labs Reviewed  RESP PANEL BY RT-PCR (FLU A&B, COVID) ARPGX2 - Abnormal; Notable for the following components:      Result Value   SARS Coronavirus 2 by RT PCR POSITIVE (*)    All other components within normal limits  URINALYSIS, ROUTINE W REFLEX MICROSCOPIC      EKG   RADIOLOGY  I personally viewed and evaluated these images as part of my medical decision making, as well as reviewing the written report by the radiologist.  ED Provider Interpretation: no acute findings  DG Chest 2 View  Result Date: 05/03/2021 CLINICAL DATA:  Cough, fever, asthma EXAM: CHEST - 2 VIEW COMPARISON:  12/12/2020 FINDINGS: The heart size and mediastinal contours are within normal limits. Both lungs are clear. The visualized skeletal structures are unremarkable. IMPRESSION: No acute abnormality of the lungs. Electronically Signed   By: Delanna Ahmadi M.D.   On: 05/03/2021 10:13    PROCEDURES:  Critical Care performed: No  Procedures   MEDICATIONS ORDERED IN ED: Medications  dexamethasone (DECADRON) injection 10 mg (10 mg Intramuscular Given 05/03/21 1047)     IMPRESSION / MDM / ASSESSMENT AND PLAN / ED COURSE  I reviewed the triage vital signs and the nursing notes.                              Differential diagnosis includes, but is not limited to, Covid, influenza, CAP, bronchitis, viral  URI  Patient with ED evaluation of symptoms including cough, congestion, subjective fevers.  She reports tenderness since about 3 days prior, she had malaise and body aches.  She has a history of mild persistent asthma that has been aggravated over the last 2 weeks.  Evaluation in the ED for symptoms reveals patient in no acute distress without toxic appearance or respiratory distress.  Patient's viral panel screen does confirm COVID.  Radiologic evaluation by chest x-ray, and reviewed by me does not reveal any acute intrathoracic process.  Patient's diagnosis is consistent with COVID. Patient will be discharged home with prescriptions for Paxlovid, albuterol inhaler, albuterol neb, and Tessalon Perles. Patient is to follow up with her PCP as needed or otherwise directed. Patient is given ED precautions to return to the ED for any worsening or new symptoms.   FINAL CLINICAL  IMPRESSION(S) / ED DIAGNOSES   Final diagnoses:  Moderate persistent asthma with exacerbation     Rx / DC Orders   ED Discharge Orders          Ordered    albuterol (PROVENTIL) (2.5 MG/3ML) 0.083% nebulizer solution  Every 6 hours PRN        05/03/21 1042    albuterol (VENTOLIN HFA) 108 (90 Base) MCG/ACT inhaler  Every 6 hours PRN        05/03/21 1042    benzonatate (TESSALON) 100 MG capsule  Every 8 hours        05/03/21 1042    nirmatrelvir/ritonavir EUA (PAXLOVID) 20 x 150 MG & 10 x 100MG  TABS  2 times daily        05/03/21 1105             Note:  This document was prepared using Dragon voice recognition software and may include unintentional dictation errors.    Melvenia Needles, PA-C 05/03/21 1140    Delman Kitten, MD 05/03/21 [phone]

## 2021-05-03 NOTE — ED Notes (Addendum)
Asthma started acting up a month ago and states in the past week her asthma has gotten worse and states that she is hoarse and coughing.pt denies fever and states that she has burning with urination for the past couple months, states that she was hoping it would go away and it hasn't. Pt states that she's had bv in the past and wonders if she has another case, denies and discharge.  Pt states that her son came home from school vomiting and her daughter took an at home test that was pos for covid  Pt reports that she has been using her inhalers and nebulizers without relief

## 2021-05-03 NOTE — ED Triage Notes (Signed)
C/O cough, fever x 3 days. Also asthma has been acting up for 2 weeks.  AAOx3.  Skin warm and dry .NAD

## 2021-07-15 ENCOUNTER — Emergency Department
Admission: EM | Admit: 2021-07-15 | Discharge: 2021-07-15 | Disposition: A | Discharge status: Home / Self Care | Payer: Medicaid Other | Attending: Student in an Organized Health Care Education/Training Program | Admitting: Student in an Organized Health Care Education/Training Program

## 2021-07-15 ENCOUNTER — Other Ambulatory Visit: Payer: Self-pay

## 2021-07-15 ENCOUNTER — Emergency Department: Payer: Medicaid Other

## 2021-07-15 ENCOUNTER — Encounter: Payer: Self-pay | Admitting: Emergency Medicine

## 2021-07-15 DIAGNOSIS — J4521 Mild intermittent asthma with (acute) exacerbation: Secondary | ICD-10-CM | POA: Diagnosis not present

## 2021-07-15 DIAGNOSIS — F17210 Nicotine dependence, cigarettes, uncomplicated: Secondary | ICD-10-CM | POA: Insufficient documentation

## 2021-07-15 DIAGNOSIS — M1712 Unilateral primary osteoarthritis, left knee: Secondary | ICD-10-CM | POA: Diagnosis not present

## 2021-07-15 DIAGNOSIS — M25562 Pain in left knee: Secondary | ICD-10-CM | POA: Diagnosis present

## 2021-07-15 DIAGNOSIS — J45901 Unspecified asthma with (acute) exacerbation: Secondary | ICD-10-CM

## 2021-07-15 MED ORDER — PREDNISONE 20 MG PO TABS
60.0000 mg | ORAL_TABLET | Freq: Once | ORAL | Status: AC
  Administered 2021-07-15: 60 mg via ORAL
  Filled 2021-07-15: qty 3

## 2021-07-15 MED ORDER — PREDNISONE 10 MG PO TABS: ORAL_TABLET | ORAL | 0 refills | Status: DC

## 2021-07-15 NOTE — Discharge Instructions (Signed)
With your primary care provider if any continued problems.  Also if the over-the-counter Aleve is not helping with your arthritis she may need to see your primary care provider.  The prednisone that was sent to the pharmacy temporarily may help with your knee pain but mostly it is for your asthma.  Continue to using your Singulair and also inhalers.  When you do get the Aleve it is 2 tablets twice a day with food. ?

## 2021-07-15 NOTE — ED Triage Notes (Signed)
Pt to ED via POV stating that she is has a hx/o of asthma. Pt reports that she has a lot of mucus in her throat and it is causing her not to be able to sleep. Pt also states that she is having pain in her left knee. Pt states that she has been having pain in her left knee x 1 month, pain is now moving into her entire leg. Pt states that she just woke up one morning and was unable to stand on her knee. Pt denies fall or injury to the knee. Pt is in NAD.  ?

## 2021-07-15 NOTE — ED Provider Notes (Signed)
? ?[facility] ?Provider Note ? ? ? Event Date/Time  ? First MD Initiated Contact with Patient 07/15/21 [phone]   ?  (approximate) ? ? ?History  ? ?Asthma and Knee Pain ? ? ?HPI ? ?Sabrina Holder is a 41 y.o. female   presents to the ED with complaint of left knee pain for 1 month without history of injury.  Patient is also here due to wheezing.  Patient has history of asthma and continues to use her inhalers plus Singulair.  She denies any fever or chills.  She reports a lot of mucus.  Patient smokes 1/2 pack cigarettes per day.  She has a history of GERD, anemia, and asthma. ? ?  ? ? ?Physical Exam  ? ?Triage Vital Signs: ?ED Triage Vitals  ?Enc Vitals Group  ?   BP 07/15/21 0927 135/85  ?   Pulse Rate 07/15/21 0927 95  ?   Resp 07/15/21 0927 16  ?   Temp 07/15/21 0927 98.3 ?F (36.8 ?C)  ?   Temp Source 07/15/21 0927 Oral  ?   SpO2 07/15/21 0927 93 %  ?   Weight 07/15/21 0924 264 lb (119.7 kg)  ?   Height 07/15/21 0924 '5\' 4"'$  (1.626 m)  ?   Head Circumference --   ?   Peak Flow --   ?   Pain Score 07/15/21 0924 6  ?   Pain Loc --   ?   Pain Edu? --   ?   Excl. in [place]? --   ? ? ?Most recent vital signs: ?Vitals:  ? 07/15/21 0927  ?BP: 135/85  ?Pulse: 95  ?Resp: 16  ?Temp: 98.3 ?F (36.8 ?C)  ?SpO2: 93%  ? ? ? ?General: Awake, no distress.  ?CV:  Good peripheral perfusion.  Heart regular rate and rhythm. ?Resp:  Normal effort.  Bilateral expiratory wheezes noted throughout with no labored breathing and no accessory muscles used.  Patient is able to talk in complete sentences without any difficulty. ?Abd:  No distention.  ?Other:  Left knee exam is without deformity however with range of motion there is crepitus.  No point tenderness on palpation and no soft tissue edema present.  No erythema or warmth is appreciated.  Patient is able to weight-bear. ? ? ?ED Results / Procedures / Treatments  ? ?Labs ?(all labs ordered are listed, but only abnormal results are displayed) ?Labs Reviewed - No data  to display ? ? ?RADIOLOGY ? ?Chest x-ray images were reviewed by me independently and no infiltrate was noted.  X-ray of the left knee also reviewed by me independently showed some arthritic changes medially.  Radiology report of the chest x-ray showed no acute cardiopulmonary changes.  Left knee x-ray per radiologist reports osteoarthritis more prominent in the medial compartment. ? ? ?PROCEDURES: ? ?Critical Care performed:  ? ?Procedures ? ? ?MEDICATIONS ORDERED IN ED: ?Medications  ?predniSONE (DELTASONE) tablet 60 mg (60 mg Oral Given 07/15/21 1115)  ? ? ? ?IMPRESSION / MDM / ASSESSMENT AND PLAN / ED COURSE  ?I reviewed the triage vital signs and the nursing notes. ? ? ?Differential diagnosis includes, but is not limited to, exacerbation of asthma due to seasonal changes, bronchitis, pneumonia.  Chronic left knee pain, osteoarthritis. ? ?41 year old female presents to the ED with complaint of asthma exacerbation due to allergies despite her using her Singulair and inhalers.  She has been on steroids in the past.  She also is here due to left  knee pain that has been going on for 1 month without history of injury.  On exam there is crepitus with range of motion.  Chest x-ray was negative for acute cardiopulmonary disease and left knee x-ray shows osteoarthritis consistent with the crepitus noted on exam.  Patient was started on prednisone which most likely will help with her asthma and knee at this time.  After she finishes the prednisone she is to obtain some Aleve and take 2 twice a day with food to see if this helps with inflammation.  She is advised to return to her PCP if any continued problems. ? ? ? ?  ? ? ?FINAL CLINICAL IMPRESSION(S) / ED DIAGNOSES  ? ?Final diagnoses:  ?Osteoarthritis of left knee, unspecified osteoarthritis type  ?Mild asthma with exacerbation, unspecified whether persistent  ? ? ? ?Rx / DC Orders  ? ?ED Discharge Orders   ? ?      Ordered  ?  predniSONE (DELTASONE) 10 MG tablet       ?  07/15/21 1113  ? ?  ?  ? ?  ? ? ? ?Note:  This document was prepared using Dragon voice recognition software and may include unintentional dictation errors. ?  ?Johnn Hai, PA-C ?07/15/21 1119 ? ?  ?Merlyn Lot, MD ?07/15/21 1236 ? ?

## 2021-07-15 NOTE — ED Notes (Addendum)
Pt to ED with concern over breathing. Pt has hx asthma and states recently had runny nose and how has mucous stuck in throat and her breathing feels tight. Pt did 4 duoneb treatments this morning at home.  ? ?Pt ambulatory to room, limping slightly/bearing weight on R leg more than L. States that about 1 month ago, she woke up and L knee was aching and that since then, whole lateral L leg is at times achy and at times numb. Denies sharp, shooting pain down leg or in buttock. States that knee pain has been making it hard to walk recently but came in today because she is walking more easily today and wanted to be checked out for her breathing. ?

## 2021-08-01 ENCOUNTER — Telehealth: Payer: Medicaid Other | Admitting: Physician Assistant

## 2021-08-01 DIAGNOSIS — J019 Acute sinusitis, unspecified: Secondary | ICD-10-CM

## 2021-08-01 DIAGNOSIS — J4541 Moderate persistent asthma with (acute) exacerbation: Secondary | ICD-10-CM

## 2021-08-01 DIAGNOSIS — B9689 Other specified bacterial agents as the cause of diseases classified elsewhere: Secondary | ICD-10-CM | POA: Diagnosis not present

## 2021-08-01 MED ORDER — BENZONATATE 100 MG PO CAPS
100.0000 mg | ORAL_CAPSULE | Freq: Three times a day (TID) | ORAL | 0 refills | Status: DC | PRN
Start: 2021-08-01 — End: 2021-09-09

## 2021-08-01 MED ORDER — PREDNISONE 20 MG PO TABS: 40.0000 mg | ORAL_TABLET | Freq: Every day | ORAL | 0 refills | Status: DC

## 2021-08-01 MED ORDER — ALBUTEROL SULFATE HFA 108 (90 BASE) MCG/ACT IN AERS: 1.0000 | INHALATION_SPRAY | Freq: Four times a day (QID) | RESPIRATORY_TRACT | 0 refills | Status: DC | PRN

## 2021-08-01 MED ORDER — IPRATROPIUM-ALBUTEROL 0.5-2.5 (3) MG/3ML IN SOLN: 3.0000 mL | Freq: Four times a day (QID) | RESPIRATORY_TRACT | 0 refills | Status: DC | PRN

## 2021-08-01 MED ORDER — DOXYCYCLINE HYCLATE 100 MG PO TABS: 100.0000 mg | ORAL_TABLET | Freq: Two times a day (BID) | ORAL | 0 refills | Status: DC

## 2021-08-01 NOTE — Progress Notes (Signed)
?Virtual Visit Consent  ? ?Sabrina Holder, you are scheduled for a virtual visit with a [facility] provider today.   ?  ?Just as with appointments in the office, your consent must be obtained to participate.  Your consent will be active for this visit and any virtual visit you may have with one of our providers in the next 365 days.   ?  ?If you have a MyChart account, a copy of this consent can be sent to you electronically.  All virtual visits are billed to your insurance company just like a traditional visit in the office.   ? ?As this is a virtual visit, video technology does not allow for your provider to perform a traditional examination.  This may limit your provider's ability to fully assess your condition.  If your provider identifies any concerns that need to be evaluated in person or the need to arrange testing (such as labs, EKG, etc.), we will make arrangements to do so.   ?  ?Although advances in technology are sophisticated, we cannot ensure that it will always work on either your end or our end.  If the connection with a video visit is poor, the visit may have to be switched to a telephone visit.  With either a video or telephone visit, we are not always able to ensure that we have a secure connection.   ? ?Also, by engaging in this virtual visit, you consent to the provision of healthcare. Additionally, you authorize for your insurance to be billed (if applicable) for the services provided during this visit. I also discussed with the patient that there may be a patient responsible charge related to this service. ? ?I need to obtain your verbal consent now.   Are you willing to proceed with your visit today?  ?  ?[name] has provided verbal consent on 08/01/2021 for a virtual visit (video or telephone). ?  ?Leeanne Rio, PA-C  ? ?Date: 08/01/2021 10:49 AM ? ? ?Virtual Visit via Video Note  ? ?ILeeanne Rio, connected with  Sabrina Holder  ([id number], 12-06-1980) on  08/01/21 at 10:15 AM EDT by a video-enabled telemedicine application and verified that I am speaking with the correct person using two identifiers. ? ?Location: ?Patient: Virtual Visit Location Patient: Home ?Provider: Virtual Visit Location Provider: Home Office ?  ?I discussed the limitations of evaluation and management by telemedicine and the availability of in person appointments. The patient expressed understanding and agreed to proceed.   ? ?History of Present Illness: ?Sabrina Holder is a 41 y.o. who identifies as a female who was assigned female at birth, and is being seen today for head and nasal congestion with sinus pressure that has been happening off an on since her allergy season began, but now with sinus pain and flare of her asthma. Notes that allergies triggering her asthma really bad. Typically has exacerbations this time of year but notes this year seems especially bad. Is on regimen of Advair daily, Duonebs when needed, albuterol when needed, Singulair daily, Allegra daily and Flonase daily. Duoneb ran out of those about a week ago. Has not gotten her refill from her PCP.  She denies any fever, chills. Denies any chest pain. Wheezing noted but notes that her congestion seems to be all in her head and sinuses.  ? ?HPI: HPI  ?Problems:  ?Patient Active Problem List  ? Diagnosis Date Noted  ? Menorrhagia with regular cycle 12/15/2019  ? Anemia  due to chronic blood loss 12/15/2019  ? Class 2 obesity due to excess calories with body mass index (BMI) of 39.0 to 39.9 in adult 11/05/2017  ? Prediabetes 11/05/2017  ? Moderate asthma with acute exacerbation 03/29/2017  ?  ?Allergies: No Known Allergies ?Medications:  ?Current Outpatient Medications:  ?  benzonatate (TESSALON) 100 MG capsule, Take 1 capsule (100 mg total) by mouth 3 (three) times daily as needed for cough., Disp: 30 capsule, Rfl: 0 ?  doxycycline (VIBRA-TABS) 100 MG tablet, Take 1 tablet (100 mg total) by mouth 2 (two) times daily.,  Disp: 20 tablet, Rfl: 0 ?  predniSONE (DELTASONE) 20 MG tablet, Take 2 tablets (40 mg total) by mouth daily with breakfast., Disp: 10 tablet, Rfl: 0 ?  albuterol (PROVENTIL) (2.5 MG/3ML) 0.083% nebulizer solution, Take 3 mLs (2.5 mg total) by nebulization every 6 (six) hours as needed for wheezing or shortness of breath., Disp: 75 mL, Rfl: 0 ?  albuterol (VENTOLIN HFA) 108 (90 Base) MCG/ACT inhaler, Inhale 1-2 puffs into the lungs every 6 (six) hours as needed for wheezing or shortness of breath., Disp: 18 g, Rfl: 0 ?  buPROPion (WELLBUTRIN XL) 300 MG 24 hr tablet, Take 300 mg by mouth daily., Disp: , Rfl:  ?  cetirizine (ZYRTEC) 10 MG tablet, Take by mouth., Disp: , Rfl:  ?  escitalopram (LEXAPRO) 20 MG tablet, Take 20 mg by mouth daily. , Disp: , Rfl: 1 ?  fexofenadine-pseudoephedrine (ALLEGRA-D) 60-120 MG 12 hr tablet, Take 1 tablet by mouth every 12 (twelve) hours., Disp: 30 tablet, Rfl: 0 ?  fluticasone (FLONASE) 50 MCG/ACT nasal spray, Place 2 sprays into both nostrils daily., Disp: 16 g, Rfl: 0 ?  Fluticasone-Salmeterol (ADVAIR) 500-50 MCG/DOSE AEPB, Inhale 1 puff into the lungs 2 (two) times daily., Disp: , Rfl:  ?  hydrocortisone (ANUSOL-HC) 25 MG suppository, Place 25 mg rectally 2 (two) times daily., Disp: , Rfl:  ?  ipratropium (ATROVENT) 0.06 % nasal spray, Place 2 sprays into both nostrils 4 (four) times daily., Disp: 15 mL, Rfl: 12 ?  ipratropium-albuterol (DUONEB) 0.5-2.5 (3) MG/3ML SOLN, Take 3 mLs by nebulization every 6 (six) hours as needed (wheezing/shortness of breath.)., Disp: 120 mL, Rfl: 0 ?  montelukast (SINGULAIR) 10 MG tablet, Take 1 tablet (10 mg total) by mouth daily., Disp: 30 tablet, Rfl: 0 ?  montelukast (SINGULAIR) 10 MG tablet, Take by mouth., Disp: , Rfl:  ?  omeprazole (PRILOSEC) 20 MG capsule, Take 1 capsule (20 mg total) by mouth 2 (two) times daily before a meal., Disp: 60 capsule, Rfl: 2 ?  promethazine-phenylephrine (PROMETHAZINE VC) 6.25-5 MG/5ML SYRP, Take 5 mLs by mouth  every 6 (six) hours as needed for congestion., Disp: 180 mL, Rfl: 0 ? ?Observations/Objective: ?Patient is well-developed, well-nourished in no acute distress.  ?Resting comfortably on couch at home.  ?Head is normocephalic, atraumatic.  ?No labored breathing. ?Speech is clear and coherent with logical content.  ?Patient is alert and oriented at baseline.  ? ?Assessment and Plan: ?1. Moderate persistent asthma with exacerbation ?- predniSONE (DELTASONE) 20 MG tablet; Take 2 tablets (40 mg total) by mouth daily with breakfast.  Dispense: 10 tablet; Refill: 0 ?- benzonatate (TESSALON) 100 MG capsule; Take 1 capsule (100 mg total) by mouth 3 (three) times daily as needed for cough.  Dispense: 30 capsule; Refill: 0 ?- albuterol (VENTOLIN HFA) 108 (90 Base) MCG/ACT inhaler; Inhale 1-2 puffs into the lungs every 6 (six) hours as needed for wheezing or shortness of breath.  Dispense: 18 g; Refill: 0 ?- ipratropium-albuterol (DUONEB) 0.5-2.5 (3) MG/3ML SOLN; Take 3 mLs by nebulization every 6 (six) hours as needed (wheezing/shortness of breath.).  Dispense: 120 mL; Refill: 0 ? ?2. Acute bacterial sinusitis ?- benzonatate (TESSALON) 100 MG capsule; Take 1 capsule (100 mg total) by mouth 3 (three) times daily as needed for cough.  Dispense: 30 capsule; Refill: 0 ?- doxycycline (VIBRA-TABS) 100 MG tablet; Take 1 tablet (100 mg total) by mouth 2 (two) times daily.  Dispense: 20 tablet; Refill: 0 ? ?Rx Prednisone burst x 5 days. Duoneb and albuterol refilled as she is out. Continue Adviar, Pierre and Allegra. Rx Doxycycline. Tessalon for cough. .  Increase fluids.  Rest.  Saline nasal spray.  Probiotic.  Mucinex as directed.  Humidifier in bedroom. Strict ER precautions discussed. She is to go ahead and schedule routine follow-up with her PCP/specialist regarding her allergic asthma to see about getting better daily control of this.  Call or return to clinic if symptoms are not improving. ? ? ?Follow Up Instructions: ?I  discussed the assessment and treatment plan with the patient. The patient was provided an opportunity to ask questions and all were answered. The patient agreed with the plan and demonstrated an understandin

## 2021-08-01 NOTE — Patient Instructions (Signed)
?[name], thank you for joining Leeanne Rio, PA-C for today's virtual visit.  While this provider is not your primary care provider (PCP), if your PCP is located in our provider database this encounter information will be shared with them immediately following your visit. ? ?Consent: ?(Patient) Sabrina Holder provided verbal consent for this virtual visit at the beginning of the encounter. ? ?Current Medications: ? ?Current Outpatient Medications:  ?  albuterol (PROVENTIL) (2.5 MG/3ML) 0.083% nebulizer solution, Take 3 mLs (2.5 mg total) by nebulization every 6 (six) hours as needed for wheezing or shortness of breath., Disp: 75 mL, Rfl: 0 ?  albuterol (VENTOLIN HFA) 108 (90 Base) MCG/ACT inhaler, Inhale 1-2 puffs into the lungs every 6 (six) hours as needed for wheezing or shortness of breath., Disp: 18 g, Rfl: 0 ?  buPROPion (WELLBUTRIN XL) 300 MG 24 hr tablet, Take 300 mg by mouth daily., Disp: , Rfl:  ?  cetirizine (ZYRTEC) 10 MG tablet, Take by mouth., Disp: , Rfl:  ?  escitalopram (LEXAPRO) 20 MG tablet, Take 20 mg by mouth daily. , Disp: , Rfl: 1 ?  fexofenadine-pseudoephedrine (ALLEGRA-D) 60-120 MG 12 hr tablet, Take 1 tablet by mouth every 12 (twelve) hours., Disp: 30 tablet, Rfl: 0 ?  fluticasone (FLONASE) 50 MCG/ACT nasal spray, Place 2 sprays into both nostrils daily., Disp: 16 g, Rfl: 0 ?  Fluticasone-Salmeterol (ADVAIR) 500-50 MCG/DOSE AEPB, Inhale 1 puff into the lungs 2 (two) times daily., Disp: , Rfl:  ?  hydrocortisone (ANUSOL-HC) 25 MG suppository, Place 25 mg rectally 2 (two) times daily., Disp: , Rfl:  ?  ipratropium (ATROVENT) 0.06 % nasal spray, Place 2 sprays into both nostrils 4 (four) times daily., Disp: 15 mL, Rfl: 12 ?  ipratropium-albuterol (DUONEB) 0.5-2.5 (3) MG/3ML SOLN, Take 3 mLs by nebulization every 6 (six) hours as needed (wheezing/shortness of breath.)., Disp: 360 mL, Rfl:  ?  montelukast (SINGULAIR) 10 MG tablet, Take 1 tablet (10 mg total) by mouth  daily., Disp: 30 tablet, Rfl: 0 ?  montelukast (SINGULAIR) 10 MG tablet, Take by mouth., Disp: , Rfl:  ?  omeprazole (PRILOSEC) 20 MG capsule, Take 1 capsule (20 mg total) by mouth 2 (two) times daily before a meal., Disp: 60 capsule, Rfl: 2 ?  predniSONE (DELTASONE) 10 MG tablet, Take 6 tablets  starting tomorrow, on day 2 take 5 tablets, day 3 take 4 tablets, day 4 take 3 tablets, day 5 take  2 tablets and 1 tablet the last day, Disp: 21 tablet, Rfl: 0 ?  promethazine-phenylephrine (PROMETHAZINE VC) 6.25-5 MG/5ML SYRP, Take 5 mLs by mouth every 6 (six) hours as needed for congestion., Disp: 180 mL, Rfl: 0  ? ?Medications ordered in this encounter:  ?No orders of the defined types were placed in this encounter. ?  ? ?*If you need refills on other medications prior to your next appointment, please contact your pharmacy* ? ?Follow-Up: ?Call back or seek an in-person evaluation if the symptoms worsen or if the condition fails to improve as anticipated. ? ?Other Instructions ?Please take antibiotic as directed.  Increase fluid intake.  Use Saline nasal spray.  Take a daily multivitamin. Take the steroid and cough medication as directed. Continue your allergy and asthma medications, making sure to schedule a routine follow-up with your regular provider.  Place a humidifier in the bedroom.  Please call or return clinic if symptoms are not improving. ? ?Sinusitis ?Sinusitis is redness, soreness, and swelling (inflammation) of the paranasal sinuses. Paranasal  sinuses are air pockets within the bones of your face (beneath the eyes, the middle of the forehead, or above the eyes). In healthy paranasal sinuses, mucus is able to drain out, and air is able to circulate through them by way of your nose. However, when your paranasal sinuses are inflamed, mucus and air can become trapped. This can allow bacteria and other germs to grow and cause infection. ?Sinusitis can develop quickly and last only a short time (acute) or  continue over a long period (chronic). Sinusitis that lasts for more than 12 weeks is considered chronic.  ?CAUSES  ?Causes of sinusitis include: ?Allergies. ?Structural abnormalities, such as displacement of the cartilage that separates your nostrils (deviated septum), which can decrease the air flow through your nose and sinuses and affect sinus drainage. ?Functional abnormalities, such as when the small hairs (cilia) that line your sinuses and help remove mucus do not work properly or are not present. ?SYMPTOMS  ?Symptoms of acute and chronic sinusitis are the same. The primary symptoms are pain and pressure around the affected sinuses. Other symptoms include: ?Upper toothache. ?Earache. ?Headache. ?Bad breath. ?Decreased sense of smell and taste. ?A cough, which worsens when you are lying flat. ?Fatigue. ?Fever. ?Thick drainage from your nose, which often is green and may contain pus (purulent). ?Swelling and warmth over the affected sinuses. ?DIAGNOSIS  ?Your caregiver will perform a physical exam. During the exam, your caregiver may: ?Look in your nose for signs of abnormal growths in your nostrils (nasal polyps). ?Tap over the affected sinus to check for signs of infection. ?View the inside of your sinuses (endoscopy) with a special imaging device with a light attached (endoscope), which is inserted into your sinuses. ?If your caregiver suspects that you have chronic sinusitis, one or more of the following tests may be recommended: ?Allergy tests. ?Nasal culture A sample of mucus is taken from your nose and sent to a lab and screened for bacteria. ?Nasal cytology A sample of mucus is taken from your nose and examined by your caregiver to determine if your sinusitis is related to an allergy. ?TREATMENT  ?Most cases of acute sinusitis are related to a viral infection and will resolve on their own within 10 days. Sometimes medicines are prescribed to help relieve symptoms (pain medicine, decongestants, nasal  steroid sprays, or saline sprays).  ?However, for sinusitis related to a bacterial infection, your caregiver will prescribe antibiotic medicines. These are medicines that will help kill the bacteria causing the infection.  ?Rarely, sinusitis is caused by a fungal infection. In theses cases, your caregiver will prescribe antifungal medicine. ?For some cases of chronic sinusitis, surgery is needed. Generally, these are cases in which sinusitis recurs more than 3 times per year, despite other treatments. ?HOME CARE INSTRUCTIONS  ?Drink plenty of water. Water helps thin the mucus so your sinuses can drain more easily. ?Use a humidifier. ?Inhale steam 3 to 4 times a day (for example, sit in the bathroom with the shower running). ?Apply a warm, moist washcloth to your face 3 to 4 times a day, or as directed by your caregiver. ?Use saline nasal sprays to help moisten and clean your sinuses. ?Take over-the-counter or prescription medicines for pain, discomfort, or fever only as directed by your caregiver. ?SEEK IMMEDIATE MEDICAL CARE IF: ?You have increasing pain or severe headaches. ?You have nausea, vomiting, or drowsiness. ?You have swelling around your face. ?You have vision problems. ?You have a stiff neck. ?You have difficulty breathing. ?MAKE SURE YOU:  ?  Understand these instructions. ?Will watch your condition. ?Will get help right away if you are not doing well or get worse. ?Document Released: 03/19/2005 Document Revised: 06/11/2011 Document Reviewed: 04/03/2011 ?ExitCare? Patient Information ?2014 [place], [place]. ? ? ? ?If you have been instructed to have an in-person evaluation today at a local Urgent Care facility, please use the link below. It will take you to a list of all of our available [facility], including address, phone number and hours of operation. Please do not delay care.  ?[facility] ? ?If you or a family member do not have a primary care provider, use the link below to  schedule a visit and establish care. When you choose a [facility] primary care physician or advanced practice provider, you gain a long-term partner in health. ?Find a Primary Care Provider ? ?Learn more about [facility]

## 2021-08-02 MED ORDER — IPRATROPIUM BROMIDE 0.02 % IN SOLN: 0.5000 mg | Freq: Four times a day (QID) | RESPIRATORY_TRACT | 0 refills | Status: DC

## 2021-08-02 NOTE — Addendum Note (Signed)
Addended by: Brunetta Jeans on: 08/02/2021 08:16 AM ? ? Modules accepted: Orders ? ?

## 2021-09-09 ENCOUNTER — Telehealth: Payer: Medicaid Other | Admitting: Nurse Practitioner

## 2021-09-09 DIAGNOSIS — J4541 Moderate persistent asthma with (acute) exacerbation: Secondary | ICD-10-CM

## 2021-09-09 MED ORDER — OMEPRAZOLE 20 MG PO CPDR: 20.0000 mg | DELAYED_RELEASE_CAPSULE | Freq: Two times a day (BID) | ORAL | 2 refills | Status: DC

## 2021-09-09 MED ORDER — FLUTICASONE PROPIONATE 50 MCG/ACT NA SUSP: 2.0000 | Freq: Every day | NASAL | 0 refills | Status: DC

## 2021-09-09 MED ORDER — ALBUTEROL SULFATE HFA 108 (90 BASE) MCG/ACT IN AERS: 1.0000 | INHALATION_SPRAY | Freq: Four times a day (QID) | RESPIRATORY_TRACT | 0 refills | Status: DC | PRN

## 2021-09-09 MED ORDER — PREDNISONE 20 MG PO TABS: 40.0000 mg | ORAL_TABLET | Freq: Every day | ORAL | 0 refills | Status: DC

## 2021-09-09 NOTE — Progress Notes (Signed)
Virtual Visit Consent   Toini Failla, you are scheduled for a virtual visit with Mary-Margaret Hassell Done, [place], a [facility] provider, today.     Just as with appointments in the office, your consent must be obtained to participate.  Your consent will be active for this visit and any virtual visit you may have with one of our providers in the next 365 days.     If you have a MyChart account, a copy of this consent can be sent to you electronically.  All virtual visits are billed to your insurance company just like a traditional visit in the office.    As this is a virtual visit, video technology does not allow for your provider to perform a traditional examination.  This may limit your provider's ability to fully assess your condition.  If your provider identifies any concerns that need to be evaluated in person or the need to arrange testing (such as labs, EKG, etc.), we will make arrangements to do so.     Although advances in technology are sophisticated, we cannot ensure that it will always work on either your end or our end.  If the connection with a video visit is poor, the visit may have to be switched to a telephone visit.  With either a video or telephone visit, we are not always able to ensure that we have a secure connection.     I need to obtain your verbal consent now.   Are you willing to proceed with your visit today? YES   Railynn Ballo has provided verbal consent on 09/09/2021 for a virtual visit (video or telephone).   Mary-Margaret Hassell Done, FNP   Date: 09/09/2021 1:23 PM   Virtual Visit via Video Note   I, Mary-Margaret Hassell Done, connected with Lu Paradise ([id number], 1980/09/19) on 09/09/21 at  1:30 PM EDT by a video-enabled telemedicine application and verified that I am speaking with the correct person using two identifiers.  Location: Patient: Virtual Visit Location Patient: Home Provider: Virtual Visit Location Provider: Mobile   I discussed the  limitations of evaluation and management by telemedicine and the availability of in person appointments. The patient expressed understanding and agreed to proceed.    History of Present Illness: Sabrina Holder is a 41 y.o. who identifies as a female who was assigned female at birth, and is being seen today for asthma  HPI: Patient has asthma. Hr allergies have flared up her asthma. Has gotten worse over the last week  or so. She currently takes Human resources officer, flonase, singulair, and advair daily. Sh ehas ran out of her nebulizer meda and albuterol. Is wheezing and having a hard time taking in a deep breathe  Asthma She complains of shortness of breath and wheezing. Pertinent negatives include no chest pain, headaches or weight loss. Her past medical history is significant for asthma.    Review of Systems  Constitutional:  Negative for diaphoresis and weight loss.  Eyes:  Negative for blurred vision, double vision and pain.  Respiratory:  Positive for shortness of breath and wheezing.   Cardiovascular:  Negative for chest pain, palpitations, orthopnea and leg swelling.  Gastrointestinal:  Negative for abdominal pain.  Skin:  Negative for rash.  Neurological:  Negative for dizziness, sensory change, loss of consciousness, weakness and headaches.  Endo/Heme/Allergies:  Negative for polydipsia. Does not bruise/bleed easily.  Psychiatric/Behavioral:  Negative for memory loss. The patient does not have insomnia.   All other systems reviewed and are  negative.   Problems:  Patient Active Problem List   Diagnosis Date Noted   Menorrhagia with regular cycle 12/15/2019   Anemia due to chronic blood loss 12/15/2019   Class 2 obesity due to excess calories with body mass index (BMI) of 39.0 to 39.9 in adult 11/05/2017   Prediabetes 11/05/2017   Moderate asthma with acute exacerbation 03/29/2017    Allergies: No Known Allergies Medications:  Current Outpatient Medications:    albuterol  (PROVENTIL) (2.5 MG/3ML) 0.083% nebulizer solution, Take 3 mLs (2.5 mg total) by nebulization every 6 (six) hours as needed for wheezing or shortness of breath., Disp: 75 mL, Rfl: 0   albuterol (VENTOLIN HFA) 108 (90 Base) MCG/ACT inhaler, Inhale 1-2 puffs into the lungs every 6 (six) hours as needed for wheezing or shortness of breath., Disp: 18 g, Rfl: 0   benzonatate (TESSALON) 100 MG capsule, Take 1 capsule (100 mg total) by mouth 3 (three) times daily as needed for cough., Disp: 30 capsule, Rfl: 0   buPROPion (WELLBUTRIN XL) 300 MG 24 hr tablet, Take 300 mg by mouth daily., Disp: , Rfl:    cetirizine (ZYRTEC) 10 MG tablet, Take by mouth., Disp: , Rfl:    doxycycline (VIBRA-TABS) 100 MG tablet, Take 1 tablet (100 mg total) by mouth 2 (two) times daily., Disp: 20 tablet, Rfl: 0   escitalopram (LEXAPRO) 20 MG tablet, Take 20 mg by mouth daily. , Disp: , Rfl: 1   fexofenadine-pseudoephedrine (ALLEGRA-D) 60-120 MG 12 hr tablet, Take 1 tablet by mouth every 12 (twelve) hours., Disp: 30 tablet, Rfl: 0   fluticasone (FLONASE) 50 MCG/ACT nasal spray, Place 2 sprays into both nostrils daily., Disp: 16 g, Rfl: 0   Fluticasone-Salmeterol (ADVAIR) 500-50 MCG/DOSE AEPB, Inhale 1 puff into the lungs 2 (two) times daily., Disp: , Rfl:    hydrocortisone (ANUSOL-HC) 25 MG suppository, Place 25 mg rectally 2 (two) times daily., Disp: , Rfl:    ipratropium (ATROVENT) 0.02 % nebulizer solution, Take 2.5 mLs (0.5 mg total) by nebulization 4 (four) times daily., Disp: 75 mL, Rfl: 0   ipratropium (ATROVENT) 0.06 % nasal spray, Place 2 sprays into both nostrils 4 (four) times daily., Disp: 15 mL, Rfl: 12   montelukast (SINGULAIR) 10 MG tablet, Take 1 tablet (10 mg total) by mouth daily., Disp: 30 tablet, Rfl: 0   montelukast (SINGULAIR) 10 MG tablet, Take by mouth., Disp: , Rfl:    omeprazole (PRILOSEC) 20 MG capsule, Take 1 capsule (20 mg total) by mouth 2 (two) times daily before a meal., Disp: 60 capsule, Rfl: 2    predniSONE (DELTASONE) 20 MG tablet, Take 2 tablets (40 mg total) by mouth daily with breakfast., Disp: 10 tablet, Rfl: 0   promethazine-phenylephrine (PROMETHAZINE VC) 6.25-5 MG/5ML SYRP, Take 5 mLs by mouth every 6 (six) hours as needed for congestion., Disp: 180 mL, Rfl: 0  Observations/Objective: Patient is well-developed, well-nourished in no acute distress.  Resting comfortably  at home.  Head is normocephalic, atraumatic.  No labored breathing.  Speech is clear and coherent with logical content.  Patient is alert and oriented at baseline.  Wheezing Trouble taking real deep breathes  Assessment and Plan:  [name] in today with chief complaint of Asthma   1. Moderate persistent asthma with exacerbation Avoid hot weather Force fluids - albuterol (VENTOLIN HFA) 108 (90 Base) MCG/ACT inhaler; Inhale 1-2 puffs into the lungs every 6 (six) hours as needed for wheezing or shortness of breath.  Dispense: 18 g; Refill:  0 - predniSONE (DELTASONE) 20 MG tablet; Take 2 tablets (40 mg total) by mouth daily with breakfast.  Dispense: 10 tablet; Refill: 0   Follow Up Instructions: I discussed the assessment and treatment plan with the patient. The patient was provided an opportunity to ask questions and all were answered. The patient agreed with the plan and demonstrated an understanding of the instructions.  A copy of instructions were sent to the patient via MyChart.  The patient was advised to call back or seek an in-person evaluation if the symptoms worsen or if the condition fails to improve as anticipated.  Time:  I spent 9 minutes with the patient via telehealth technology discussing the above problems/concerns.    Mary-Margaret Hassell Done, FNP

## 2021-09-09 NOTE — Patient Instructions (Signed)
Asthma, Adult  Asthma is a condition that causes swelling and narrowing of the airways. These are the passages that lead from the nose and mouth down into the lungs. When asthma symptoms get worse it is called an asthma attack or flare. This can make it hard to breathe. Asthma flares can range from minor to life-threatening. There is no cure for asthma, but medicines and lifestyle changes can help to control it. What are the causes? It is not known exactly what causes asthma, but certain things can cause asthma symptoms to get worse (triggers). What can trigger an asthma attack? Cigarette smoke. Mold. Dust. Your pet's skin flakes (dander). Cockroaches. Pollen. Air pollution (like household cleaners, wood smoke, smog, or chemical odors). What are the signs or symptoms? Trouble breathing (shortness of breath). Coughing. Making high-pitched whistling sounds when you breathe, most often when you breathe out (wheezing). Chest tightness. Tiredness with little activity. Poor exercise tolerance. How is this treated? Controller medicines that help prevent asthma symptoms. Fast-acting reliever or rescue medicines. These give short-term relief of asthma symptoms. Allergy medicines if your attacks are brought on by allergens. Medicines to help control the body's defense (immune) system. Staying away from the things that cause asthma attacks. Follow these instructions at home: Avoiding triggers in your home Do not allow anyone to smoke in your home. Limit use of fireplaces and wood stoves. Get rid of pests (such as roaches and mice) and their droppings. Keep your home clean. Clean your floors. Dust regularly. Use cleaning products that do not smell. Wash bed sheets and blankets every week in hot water. Dry them in a dryer. Have someone vacuum when you are not home. Change your heating and air conditioning filters often. Use blankets that are made of polyester or cotton. General  instructions Take over-the-counter and prescription medicines only as told by your doctor. Do not smoke or use any products that contain nicotine or tobacco. If you need help quitting, ask your doctor. Stay away from secondhand smoke. Avoid doing things outdoors when allergen counts are high and when air quality is low. Warm up before you exercise. Take time to cool down after exercise. Use a peak flow meter as told by your doctor. A peak flow meter is a tool that measures how well your lungs are working. Keep track of the peak flow meter's readings. Write them down. Follow your asthma action plan. This is a written plan for taking care of your asthma and treating your attacks. Make sure you get all the shots (vaccines) that your doctor recommends. Ask your doctor about a flu shot and a pneumonia shot. Keep all follow-up visits. Contact a doctor if: You have wheezing, shortness of breath, or a cough even while taking medicine to prevent attacks. The mucus you cough up (sputum) is thicker than usual. The mucus you cough up changes from clear or white to yellow, green, gray, or is bloody. You have problems from the medicine you are taking, such as: A rash. Itching. Swelling. Trouble breathing. You need reliever medicines more than 2-3 times a week. Your peak flow reading is still at 50-79% of your personal best after following the action plan for 1 hour. You have a fever. Get help right away if: You seem to be worse and are not responding to medicine during an asthma attack. You are short of breath even at rest. You get short of breath when doing very little activity. You have trouble eating, drinking, or talking. You have chest   pain or tightness. You have a fast heartbeat. Your lips or fingernails start to turn blue. You are light-headed or dizzy, or you faint. Your peak flow is less than 50% of your personal best. You feel too tired to breathe normally. These symptoms may be an  emergency. Get help right away. Call 911. Do not wait to see if the symptoms will go away. Do not drive yourself to the hospital. Summary Asthma is a long-term (chronic) condition in which the airways get tight and narrow. An asthma attack can make it hard to breathe. Asthma cannot be cured, but medicines and lifestyle changes can help control it. Make sure you understand how to avoid triggers and how and when to use your medicines. Avoid things that can cause allergy symptoms (allergens). These include animal skin flakes (dander) and pollen from trees or grass. Avoid things that pollute the air. These may include household cleaners, wood smoke, smog, or chemical odors. This information is not intended to replace advice given to you by your health care provider. Make sure you discuss any questions you have with your health care provider. Document Revised: 12/26/2020 Document Reviewed: 12/26/2020 Elsevier Patient Education  2023 [employer]vier Inc.  

## 2021-09-10 ENCOUNTER — Ambulatory Visit: Payer: Self-pay

## 2021-09-29 ENCOUNTER — Encounter: Payer: Self-pay | Admitting: Primary Care

## 2021-09-29 ENCOUNTER — Telehealth (INDEPENDENT_AMBULATORY_CARE_PROVIDER_SITE_OTHER): Payer: Medicaid Other | Admitting: Primary Care

## 2021-09-29 ENCOUNTER — Telehealth: Payer: Self-pay | Admitting: Primary Care

## 2021-09-29 DIAGNOSIS — J4541 Moderate persistent asthma with (acute) exacerbation: Secondary | ICD-10-CM | POA: Diagnosis not present

## 2021-09-29 DIAGNOSIS — N920 Excessive and frequent menstruation with regular cycle: Secondary | ICD-10-CM | POA: Diagnosis not present

## 2021-09-29 MED ORDER — MONTELUKAST SODIUM 10 MG PO TABS: 10.0000 mg | ORAL_TABLET | Freq: Every day | ORAL | 11 refills | Status: DC

## 2021-09-29 MED ORDER — SPIRIVA RESPIMAT 1.25 MCG/ACT IN AERS: INHALATION_SPRAY | RESPIRATORY_TRACT | 11 refills | Status: DC

## 2021-09-29 MED ORDER — PREDNISONE 10 MG PO TABS: ORAL_TABLET | ORAL | 0 refills | Status: DC

## 2021-09-29 MED ORDER — FLUTICASONE-SALMETEROL 500-50 MCG/ACT IN AEPB: 1.0000 | INHALATION_SPRAY | Freq: Two times a day (BID) | RESPIRATORY_TRACT | 11 refills | Status: DC

## 2021-09-29 NOTE — Progress Notes (Signed)
Virtual Visit via Video Note  I connected with Sabrina Holder on 09/29/21 at 11:30 AM EDT by a video enabled telemedicine application and verified that I am speaking with the correct person using two identifiers.  Location: Patient: Home Provider: Office   I discussed the limitations of evaluation and management by telemedicine and the availability of in person appointments. The patient expressed understanding and agreed to proceed.  History of Present Illness: 41 year old female, current smoker. PMH significant for mild intermittent asthma. Patient of Dr. Mortimer Holder, last seen for virtual visit in December 2021.  Previous [facility] pulmonary encounter:  03/08/2020  Patient contacted today for acute televisit. States that her breathing has been worse since Thursday 03/03/20. She has an associated dry cough. Cold weather exacerbates her asthma symptoms. She is compliant with Advair 500, Spiriva 1.62mg and Singulair. She has been using albuterol rescue inhaler 10 times a day with temporary improvement.  She does improve with oral prednisone, she last received prednsione '20mg'$  x 5 days from her PCP in November 2021. She works at the [employer]    09/29/2021 Patient contacted today for acute overview.  She reports symptoms of shortness of breath at night.  She has woken her self up at night gasping for air. She believes she has sleep apnea.  She has never had a sleep study. She is concerned about cost d/t having medicaid.  She also reports symptoms of increased cough and wheezing last several weeks.  She is currently using Advair 500-593m 1 puff twice daily.  She is not currently taking Spiriva or Singulair.  She has associated allergy symptoms including nasal congestion/postnasal drip.   Observations/Objective:  - No overt shortness of breath, wheezing or cough. Eyes appear red/irritated   Assessment and Plan:  Asthma - Mild exacerbation.  Sending in prednisone taper.  Advised  patient resume Singulair 10 mg at bedtime and Spiriva Respimat 1.25 mcg 2 puffs once daily.  Continue Advair 500-5023m1 puff twice daily.  Recommend checking CBC with differential and respiratory allergy panel at follow-up.   Snoring: -Patient has symptoms of snoring and wakes up gasping at night.  Patient will need to have a formal sleep consult in office prior to ordering sleep study to assess for underlying sleep apnea.  She focus on side sleeping position or elevate head of bed 30 degrees while sleeping.   Follow Up Instructions:  - First available in office for sleep consults and asthma follow--up    I discussed the assessment and treatment plan with the patient. The patient was provided an opportunity to ask questions and all were answered. The patient agreed with the plan and demonstrated an understanding of the instructions.   The patient was advised to call back or seek an in-person evaluation if the symptoms worsen or if the condition fails to improve as anticipated.  I provided 22 minutes of non-face-to-face time during this encounter.   EliMartyn EhrichP

## 2021-09-29 NOTE — Telephone Encounter (Signed)
Do you know if medicaid will cover either home sleep study or in-lab PSG?

## 2021-09-29 NOTE — Patient Instructions (Signed)
Recommendations - Continue Advair 500 1 puff morning and evening - Resume Spiriva Respimat 1.25 mcg 2 puffs every day in the morning - Resume Singulair 10 mg at bedtime - Take prednisone taper as directed - Focus on side sleeping position or elevate head of bed 30 degrees with wedge pillow to help with snoring/apneas  Referral Pulmonary for sleep consult re: snoring   Follow-up First available for sleep consult/asthma follow-up with labs

## 2021-10-04 NOTE — Telephone Encounter (Signed)
I think it used to be that they only paid for in lab sleep study but things have changed recently

## 2021-10-05 NOTE — Telephone Encounter (Signed)
Ok, she should be seeing me for sleep consult so I will discuss with her then

## 2021-10-15 ENCOUNTER — Other Ambulatory Visit: Payer: Self-pay | Admitting: Nurse Practitioner

## 2021-10-16 ENCOUNTER — Other Ambulatory Visit: Payer: Self-pay

## 2021-10-16 ENCOUNTER — Emergency Department: Payer: Medicaid Other

## 2021-10-16 ENCOUNTER — Emergency Department
Admission: EM | Admit: 2021-10-16 | Discharge: 2021-10-16 | Disposition: A | Discharge status: Home / Self Care | Payer: Medicaid Other | Attending: Emergency Medicine | Admitting: Emergency Medicine

## 2021-10-16 DIAGNOSIS — J4541 Moderate persistent asthma with (acute) exacerbation: Secondary | ICD-10-CM | POA: Insufficient documentation

## 2021-10-16 DIAGNOSIS — R0602 Shortness of breath: Secondary | ICD-10-CM | POA: Diagnosis present

## 2021-10-16 LAB — BASIC METABOLIC PANEL
Anion gap: 7 (ref 5–15)
BUN: 9 mg/dL (ref 6–20)
CO2: 23 mmol/L (ref 22–32)
Calcium: 9 mg/dL (ref 8.9–10.3)
Chloride: 108 mmol/L (ref 98–111)
Creatinine, Ser: 0.65 mg/dL (ref 0.44–1.00)
GFR, Estimated: >60 mL/min (ref >60)
Glucose, Bld: 125 mg/dL — ABNORMAL HIGH (ref 70–99)
Potassium: 3.9 mmol/L (ref 3.5–5.1)
Sodium: 138 mmol/L (ref 135–145)

## 2021-10-16 LAB — CBC
HCT: 39.5 % (ref 36.0–46.0)
Hemoglobin: 12.6 g/dL (ref 12.0–15.0)
MCH: 27.7 pg (ref 26.0–34.0)
MCHC: 31.9 g/dL (ref 30.0–36.0)
MCV: 86.8 fL (ref 80.0–100.0)
Platelets: 269 10*3/uL (ref 150–400)
RBC: 4.55 MIL/uL (ref 3.87–5.11)
RDW: 13.7 % (ref 11.5–15.5)
WBC: 9.1 10*3/uL (ref 4.0–10.5)
nRBC: 0 % (ref 0.0–0.2)

## 2021-10-16 MED ORDER — AZITHROMYCIN 250 MG PO TABS: ORAL_TABLET | ORAL | 0 refills | Status: AC

## 2021-10-16 MED ORDER — IPRATROPIUM-ALBUTEROL 0.5-2.5 (3) MG/3ML IN SOLN
6.0000 mL | Freq: Once | RESPIRATORY_TRACT | Status: AC
  Administered 2021-10-16: 6 mL via RESPIRATORY_TRACT
  Filled 2021-10-16: qty 3

## 2021-10-16 MED ORDER — METHYLPREDNISOLONE SODIUM SUCC 125 MG IJ SOLR
125.0000 mg | Freq: Once | INTRAMUSCULAR | Status: AC
  Administered 2021-10-16: 125 mg via INTRAMUSCULAR
  Filled 2021-10-16: qty 2

## 2021-10-16 MED ORDER — PREDNISONE 10 MG (21) PO TBPK: ORAL_TABLET | ORAL | 0 refills | Status: DC

## 2021-10-16 NOTE — ED Provider Notes (Signed)
[facility] Provider Note   Event Date/Time   First MD Initiated Contact with Patient 10/16/21 [phone]     (approximate) History  Asthma  HPI Sabrina Holder is a 41 y.o. female with a stated past medical history of asthma who presents for worsening shortness of breath over the last 2 days.  Patient denies any fevers but does endorse a cough productive of white sputum that is been worsening over this time.  Patient states that she does have a nebulizer at home but has been unable to find to the mouthpiece to use this machine.  Patient has been attempting to use her albuterol inhaler however states that this has not been improving her symptoms.  Patient does endorse mild chest tightness and shortness of breath that worsens with exertion ROS: Patient currently denies any vision changes, tinnitus, difficulty speaking, facial droop, sore throat, abdominal pain, nausea/vomiting/diarrhea, dysuria, or weakness/numbness/paresthesias in any extremity   Physical Exam  Triage Vital Signs: ED Triage Vitals  Enc Vitals Group     BP 10/16/21 0902 135/83     Pulse Rate 10/16/21 0902 (!) 102     Resp --      Temp 10/16/21 0902 98.7 F (37.1 C)     Temp src --      SpO2 10/16/21 0902 93 %     Weight 10/16/21 0936 263 lb 14.3 oz (119.7 kg)     Height 10/16/21 0936 '5\' 4"'$  (1.626 m)     Head Circumference --      Peak Flow --      Pain Score 10/16/21 0853 3     Pain Loc --      Pain Edu? --      Excl. in [place]? --    Most recent vital signs: Vitals:   10/16/21 0902  BP: 135/83  Pulse: (!) 102  Temp: 98.7 F (37.1 C)  SpO2: 93%   General: Awake, oriented x4. CV:  Good peripheral perfusion.  Resp:  Increased effort.  Inspiratory and expiratory wheezes over bilateral lung fields Abd:  No distention.  Other:  Middle-aged obese African-American female sitting on the side of the bed in mild respiratory distress ED Results / Procedures / Treatments  Labs (all labs ordered  are listed, but only abnormal results are displayed) Labs Reviewed  BASIC METABOLIC PANEL - Abnormal; Notable for the following components:      Result Value   Glucose, Bld 125 (*)    All other components within normal limits  CBC   EKG ED ECG REPORT I, Naaman Plummer, the attending physician, personally viewed and interpreted this ECG. Date: 10/16/2021 EKG Time: 0857 Rate: 98 Rhythm: normal sinus rhythm QRS Axis: normal Intervals: normal ST/T Wave abnormalities: normal Narrative Interpretation: no evidence of acute ischemia RADIOLOGY ED MD interpretation: 2 view chest x-ray interpreted by me shows no evidence of acute abnormalities including no pneumonia, pneumothorax, or widened mediastinum -Agree with radiology assessment Official radiology report(s): DG Chest 2 View  Result Date: 10/16/2021 CLINICAL DATA:  Shortness of breath.  Asthma. EXAM: CHEST - 2 VIEW COMPARISON:  07/15/2021; 05/03/2021; 12/12/2020 FINDINGS: Grossly unchanged cardiac silhouette and mediastinal contours. Unchanged veiling opacities overlying the bilateral lower lungs favored to represent overlying breast tissues. No discrete focal airspace opacities. No pleural effusion or pneumothorax. No evidence of edema. No acute osseous abnormalities. IMPRESSION: No acute cardiopulmonary disease. Electronically Signed   By: Sandi Mariscal M.D.   On: 10/16/2021 09:25   PROCEDURES:  Critical Care performed: No .1-3 Lead EKG Interpretation  Performed by: Naaman Plummer, MD Authorized by: Naaman Plummer, MD     Interpretation: abnormal     ECG rate:  103   ECG rate assessment: tachycardic     Rhythm: sinus tachycardia     Ectopy: none     Conduction: normal    MEDICATIONS ORDERED IN ED: Medications  ipratropium-albuterol (DUONEB) 0.5-2.5 (3) MG/3ML nebulizer solution 6 mL (6 mLs Nebulization Given 10/16/21 1010)  methylPREDNISolone sodium succinate (SOLU-MEDROL) 125 mg/2 mL injection 125 mg (125 mg Intramuscular Given  10/16/21 1011)   IMPRESSION / MDM / ASSESSMENT AND PLAN / ED COURSE  I reviewed the triage vital signs and the nursing notes.                             The patient is on the cardiac monitor to evaluate for evidence of arrhythmia and/or significant heart rate changes. Patient's presentation is most consistent with severe exacerbation of chronic illness. Presentation most consistent with acute asthma exacerbation.  Presentation less concerning for pneumonia, heart failure, foreign body airway obstruction, pulmonary embolism, tamponade, atypical ACS  Reassuring factors: No AMS, silent respirations, belly-breathing, or other sign of impending ventilatory failure. Never intubated or admitted to the hospital for asthma exacerbation.  Workup BC, BMP, and chest x-ray interpreted by me and shows no evidence of acute abnormalities at this time.  EKG shows no evidence of ischemia or arrhythmia  Reassessment: Patient respiratory status improved with breathing treatment.  Disposition: Discharge home with return precautions. Advised to follow up with primary care physician within next 24-48 hours.   FINAL CLINICAL IMPRESSION(S) / ED DIAGNOSES   Final diagnoses:  Moderate persistent asthma with exacerbation   Rx / DC Orders   ED Discharge Orders          Ordered    predniSONE (STERAPRED UNI-PAK 21 TAB) 10 MG (21) TBPK tablet        10/16/21 1112    azithromycin (ZITHROMAX Z-PAK) 250 MG tablet        10/16/21 1112           Note:  This document was prepared using Dragon voice recognition software and may include unintentional dictation errors.   Naaman Plummer, MD 10/16/21 [phone]

## 2021-10-16 NOTE — ED Notes (Signed)
See triage note  Present with some SOB  states hx of asthma    States breathing became worse last pm after being outside  Min relief with inhaler

## 2021-10-16 NOTE — ED Triage Notes (Signed)
Pt come with c/o asthma attack since last night. Pt states increased SOB. Pt states she feels she can't breath.

## 2021-10-18 ENCOUNTER — Other Ambulatory Visit: Payer: Self-pay | Admitting: Nurse Practitioner

## 2021-10-18 DIAGNOSIS — J4541 Moderate persistent asthma with (acute) exacerbation: Secondary | ICD-10-CM

## 2021-10-18 NOTE — Telephone Encounter (Signed)
Patient has a Consult appt with Beth on 11/13/21

## 2021-11-03 ENCOUNTER — Telehealth: Payer: Medicaid Other | Admitting: Physician Assistant

## 2021-11-03 DIAGNOSIS — J4541 Moderate persistent asthma with (acute) exacerbation: Secondary | ICD-10-CM | POA: Diagnosis not present

## 2021-11-03 MED ORDER — PREDNISONE 20 MG PO TABS: 40.0000 mg | ORAL_TABLET | Freq: Every day | ORAL | 0 refills | Status: DC

## 2021-11-03 NOTE — Progress Notes (Signed)
Visit for Asthma  Based on what you have shared with me, it looks like you may have a flare up of your asthma.  Asthma is a chronic (ongoing) lung disease which results in airway obstruction, inflammation and hyper-responsiveness.   Asthma symptoms vary from person to person, with common symptoms including nighttime awakening and decreased ability to participate in normal activities as a result of shortness of breath. It is often triggered by changes in weather, changes in the season, changes in air temperature, or inside (home, school, daycare or work) allergens such as animal dander, mold, mildew, woodstoves or cockroaches.   It can also be triggered by hormonal changes, extreme emotion, physical exertion or an upper respiratory tract illness.     It is important to identify the trigger, and then eliminate or avoid the trigger if possible.   If you have been prescribed medications to be taken on a regular basis, it is important to follow the asthma action plan and to follow guidelines to adjust medication in response to increasing symptoms of decreased peak expiratory flow rate  Treatment: I have prescribed: Prednisone 40mg by mouth per day for 5 - 7 days  HOME CARE Only take medications as instructed by your medical team. Consider wearing a mask or scarf to improve breathing air temperature have been shown to decrease irritation and decrease exacerbations Get rest. Taking a steamy shower or using a humidifier may help nasal congestion sand ease sore throat pain. You can place a towel over your head and breathe in the steam from hot water coming from a faucet. Using a saline nasal spray works much the same way.  Cough drops, hare candies and sore throat lozenges may ease your cough.  Avoid close contacts especially the very you and the elderly Cover your mouth if you cough or  sneeze Always remember to wash your hands.    GET HELP RIGHT AWAY IF: You develop worsening symptoms; breathlessness at rest, drowsy, confused or agitated, unable to speak in full sentences You have coughing fits You develop a severe headache or visual changes You develop shortness of breath, difficulty breathing or start having chest pain Your symptoms persist after you have completed your treatment plan If your symptoms do not improve within 10 days  MAKE SURE YOU Understand these instructions. Will watch your condition. Will get help right away if you are not doing well or get worse.   Your e-visit answers were reviewed by a board certified advanced clinical practitioner to complete your personal care plan, Depending upon the condition, your plan could have included both over the counter or prescription medications.   Please review your pharmacy choice. Your safety is important to us. If you have drug allergies check your prescription carefully.  You can use MyChart to ask questions about today's visit, request a non-urgent  call back, or ask for a work or school excuse for 24 hours related to this e-Visit. If it has been greater than 24 hours you will need to follow up with your provider, or enter a new e-Visit to address those concerns.   You will get an e-mail in the next two days asking about your experience. I hope that your e-visit has been valuable and will speed your recovery. Thank you for using e-visits.  I provided 5 minutes of non face-to-face time during this encounter for chart review and documentation.   

## 2021-11-04 ENCOUNTER — Ambulatory Visit
Admission: EM | Admit: 2021-11-04 | Discharge: 2021-11-04 | Disposition: A | Discharge status: Home / Self Care | Payer: Medicaid Other | Attending: Emergency Medicine | Admitting: Emergency Medicine

## 2021-11-04 ENCOUNTER — Encounter: Payer: Self-pay | Admitting: Emergency Medicine

## 2021-11-04 ENCOUNTER — Telehealth: Payer: Medicaid Other | Admitting: Nurse Practitioner

## 2021-11-04 DIAGNOSIS — J4541 Moderate persistent asthma with (acute) exacerbation: Secondary | ICD-10-CM | POA: Diagnosis not present

## 2021-11-04 MED ORDER — ALBUTEROL SULFATE HFA 108 (90 BASE) MCG/ACT IN AERS: 1.0000 | INHALATION_SPRAY | Freq: Four times a day (QID) | RESPIRATORY_TRACT | 0 refills | Status: DC | PRN

## 2021-11-04 MED ORDER — PROMETHAZINE-DM 6.25-15 MG/5ML PO SYRP: 5.0000 mL | ORAL_SOLUTION | Freq: Four times a day (QID) | ORAL | 0 refills | Status: DC | PRN

## 2021-11-04 MED ORDER — PREDNISONE 20 MG PO TABS: 40.0000 mg | ORAL_TABLET | Freq: Every day | ORAL | 0 refills | Status: DC

## 2021-11-04 MED ORDER — METHYLPREDNISOLONE SODIUM SUCC 125 MG IJ SOLR
125.0000 mg | Freq: Once | INTRAMUSCULAR | Status: AC
  Administered 2021-11-04: 125 mg via INTRAMUSCULAR

## 2021-11-04 MED ORDER — BENZONATATE 100 MG PO CAPS: 100.0000 mg | ORAL_CAPSULE | Freq: Three times a day (TID) | ORAL | 0 refills | Status: DC

## 2021-11-04 NOTE — ED Triage Notes (Signed)
Patient did a virtual visit yesterday for asthma flare up and was given Prednisone yesterday.  Patient still taking her asthma medication and inhalers.  Patient reports ongoing tightness in her chest and SOB. Patient denies fevers.

## 2021-11-04 NOTE — Progress Notes (Signed)
Based on what you shared with me it looks like you have asthma flare up,that should be evaluated in a face to face office visit. You were prescribed prednisone yesterday. If you are ot improving , you need a face to face visit.  NOTE: There will be NO CHARGE for this eVisit   If you are having a true medical emergency please call 911.      For an urgent face to face visit, [place] has six urgent care centers for your convenience:     [facility] [place] [facility] [place] Get Driving Directions [phone] [place], [place] [address]    [facility] [facility]) Get Driving Directions [phone] [place], [place] [address]  [facility] [place] ([place]) Get Driving Directions [phone] [address] [place],  [place]  [address]  [facility] Get Driving Directions [phone] [place], [place], [place] [address]   [facility] Get Driving Directions  [phone] [address] [place], [place] [address]   [facility] [place] Get Driving Directions [phone] [address] [place], [place] [address]  Your MyChart E-visit questionnaire answers were reviewed by a board certified advanced clinical practitioner to complete your personal care plan based on your specific symptoms.  Thank you for using e-Visits.

## 2021-11-04 NOTE — ED Provider Notes (Signed)
[facility]    CSN: [id number] Arrival date & time: 11/04/21  1251      History   Chief Complaint Chief Complaint  Patient presents with   Asthma    HPI Sabrina Holder is a 41 y.o. female.   Patient presents with a productive cough, shortness of breath with exertion and wheezing for 7 days.  Associated headache from coughing.  Symptoms have begun to interfere with sleep and cause increased fatigue.  Believes symptoms were triggered from heat.  attempted to do a video visit 1 day ago but they recommended in person evaluation.  History of asthma, currently using Advair, Spiriva and albuterol, followed by pulmonology.  Has attempted use of Mucinex and Allegra in addition with out relief.  Has completed 4 breathing treatments today.  Past Medical History:  Diagnosis Date   Anemia    Anxiety    Asthma    Depression    Environmental allergies    GERD (gastroesophageal reflux disease)    Renal disorder     Patient Active Problem List   Diagnosis Date Noted   Menorrhagia with regular cycle 12/15/2019   Anemia due to chronic blood loss 12/15/2019   Class 2 obesity due to excess calories with body mass index (BMI) of 39.0 to 39.9 in adult 11/05/2017   Prediabetes 11/05/2017   Moderate asthma with acute exacerbation 03/29/2017    Past Surgical History:  Procedure Laterality Date   ABDOMINAL HYSTERECTOMY     CESAREAN SECTION     X 3   COLONOSCOPY N/A 12/26/2020   Procedure: COLONOSCOPY;  Surgeon: Annamaria Helling, DO;  Location: [facility];  Service: Gastroenterology;  Laterality: N/A;   CYSTOSCOPY N/A 12/15/2019   Procedure: CYSTOSCOPY;  Surgeon: Will Bonnet, MD;  Location: [facility];  Service: Gynecology;  Laterality: N/A;   ESOPHAGOGASTRODUODENOSCOPY N/A 12/26/2020   Procedure: ESOPHAGOGASTRODUODENOSCOPY (EGD);  Surgeon: Annamaria Helling, DO;  Location: [facility];  Service: Gastroenterology;  Laterality: N/A;   TOTAL LAPAROSCOPIC  HYSTERECTOMY WITH SALPINGECTOMY Bilateral 12/15/2019   Procedure: TOTAL LAPAROSCOPIC HYSTERECTOMY WITH SALPINGECTOMY;  Surgeon: Will Bonnet, MD;  Location: [facility];  Service: Gynecology;  Laterality: Bilateral;   TUBAL LIGATION      OB History     Gravida  6   Para  5   Term  5   Preterm      AB  1   Living  5      SAB      IAB  1   Ectopic      Multiple      Live Births  5            Home Medications    Prior to Admission medications   Medication Sig Start Date End Date Taking? Authorizing Provider  albuterol (VENTOLIN HFA) 108 (90 Base) MCG/ACT inhaler Inhale 1-2 puffs into the lungs every 6 (six) hours as needed for wheezing or shortness of breath. 09/09/21  Yes Martin, Mary-Margaret, FNP  buPROPion (WELLBUTRIN XL) 300 MG 24 hr tablet Take 300 mg by mouth daily. 08/11/19  Yes [provider]  escitalopram (LEXAPRO) 20 MG tablet Take 20 mg by mouth daily.  09/03/17  Yes [provider]  fluticasone-salmeterol (ADVAIR) 500-50 MCG/ACT AEPB Inhale 1 puff into the lungs in the morning and at bedtime. 09/29/21  Yes Martyn Ehrich, NP  montelukast (SINGULAIR) 10 MG tablet Take 1 tablet (10 mg total) by mouth daily. 09/29/21  Yes Martyn Ehrich,  NP  omeprazole (PRILOSEC) 20 MG capsule Take 1 capsule (20 mg total) by mouth 2 (two) times daily before a meal. 09/09/21  Yes Hassell Done, Mary-Margaret, FNP  Tiotropium Bromide Monohydrate (SPIRIVA RESPIMAT) 1.25 MCG/ACT AERS Take 2 puffs by mouth once daily in the morning 09/29/21  Yes Martyn Ehrich, NP  albuterol (PROVENTIL) (2.5 MG/3ML) 0.083% nebulizer solution Take 3 mLs (2.5 mg total) by nebulization every 6 (six) hours as needed for wheezing or shortness of breath. 05/03/21   Menshew, Dannielle Karvonen, PA-C  cetirizine (ZYRTEC) 10 MG tablet Take by mouth.    [provider]  fexofenadine-pseudoephedrine (ALLEGRA-D) 60-120 MG 12 hr tablet Take 1 tablet by mouth every 12 (twelve) hours.  07/12/20   Caccavale, Sophia, PA-C  fluticasone (FLONASE) 50 MCG/ACT nasal spray Place 2 sprays into both nostrils daily. 09/09/21   Hassell Done, Mary-Margaret, FNP  hydrocortisone (ANUSOL-HC) 25 MG suppository Place 25 mg rectally 2 (two) times daily.    [provider]  ipratropium (ATROVENT) 0.06 % nasal spray Place 2 sprays into both nostrils 4 (four) times daily. 03/27/21   Margarette Canada, NP  predniSONE (DELTASONE) 20 MG tablet Take 2 tablets (40 mg total) by mouth daily with breakfast. 11/03/21   Mar Daring, PA-C  promethazine-phenylephrine (PROMETHAZINE VC) 6.25-5 MG/5ML SYRP Take 5 mLs by mouth every 6 (six) hours as needed for congestion. 03/27/21   Margarette Canada, NP  ferrous sulfate 325 (65 FE) MG tablet Take 1 tablet (325 mg total) by mouth daily. 10/16/19 04/02/20  Melynda Ripple, MD  sertraline (ZOLOFT) 50 MG tablet Take 1 tablet (50 mg total) by mouth daily. 01/11/16 12/08/18  Norval Gable, MD    Family History Family History  Problem Relation Age of Onset   Asthma Mother    Gout Father    Cancer Maternal Grandmother 39       unknown type   Uterine cancer Neg Hx    Ovarian cancer Neg Hx    Breast cancer Neg Hx     Social History Social History   Tobacco Use   Smoking status: Former    Packs/day: 0.50    Years: 20.00    Total pack years: 10.00    Types: Cigarettes    Quit date: 2022    Years since quitting: 1.5   Smokeless tobacco: Never   Tobacco comments:    smokes one cigarette occassionally  Vaping Use   Vaping Use: Never used  Substance Use Topics   Alcohol use: Yes    Comment: occasional (once weekly)   Drug use: No     Allergies   Patient has no known allergies.   Review of Systems Review of Systems  Constitutional: Negative.   HENT: Negative.    Respiratory:  Positive for cough, shortness of breath and wheezing. Negative for apnea, choking, chest tightness and stridor.      Physical Exam Triage Vital Signs ED Triage Vitals  Enc  Vitals Group     BP 11/04/21 1311 (!) 161/81     Pulse Rate 11/04/21 1311 (!) 112     Resp 11/04/21 1311 16     Temp 11/04/21 1311 98.6 F (37 C)     Temp Source 11/04/21 1311 Oral     SpO2 11/04/21 1311 95 %     Weight 11/04/21 1308 263 lb 14.3 oz (119.7 kg)     Height 11/04/21 1308 '5\' 4"'$  (1.626 m)     Head Circumference --      Peak  Flow --      Pain Score 11/04/21 1308 9     Pain Loc --      Pain Edu? --      Excl. in [place]? --    No data found.  Updated Vital Signs BP (!) 161/81 (BP Location: Left Arm)   Pulse (!) 112   Temp 98.6 F (37 C) (Oral)   Resp 16   Ht '5\' 4"'$  (1.626 m)   Wt 263 lb 14.3 oz (119.7 kg)   LMP 10/20/2019 (Approximate)   SpO2 95%   BMI 45.30 kg/m   Visual Acuity Right Eye Distance:   Left Eye Distance:   Bilateral Distance:    Right Eye Near:   Left Eye Near:    Bilateral Near:     Physical Exam Constitutional:      Appearance: Normal appearance.  Eyes:     Extraocular Movements: Extraocular movements intact.  Cardiovascular:     Rate and Rhythm: Normal rate and regular rhythm.     Pulses: Normal pulses.     Heart sounds: Normal heart sounds.  Pulmonary:     Effort: Pulmonary effort is normal.     Breath sounds: Wheezing present.  Skin:    General: Skin is warm and dry.  Neurological:     Mental Status: She is alert and oriented to person, place, and time. Mental status is at baseline.  Psychiatric:        Mood and Affect: Mood normal.        Behavior: Behavior normal.      UC Treatments / Results  Labs (all labs ordered are listed, but only abnormal results are displayed) Labs Reviewed - No data to display  EKG   Radiology No results found.  Procedures Procedures (including critical care time)  Medications Ordered in UC Medications - No data to display  Initial Impression / Assessment and Plan / UC Course  I have reviewed the triage vital signs and the nursing notes.  Pertinent labs & imaging results that were  available during my care of the patient were reviewed by me and considered in my medical decision making (see chart for details).  Moderate persistent asthma with exacerbation  Vital signs are stable, tachycardia noted in triage however patient endorses that she is jittery from breathing treatments this morning, O2 saturations 95% on room air and wheezing is heard to all lobes, will defer additional breathing treatment at this time and methylprednisolone 125 mg given in office as well as prednisone 40 mg burst prescribed, refilled albuterol inhaler and prescribed Tessalon and Promethazine DM for management of coughing, patient endorses that she has upcoming in person evaluation by pulmonology, given strict precautions that if symptoms persist or worsen she is to go to the nearest emergency department for evaluation for IV medications Final Clinical Impressions(s) / UC Diagnoses   Final diagnoses:  None   Discharge Instructions   None    ED Prescriptions   None    PDMP not reviewed this encounter.   Hans Eden, NP 11/04/21 1332

## 2021-11-04 NOTE — Discharge Instructions (Addendum)
Today you are being treated for inflammation to your upper airways  Begin use of prednisone every morning with food for 5 days to help reduce inflammation   May use albuterol inhaler taking 2 puffs every 4 hours as needed to help calm shortness of breath and wheezing  May use tessalon pill every 8 hours as needed for cough   May use promethazine DM for coughing and additional comfort, be mindful this medication may make you drowsy  For worsening signs of breathing please go to the nearest emergency department for evaluation  In addition:  Maintaining adequate hydration may help to thin secretions and soothe the respiratory mucosa   Warm Liquids- Ingestion of warm liquids may have a soothing effect on the respiratory mucosa, increase the flow of nasal mucus, and loosen respiratory secretions, making them easier to remove  May try honey (2.5 to 5 mL [0.5 to 1 teaspoon]) can be given straight or diluted in liquid (juice). Corn syrup may be substituted if honey is not available.

## 2021-11-13 ENCOUNTER — Other Ambulatory Visit
Admission: RE | Admit: 2021-11-13 | Discharge: 2021-11-13 | Disposition: A | Discharge status: Home / Self Care | Payer: Medicaid Other | Source: Ambulatory Visit | Attending: Primary Care | Admitting: Primary Care

## 2021-11-13 ENCOUNTER — Ambulatory Visit (INDEPENDENT_AMBULATORY_CARE_PROVIDER_SITE_OTHER): Payer: Medicaid Other | Admitting: Primary Care

## 2021-11-13 ENCOUNTER — Encounter: Payer: Self-pay | Admitting: Primary Care

## 2021-11-13 VITALS — BP 138/84 | HR 103 | Temp 98.6°F | Ht 64.0 in | Wt 274.6 lb

## 2021-11-13 DIAGNOSIS — R0683 Snoring: Secondary | ICD-10-CM | POA: Diagnosis not present

## 2021-11-13 DIAGNOSIS — J4541 Moderate persistent asthma with (acute) exacerbation: Secondary | ICD-10-CM

## 2021-11-13 DIAGNOSIS — K219 Gastro-esophageal reflux disease without esophagitis: Secondary | ICD-10-CM

## 2021-11-13 LAB — CBC WITH DIFFERENTIAL/PLATELET
Abs Immature Granulocytes: 0.08 10*3/uL — ABNORMAL HIGH (ref 0.00–0.07)
Basophils Absolute: 0 10*3/uL (ref 0.0–0.1)
Basophils Relative: 0 %
Eosinophils Absolute: 0.4 10*3/uL (ref 0.0–0.5)
Eosinophils Relative: 5 %
HCT: 40.3 % (ref 36.0–46.0)
Hemoglobin: 12.7 g/dL (ref 12.0–15.0)
Immature Granulocytes: 1 %
Lymphocytes Relative: 30 %
Lymphs Abs: 2.6 10*3/uL (ref 0.7–4.0)
MCH: 27.2 pg (ref 26.0–34.0)
MCHC: 31.5 g/dL (ref 30.0–36.0)
MCV: 86.3 fL (ref 80.0–100.0)
Monocytes Absolute: 0.7 10*3/uL (ref 0.1–1.0)
Monocytes Relative: 7 %
Neutro Abs: 5.1 10*3/uL (ref 1.7–7.7)
Neutrophils Relative %: 57 %
Platelets: 297 10*3/uL (ref 150–400)
RBC: 4.67 MIL/uL (ref 3.87–5.11)
RDW: 13.6 % (ref 11.5–15.5)
WBC: 9 10*3/uL (ref 4.0–10.5)
nRBC: 0 % (ref 0.0–0.2)

## 2021-11-13 MED ORDER — ALBUTEROL SULFATE HFA 108 (90 BASE) MCG/ACT IN AERS: 1.0000 | INHALATION_SPRAY | Freq: Four times a day (QID) | RESPIRATORY_TRACT | 2 refills | Status: DC | PRN

## 2021-11-13 MED ORDER — PREDNISONE 10 MG PO TABS: ORAL_TABLET | ORAL | 0 refills | Status: DC

## 2021-11-13 NOTE — Patient Instructions (Addendum)
Recommendations - Continue Advair 1 puff morning and evening - Continue Spiriva 2 puffs once daily in the morning - Continue montelukast (Singulair) 10 mg at bedtime - Resume omeprazole 20 mg by mouth morning and evening before meal - Do not resume smoking, work on weight loss as able  Rx - Prednisone taper as directed  Orders - CBC with differential, IgE - Home sleep study re: snoring   Follow-up - Please call to schedule follow-up 1 to 2 weeks after completing home sleep study to review results and treatment options if needed

## 2021-11-13 NOTE — Progress Notes (Signed)
Received initial 2 pfizer vaccines.

## 2021-11-13 NOTE — Progress Notes (Signed)
$'@Patient'q$  ID: [name], female    DOB: Feb 14, 1981, 41 y.o.   MRN: [id number]  Chief Complaint  Patient presents with   Consult    Referring provider: [facility]  HPI: 41 year old female, current smoker. PMH significant for mild intermittent asthma. Patient of Dr. Mortimer Fries, last seen for virtual visit in December 2021.  Previous [facility] pulmonary encounter:  03/08/2020  Patient contacted today for acute televisit. States that her breathing has been worse since Thursday 03/03/20. She has an associated dry cough. Cold weather exacerbates her asthma symptoms. She is compliant with Advair 500, Spiriva 1.78mg and Singulair. She has been using albuterol rescue inhaler 10 times a day with temporary improvement.  She does improve with oral prednisone, she last received prednsione '20mg'$  x 5 days from her PCP in November 2021. She works at the [employer]    09/29/2021 Patient contacted today for acute overview.  She reports symptoms of shortness of breath at night.  She has woken her self up at night gasping for air. She believes she has sleep apnea.  She has never had a sleep study. She is concerned about cost d/t having medicaid.  She also reports symptoms of increased cough and wheezing last several weeks.  She is currently using Advair 500-512m 1 puff twice daily.  She is not currently taking Spiriva or Singulair.  She has associated allergy symptoms including nasal congestion/postnasal drip.   Asthma - Mild exacerbation.  Sending in prednisone taper.  Advised patient resume Singulair 10 mg at bedtime and Spiriva Respimat 1.25 mcg 2 puffs once daily.  Continue Advair 500-5067m1 puff twice daily.  Recommend checking CBC with differential and respiratory allergy panel at follow-up.    11/13/2021- Interim hx Patient presents today for asthma follow-up/sleep consult. She was seen for virtual visit back in June for mild asthma exacerbation. She is maintained on  Advair 500-12m55mne puff twice daily. We recently added Spiriva respimat 1.25mc65mo puffs daily to her regimen and advised that she resume Singulair.  She saw some improvement in her breathing with recent prednisone taper but continues to have upper airway wheezing.  She is not currently taking omeprazole as she thought this is what caused her to have a GI bleed.  He is compliant with both Advair and Spiriva. She has resumed Singulair as instructed. Chest x-ray in mid July that showed unchanged opacities overlying bilateral lower lungs, favored to represent overlying breast tissue.  No discrete focal airspace opacities.  Evidence of edema.   She has symptoms of loud snoring and witnessed apnea. She has woken herself up on numerous occasional snoring/gasping for air. She has associated daytime sleepiness. She has had no formal sleep studies, she has been ordered for study in the past but could not afford her copay. Typical bedtime is between 10-11pm. It can take her several hours to fall asleep. She wakes up 4-5 times a night. She starts her day at 6:30am. Denies narcolepsy, cataplexy or sleep walking.   Sleep questionnaire Symptoms- Loud snoring, waking up gasping for air, witnessed apnea Prior sleep study- None  Bedtime- 10-11pm Time to fall asleep- 3 hours  Nocturnal awakenings- 4-5 times Out of bed/start of day- 6:30am  Weight changes- 50 lbs up Do you operate heavy machinery- No Do you currently wear CPAP- No Do you current wear oxygen- No Epworth- 13 Medications- Wellbutrin XL, Lexapro '20mg'$  daily    No Known Allergies   There is no  immunization history on file for this patient.  Past Medical History:  Diagnosis Date   Anemia    Anxiety    Asthma    Depression    Environmental allergies    GERD (gastroesophageal reflux disease)    Renal disorder     Tobacco History: Social History   Tobacco Use  Smoking Status Former   Packs/day: 0.50   Years: 20.00   Total pack years:  10.00   Types: Cigarettes   Quit date: 11/01/2021   Years since quitting: 0.0  Smokeless Tobacco Never  Tobacco Comments   Quit in 2022 and recently started back.  Stopped again a week and a half ago on wellbutrin.  11/13/2021 hfb   Counseling given: Not Answered Tobacco comments: Quit in 2022 and recently started back.  Stopped again a week and a half ago on wellbutrin.  11/13/2021 hfb   Outpatient Medications Prior to Visit  Medication Sig Dispense Refill   albuterol (PROVENTIL) (2.5 MG/3ML) 0.083% nebulizer solution Take 3 mLs (2.5 mg total) by nebulization every 6 (six) hours as needed for wheezing or shortness of breath. 75 mL 0   benzonatate (TESSALON) 100 MG capsule Take 1 capsule (100 mg total) by mouth every 8 (eight) hours. 21 capsule 0   buPROPion (WELLBUTRIN XL) 300 MG 24 hr tablet Take 300 mg by mouth daily.     escitalopram (LEXAPRO) 20 MG tablet Take 20 mg by mouth daily.   1   fexofenadine (ALLEGRA) 180 MG tablet Take 180 mg by mouth daily.     fexofenadine-pseudoephedrine (ALLEGRA-D) 60-120 MG 12 hr tablet Take 1 tablet by mouth every 12 (twelve) hours. 30 tablet 0   fluticasone (FLONASE) 50 MCG/ACT nasal spray Place 2 sprays into both nostrils daily. 16 g 0   fluticasone-salmeterol (ADVAIR) 500-50 MCG/ACT AEPB Inhale 1 puff into the lungs in the morning and at bedtime. 60 each 11   ipratropium (ATROVENT) 0.06 % nasal spray Place 2 sprays into both nostrils 4 (four) times daily. 15 mL 12   montelukast (SINGULAIR) 10 MG tablet Take 1 tablet (10 mg total) by mouth daily. 30 tablet 11   omeprazole (PRILOSEC) 20 MG capsule Take 1 capsule (20 mg total) by mouth 2 (two) times daily before a meal. 60 capsule 2   Tiotropium Bromide Monohydrate (SPIRIVA RESPIMAT) 1.25 MCG/ACT AERS Take 2 puffs by mouth once daily in the morning 4 g 11   albuterol (VENTOLIN HFA) 108 (90 Base) MCG/ACT inhaler Inhale 1-2 puffs into the lungs every 6 (six) hours as needed for wheezing or shortness of  breath. 18 g 0   promethazine-dextromethorphan (PROMETHAZINE-DM) 6.25-15 MG/5ML syrup Take 5 mLs by mouth 4 (four) times daily as needed for cough. (Patient not taking: Reported on 11/13/2021) 118 mL 0   promethazine-phenylephrine (PROMETHAZINE VC) 6.25-5 MG/5ML SYRP Take 5 mLs by mouth every 6 (six) hours as needed for congestion. (Patient not taking: Reported on 11/13/2021) 180 mL 0   cetirizine (ZYRTEC) 10 MG tablet Take by mouth. (Patient not taking: Reported on 11/13/2021)     hydrocortisone (ANUSOL-HC) 25 MG suppository Place 25 mg rectally 2 (two) times daily. (Patient not taking: Reported on 11/13/2021)     predniSONE (DELTASONE) 20 MG tablet Take 2 tablets (40 mg total) by mouth daily. (Patient not taking: Reported on 11/13/2021) 10 tablet 0   No facility-administered medications prior to visit.   Review of Systems  Review of Systems  Constitutional:  Positive for fatigue.  HENT: Negative.  Respiratory:  Positive for apnea and wheezing.   Psychiatric/Behavioral:  Positive for sleep disturbance.     Physical Exam  BP 138/84 (BP Location: Left Arm, Patient Position: Sitting, Cuff Size: Large)   Pulse (!) 103   Temp 98.6 F (37 C) (Oral)   Ht '5\' 4"'$  (1.626 m)   Wt 274 lb 9.6 oz (124.6 kg)   LMP 10/20/2019 (Approximate)   SpO2 97%   BMI 47.13 kg/m  Physical Exam Constitutional:      General: She is not in acute distress.    Appearance: Normal appearance. She is obese. She is not ill-appearing.  HENT:     Head: Normocephalic and atraumatic.     Mouth/Throat:     Mouth: Mucous membranes are moist.     Pharynx: Oropharynx is clear.  Cardiovascular:     Rate and Rhythm: Normal rate and regular rhythm.  Pulmonary:     Effort: Pulmonary effort is normal.     Breath sounds: Wheezing present.  Musculoskeletal:        General: Normal range of motion.  Skin:    General: Skin is warm and dry.  Neurological:     General: No focal deficit present.     Mental Status: She is alert  and oriented to person, place, and time. Mental status is at baseline.  Psychiatric:        Mood and Affect: Mood normal.        Behavior: Behavior normal.        Thought Content: Thought content normal.        Judgment: Judgment normal.      Lab Results:  CBC    Component Value Date/Time   WBC 9.1 10/16/2021 0855   RBC 4.55 10/16/2021 0855   HGB 12.6 10/16/2021 0855   HGB 12.9 02/26/2014 0948   HCT 39.5 10/16/2021 0855   HCT 40.0 02/26/2014 0948   PLT 269 10/16/2021 0855   PLT 267 02/26/2014 0948   MCV 86.8 10/16/2021 0855   MCV 88 02/26/2014 0948   MCH 27.7 10/16/2021 0855   MCHC 31.9 10/16/2021 0855   RDW 13.7 10/16/2021 0855   RDW 14.5 02/26/2014 0948   LYMPHSABS 1.4 10/16/2019 0922   LYMPHSABS 2.0 02/26/2014 0948   MONOABS 0.5 10/16/2019 0922   MONOABS 0.3 02/26/2014 0948   EOSABS 0.5 10/16/2019 0922   EOSABS 0.3 02/26/2014 0948   BASOSABS 0.0 10/16/2019 0922   BASOSABS 0.0 02/26/2014 0948    BMET    Component Value Date/Time   NA 138 10/16/2021 0855   NA 139 02/26/2014 0948   K 3.9 10/16/2021 0855   K 4.0 02/26/2014 0948   CL 108 10/16/2021 0855   CL 104 02/26/2014 0948   CO2 23 10/16/2021 0855   CO2 24 02/26/2014 0948   GLUCOSE 125 (H) 10/16/2021 0855   GLUCOSE 92 02/26/2014 0948   BUN 9 10/16/2021 0855   BUN 11 02/26/2014 0948   CREATININE 0.65 10/16/2021 0855   CREATININE 0.83 02/26/2014 0948   CALCIUM 9.0 10/16/2021 0855   CALCIUM 8.8 02/26/2014 0948   GFRNONAA >60 10/16/2021 0855   GFRNONAA >60 02/26/2014 0948   GFRAA >60 12/11/2019 1001   GFRAA >60 02/26/2014 0948    BNP    Component Value Date/Time   BNP 8.0 03/09/2017 0904    ProBNP No results found for: "PROBNP"  Imaging: DG Chest 2 View  Result Date: 10/16/2021 CLINICAL DATA:  Shortness of breath.  Asthma. EXAM: CHEST - 2  VIEW COMPARISON:  07/15/2021; 05/03/2021; 12/12/2020 FINDINGS: Grossly unchanged cardiac silhouette and mediastinal contours. Unchanged veiling opacities  overlying the bilateral lower lungs favored to represent overlying breast tissues. No discrete focal airspace opacities. No pleural effusion or pneumothorax. No evidence of edema. No acute osseous abnormalities. IMPRESSION: No acute cardiopulmonary disease. Electronically Signed   By: Sandi Mariscal M.D.   On: 10/16/2021 09:25     Assessment & Plan:   Moderate asthma with acute exacerbation - Poorly controlled; Continues to have wheezing symptoms. She does see temporary improvement with oral prednisone. We will check CBC with diff and IgE, she may benefit from adding biologic for treatment of uncontrolled asthma with recurrent exacerbations. Continue Advair 500-28mg one puff morning and evening; Spiriva Respimat 1.266m 2 puffs once daily in the morning, PRN albuterol HFA 2 puffs q4-6 hours and montelukast (Singulair) 10 mg at bedtime. Rx prednisone taper '40mg'$  x 2 days, '30mg'$  x 2 days, '20mg'$  x 2 days, '10mg'$  x 2 days.   Loud snoring - Patient has symptoms of loud snoring, witnessed apnea, restless sleep and daytime sleepiness.  Epworth score 13.  BMI 47.  I have a strong suspicion that patient has underlying sleep apnea, needs home sleep study to evaluate.  Reviewed risks of untreated sleep apnea including A-fib, pulmonary hypertension, stroke and diabetes.  We also discussed treatment options including loss, oral appliance, CPAP therapy or referral to ENT for possible surgical options.  Patient does have Medicaid and has had had difficulty in the past pain for sleep study.  She has obtained a CPAP from a friend that is available if needed.  Encourage side sleeping position and weight loss efforts.  Advised against driving if experiencing excessive daytime sleepiness or fatigue.  Follow-up 1 to 2 weeks after completing sleep study to review results and treatment options if needed.  GERD (gastroesophageal reflux disease) - Resume omeprazole 20 mg by mouth morning and evening before meal    ElMartyn EhrichNP 11/13/2021

## 2021-11-13 NOTE — Assessment & Plan Note (Addendum)
-   Poorly controlled; Continues to have wheezing symptoms. She does see temporary improvement with oral prednisone. We will check CBC with diff and IgE, she may benefit from adding biologic for treatment of uncontrolled asthma with recurrent exacerbations. Continue Advair 500-70mg one puff morning and evening; Spiriva Respimat 1.230m 2 puffs once daily in the morning, PRN albuterol HFA 2 puffs q4-6 hours and montelukast (Singulair) 10 mg at bedtime. Rx prednisone taper '40mg'$  x 2 days, '30mg'$  x 2 days, '20mg'$  x 2 days, '10mg'$  x 2 days.

## 2021-11-13 NOTE — Assessment & Plan Note (Signed)
-   Resume omeprazole 20 mg by mouth morning and evening before meal

## 2021-11-13 NOTE — Assessment & Plan Note (Signed)
-   Patient has symptoms of loud snoring, witnessed apnea, restless sleep and daytime sleepiness.  Epworth score 13.  BMI 47.  I have a strong suspicion that patient has underlying sleep apnea, needs home sleep study to evaluate.  Reviewed risks of untreated sleep apnea including A-fib, pulmonary hypertension, stroke and diabetes.  We also discussed treatment options including loss, oral appliance, CPAP therapy or referral to ENT for possible surgical options.  Patient does have Medicaid and has had had difficulty in the past pain for sleep study.  She has obtained a CPAP from a friend that is available if needed.  Encourage side sleeping position and weight loss efforts.  Advised against driving if experiencing excessive daytime sleepiness or fatigue.  Follow-up 1 to 2 weeks after completing sleep study to review results and treatment options if needed.

## 2021-11-16 LAB — IGE: IgE (Immunoglobulin E), Serum: 13 IU/mL (ref 6–495)

## 2021-11-28 NOTE — Progress Notes (Signed)
Please let patient know her labs showed elevated eosinophils. If asthma keeps flaring we will talk about starting her on biologic at follow-up after sleep study

## 2021-12-11 ENCOUNTER — Telehealth: Payer: BC Managed Care – PPO | Admitting: Physician Assistant

## 2021-12-11 ENCOUNTER — Encounter: Payer: Medicaid Other | Admitting: Physician Assistant

## 2021-12-11 ENCOUNTER — Encounter: Payer: Self-pay | Admitting: Physician Assistant

## 2021-12-11 ENCOUNTER — Telehealth: Payer: Self-pay | Admitting: Primary Care

## 2021-12-11 DIAGNOSIS — K219 Gastro-esophageal reflux disease without esophagitis: Secondary | ICD-10-CM

## 2021-12-11 DIAGNOSIS — J4541 Moderate persistent asthma with (acute) exacerbation: Secondary | ICD-10-CM

## 2021-12-11 DIAGNOSIS — R0982 Postnasal drip: Secondary | ICD-10-CM

## 2021-12-11 MED ORDER — PANTOPRAZOLE SODIUM 40 MG PO TBEC: 40.0000 mg | DELAYED_RELEASE_TABLET | Freq: Every day | ORAL | 1 refills | Status: DC

## 2021-12-11 MED ORDER — IPRATROPIUM BROMIDE 0.03 % NA SOLN: 2.0000 | Freq: Two times a day (BID) | NASAL | 0 refills | Status: DC

## 2021-12-11 MED ORDER — PREDNISONE 20 MG PO TABS: 20.0000 mg | ORAL_TABLET | Freq: Every day | ORAL | 0 refills | Status: DC

## 2021-12-11 NOTE — Telephone Encounter (Signed)
Called and spoke to patient. C/o SOB with exertion, prod cough with white sputum and wheezing x4d Denied f/c/s or additional sx.  Using duoneb 2 vials Q3H, albuterol HFA Q4H, spiriva once daily and Advair BID. No recent covid test.   Dr. Mortimer Fries, please advise. Thanks

## 2021-12-11 NOTE — Progress Notes (Signed)
Virtual Visit Consent   Sabrina Holder, you are scheduled for a virtual visit with a [facility] provider today. Just as with appointments in the office, your consent must be obtained to participate. Your consent will be active for this visit and any virtual visit you may have with one of our providers in the next 365 days. If you have a MyChart account, a copy of this consent can be sent to you electronically.  As this is a virtual visit, video technology does not allow for your provider to perform a traditional examination. This may limit your provider's ability to fully assess your condition. If your provider identifies any concerns that need to be evaluated in person or the need to arrange testing (such as labs, EKG, etc.), we will make arrangements to do so. Although advances in technology are sophisticated, we cannot ensure that it will always work on either your end or our end. If the connection with a video visit is poor, the visit may have to be switched to a telephone visit. With either a video or telephone visit, we are not always able to ensure that we have a secure connection.  By engaging in this virtual visit, you consent to the provision of healthcare and authorize for your insurance to be billed (if applicable) for the services provided during this visit. Depending on your insurance coverage, you may receive a charge related to this service.  I need to obtain your verbal consent now. Are you willing to proceed with your visit today? Elsie Baynes has provided verbal consent on 12/11/2021 for a virtual visit (video or telephone). Mar Daring, PA-C  Date: 12/11/2021 1:22 PM  Virtual Visit via Video Note   I, Mar Daring, connected with  Sabrina Holder  ([id number], 12-24-1980) on 12/11/21 at  1:15 PM EDT by a video-enabled telemedicine application and verified that I am speaking with the correct person using two identifiers.  Location: Patient: Virtual  Visit Location Patient: Mobile Provider: Virtual Visit Location Provider: Home Office   I discussed the limitations of evaluation and management by telemedicine and the availability of in person appointments. The patient expressed understanding and agreed to proceed.    History of Present Illness: Sabrina Holder is a 41 y.o. who identifies as a female who was assigned female at birth, and is being seen today for cough, asthma, post nasal drainage.  HPI: Asthma She complains of chest tightness, cough, frequent throat clearing, shortness of breath and sputum production. There is no difficulty breathing or hoarse voice. This is a new problem. The current episode started 1 to 4 weeks ago. The problem occurs constantly. The problem has been gradually worsening. The cough is productive of sputum. Associated symptoms include postnasal drip. Her past medical history is significant for asthma.    Feels mucus in her throat and feels like she is constantly clearing that and worried it is going to trigger her asthma. Currently she is not having breathing issues. Reports post nasal drainage and not well controlled GERD symptoms most likely triggering her mucus production in the throat. She does have an appt for consideration of allergy shots, but reports that appt is not until December.   Problems:  Patient Active Problem List   Diagnosis Date Noted   Loud snoring 11/13/2021   GERD (gastroesophageal reflux disease) 11/13/2021   Menorrhagia with regular cycle 12/15/2019   Anemia due to chronic blood loss 12/15/2019   Class 2 obesity due to excess  calories with body mass index (BMI) of 39.0 to 39.9 in adult 11/05/2017   Prediabetes 11/05/2017   Moderate asthma with acute exacerbation 03/29/2017    Allergies: No Known Allergies Medications:  Current Outpatient Medications:    ipratropium (ATROVENT) 0.03 % nasal spray, Place 2 sprays into both nostrils every 12 (twelve) hours., Disp: 30 mL, Rfl: 0    pantoprazole (PROTONIX) 40 MG tablet, Take 1 tablet (40 mg total) by mouth daily., Disp: 30 tablet, Rfl: 1   predniSONE (DELTASONE) 20 MG tablet, Take 1 tablet (20 mg total) by mouth daily with breakfast., Disp: 7 tablet, Rfl: 0   albuterol (PROVENTIL) (2.5 MG/3ML) 0.083% nebulizer solution, Take 3 mLs (2.5 mg total) by nebulization every 6 (six) hours as needed for wheezing or shortness of breath., Disp: 75 mL, Rfl: 0   albuterol (VENTOLIN HFA) 108 (90 Base) MCG/ACT inhaler, Inhale 1-2 puffs into the lungs every 6 (six) hours as needed for wheezing or shortness of breath., Disp: 18 g, Rfl: 2   benzonatate (TESSALON) 100 MG capsule, Take 1 capsule (100 mg total) by mouth every 8 (eight) hours., Disp: 21 capsule, Rfl: 0   buPROPion (WELLBUTRIN XL) 300 MG 24 hr tablet, Take 300 mg by mouth daily., Disp: , Rfl:    escitalopram (LEXAPRO) 20 MG tablet, Take 20 mg by mouth daily. , Disp: , Rfl: 1   fexofenadine (ALLEGRA) 180 MG tablet, Take 180 mg by mouth daily., Disp: , Rfl:    fexofenadine-pseudoephedrine (ALLEGRA-D) 60-120 MG 12 hr tablet, Take 1 tablet by mouth every 12 (twelve) hours., Disp: 30 tablet, Rfl: 0   fluticasone (FLONASE) 50 MCG/ACT nasal spray, Place 2 sprays into both nostrils daily., Disp: 16 g, Rfl: 0   fluticasone-salmeterol (ADVAIR) 500-50 MCG/ACT AEPB, Inhale 1 puff into the lungs in the morning and at bedtime., Disp: 60 each, Rfl: 11   montelukast (SINGULAIR) 10 MG tablet, Take 1 tablet (10 mg total) by mouth daily., Disp: 30 tablet, Rfl: 11   promethazine-dextromethorphan (PROMETHAZINE-DM) 6.25-15 MG/5ML syrup, Take 5 mLs by mouth 4 (four) times daily as needed for cough. (Patient not taking: Reported on 11/13/2021), Disp: 118 mL, Rfl: 0   promethazine-phenylephrine (PROMETHAZINE VC) 6.25-5 MG/5ML SYRP, Take 5 mLs by mouth every 6 (six) hours as needed for congestion. (Patient not taking: Reported on 11/13/2021), Disp: 180 mL, Rfl: 0   Tiotropium Bromide Monohydrate (SPIRIVA RESPIMAT)  1.25 MCG/ACT AERS, Take 2 puffs by mouth once daily in the morning, Disp: 4 g, Rfl: 11  Observations/Objective: Patient is well-developed, well-nourished in no acute distress.  Resting comfortably  Head is normocephalic, atraumatic.  No labored breathing.  Speech is clear and coherent with logical content.  Patient is alert and oriented at baseline.    Assessment and Plan: 1. Post-nasal drainage - ipratropium (ATROVENT) 0.03 % nasal spray; Place 2 sprays into both nostrils every 12 (twelve) hours.  Dispense: 30 mL; Refill: 0  2. Gastroesophageal reflux disease without esophagitis - pantoprazole (PROTONIX) 40 MG tablet; Take 1 tablet (40 mg total) by mouth daily.  Dispense: 30 tablet; Refill: 1  3. Moderate persistent asthma with acute exacerbation - predniSONE (DELTASONE) 20 MG tablet; Take 1 tablet (20 mg total) by mouth daily with breakfast.  Dispense: 7 tablet; Refill: 0  - Will add Ipratropium for nasal congestion and drainage; continue flonase - Stop Omeprazole (Prilosec) and start Pantoprazole for GERD - 7 day prednisone for asthma - Continue daily inhalers as needed - Seek in person evaluation if worsening or  fails to improve    Follow Up Instructions: I discussed the assessment and treatment plan with the patient. The patient was provided an opportunity to ask questions and all were answered. The patient agreed with the plan and demonstrated an understanding of the instructions.  A copy of instructions were sent to the patient via MyChart unless otherwise noted below.    The patient was advised to call back or seek an in-person evaluation if the symptoms worsen or if the condition fails to improve as anticipated.  Time:  I spent 10 minutes with the patient via telehealth technology discussing the above problems/concerns.    Mar Daring, PA-C

## 2021-12-11 NOTE — Patient Instructions (Signed)
Sabrina Holder, thank you for joining Mar Daring, PA-C for today's virtual visit.  While this provider is not your primary care provider (PCP), if your PCP is located in our provider database this encounter information will be shared with them immediately following your visit.  Consent: (Patient) Sabrina Holder provided verbal consent for this virtual visit at the beginning of the encounter.  Current Medications:  Current Outpatient Medications:    ipratropium (ATROVENT) 0.03 % nasal spray, Place 2 sprays into both nostrils every 12 (twelve) hours., Disp: 30 mL, Rfl: 0   pantoprazole (PROTONIX) 40 MG tablet, Take 1 tablet (40 mg total) by mouth daily., Disp: 30 tablet, Rfl: 1   predniSONE (DELTASONE) 20 MG tablet, Take 1 tablet (20 mg total) by mouth daily with breakfast., Disp: 7 tablet, Rfl: 0   albuterol (PROVENTIL) (2.5 MG/3ML) 0.083% nebulizer solution, Take 3 mLs (2.5 mg total) by nebulization every 6 (six) hours as needed for wheezing or shortness of breath., Disp: 75 mL, Rfl: 0   albuterol (VENTOLIN HFA) 108 (90 Base) MCG/ACT inhaler, Inhale 1-2 puffs into the lungs every 6 (six) hours as needed for wheezing or shortness of breath., Disp: 18 g, Rfl: 2   benzonatate (TESSALON) 100 MG capsule, Take 1 capsule (100 mg total) by mouth every 8 (eight) hours., Disp: 21 capsule, Rfl: 0   buPROPion (WELLBUTRIN XL) 300 MG 24 hr tablet, Take 300 mg by mouth daily., Disp: , Rfl:    escitalopram (LEXAPRO) 20 MG tablet, Take 20 mg by mouth daily. , Disp: , Rfl: 1   fexofenadine (ALLEGRA) 180 MG tablet, Take 180 mg by mouth daily., Disp: , Rfl:    fexofenadine-pseudoephedrine (ALLEGRA-D) 60-120 MG 12 hr tablet, Take 1 tablet by mouth every 12 (twelve) hours., Disp: 30 tablet, Rfl: 0   fluticasone (FLONASE) 50 MCG/ACT nasal spray, Place 2 sprays into both nostrils daily., Disp: 16 g, Rfl: 0   fluticasone-salmeterol (ADVAIR) 500-50 MCG/ACT AEPB, Inhale 1 puff into the lungs in the  morning and at bedtime., Disp: 60 each, Rfl: 11   montelukast (SINGULAIR) 10 MG tablet, Take 1 tablet (10 mg total) by mouth daily., Disp: 30 tablet, Rfl: 11   promethazine-dextromethorphan (PROMETHAZINE-DM) 6.25-15 MG/5ML syrup, Take 5 mLs by mouth 4 (four) times daily as needed for cough. (Patient not taking: Reported on 11/13/2021), Disp: 118 mL, Rfl: 0   promethazine-phenylephrine (PROMETHAZINE VC) 6.25-5 MG/5ML SYRP, Take 5 mLs by mouth every 6 (six) hours as needed for congestion. (Patient not taking: Reported on 11/13/2021), Disp: 180 mL, Rfl: 0   Tiotropium Bromide Monohydrate (SPIRIVA RESPIMAT) 1.25 MCG/ACT AERS, Take 2 puffs by mouth once daily in the morning, Disp: 4 g, Rfl: 11   Medications ordered in this encounter:  Meds ordered this encounter  Medications   ipratropium (ATROVENT) 0.03 % nasal spray    Sig: Place 2 sprays into both nostrils every 12 (twelve) hours.    Dispense:  30 mL    Refill:  0    Order Specific Question:   Supervising Provider    Answer:   Chase Picket [[id number]]   pantoprazole (PROTONIX) 40 MG tablet    Sig: Take 1 tablet (40 mg total) by mouth daily.    Dispense:  30 tablet    Refill:  1    Order Specific Question:   Supervising Provider    Answer:   Chase Picket [[id number]]   predniSONE (DELTASONE) 20 MG tablet    Sig: Take 1 tablet (  20 mg total) by mouth daily with breakfast.    Dispense:  7 tablet    Refill:  0    Order Specific Question:   Supervising Provider    Answer:   Chase Picket [[id number]]     *If you need refills on other medications prior to your next appointment, please contact your pharmacy*  Follow-Up: Call back or seek an in-person evaluation if the symptoms worsen or if the condition fails to improve as anticipated.  Other Instructions  Postnasal Drip Postnasal drip is the feeling of mucus going down the back of your throat. Mucus is a slimy substance that moistens and cleans your nose and throat, as well as the air  pockets in face bones near your forehead and cheeks (sinuses). Small amounts of mucus pass from your nose and sinuses down the back of your throat all the time. This is normal. When you produce too much mucus or the mucus gets too thick, you can feel it. Some common causes of postnasal drip include: Having more mucus because of: A cold or the flu. Allergies. Cold air. Certain medicines. Gastroesophageal reflux. Having more mucus that is thicker because of: A sinus or nasal infection. Dry air. A food allergy. Follow these instructions at home: Relieving discomfort  Gargle with a mixture of salt and water 3-4 times a day or as needed. To make salt water, completely dissolve -1 tsp (3-6 g) of salt in 1 cup (237 mL) of warm water. If the air in your home is dry, use a humidifier to add moisture to the air. Use a saline spray or a container (neti pot) to flush out the nose (nasal irrigation). These methods can help clear away mucus and keep the nasal passages moist. General instructions Take over-the-counter and prescription medicines only as told by your health care provider. Follow instructions from your health care provider about eating or drinking restrictions. You may need to avoid caffeine. Avoid things that you know you are allergic to (allergens), like dust, mold, pollen, pets, or certain foods. Drink enough fluid to keep your urine pale yellow. Keep all follow-up visits. This is important. Contact a health care provider if: You have a fever. You have a sore throat or difficulty swallowing. You have a headache. You have sinus or ear pain. You have a cough that does not go away. The mucus from your nose becomes thick and is green or yellow in color. You have cold or flu symptoms that last more than 10 days. Summary Postnasal drip is the feeling of mucus going down the back of your throat. Use nasal irrigation or a nasal spray to help clear away mucus and keep the nasal passages  moist. Avoid things that you know you are allergic to (allergens), like dust, mold, pollen, pets, or certain foods. This information is not intended to replace advice given to you by your health care provider. Make sure you discuss any questions you have with your health care provider. Document Revised: 02/16/2021 Document Reviewed: 02/16/2021 Elsevier Patient Education  [place].    If you have been instructed to have an in-person evaluation today at a local Urgent Care facility, please use the link below. It will take you to a list of all of our available [facility], including address, phone number and hours of operation. Please do not delay care.  [facility]  If you or a family member do not have a primary care provider, use the  link below to schedule a visit and establish care. When you choose a [facility] primary care physician or advanced practice provider, you gain a long-term partner in health. Find a Primary Care Provider  Learn more about [facility]'s in-office and virtual care options: [facility]

## 2021-12-11 NOTE — Telephone Encounter (Signed)
This encounter was created in error - please disregard.

## 2021-12-11 NOTE — Progress Notes (Signed)
Duplicate. Patient has VV scheduled

## 2021-12-11 NOTE — Telephone Encounter (Signed)
Patient is aware of recommendations and voiced her understanding.  She agreed with plan and stated that she would present to UC or ED. Nothing further needed.

## 2021-12-21 ENCOUNTER — Ambulatory Visit: Payer: BC Managed Care – PPO

## 2021-12-21 DIAGNOSIS — R0683 Snoring: Secondary | ICD-10-CM | POA: Diagnosis not present

## 2021-12-25 DIAGNOSIS — R0683 Snoring: Secondary | ICD-10-CM | POA: Diagnosis not present

## 2021-12-26 ENCOUNTER — Telehealth: Payer: Self-pay | Admitting: Primary Care

## 2021-12-26 DIAGNOSIS — R0683 Snoring: Secondary | ICD-10-CM

## 2021-12-26 NOTE — Telephone Encounter (Signed)
ATC patient--unable to leave vm due to mailbox not being setup.   Beth, please advise on HST results from 12/21/2021. Results can not be faxed until report has been reviewed.

## 2021-12-26 NOTE — Telephone Encounter (Signed)
Spoke to patient and relayed below results. She voiced her understanding and wishes to proceed with sleep study.  HST faxed to bariatric surgeon.   Beth, would you like to order split night?

## 2021-12-26 NOTE — Telephone Encounter (Signed)
No, PSG

## 2021-12-26 NOTE — Telephone Encounter (Signed)
Spoke to patient and relayed below message.  She would like results faxed to bariatric surgeon.

## 2021-12-26 NOTE — Telephone Encounter (Signed)
Home sleep study on 12/21/2021 showed no evidence of obstructive sleep apnea, AHI 1.8 an hour.  SPO2 low 83% (basal 94%.).  May want patient to have sleep study repeated in the lab as I still have concern she may have obstructive sleep apnea due to loud snoring, witnessed apnea, waking up gasping for air and elevated Epworth score.  See if patient is in agreement with repeating sleep study in lab as a home sleep study can be negative.  We can fax home sleep study results results to bariatric surgeon.  She would like to hold off on repeating sleep study in lab.  Recommend weight loss.  Focus on side sleeping position or elevate head of bed 30 degrees with wedge pillow.  She can also still be referred for oral appliance for snoring, please make sure she understands that medical insurance does not cover device.

## 2021-12-26 NOTE — Telephone Encounter (Signed)
Patient is returning phone call. Patient phone number is [phone].

## 2021-12-27 NOTE — Telephone Encounter (Signed)
PSG has been ordered.

## 2021-12-29 ENCOUNTER — Telehealth: Payer: Self-pay | Admitting: Primary Care

## 2021-12-29 ENCOUNTER — Other Ambulatory Visit: Payer: Self-pay | Admitting: Obstetrics and Gynecology

## 2021-12-29 DIAGNOSIS — Z1231 Encounter for screening mammogram for malignant neoplasm of breast: Secondary | ICD-10-CM

## 2021-12-29 MED ORDER — PREDNISONE 10 MG PO TABS: ORAL_TABLET | ORAL | 0 refills | Status: DC

## 2021-12-29 NOTE — Telephone Encounter (Addendum)
Called and spoke to patient.  She reports of chest congestion and occ wheezing at night x1w SOB is baseline.  Denies f/c/s or additional sx. Using spiriva 2 puffs once daily, Advair BID and albuterol once daily. No recent covid test  Beth, please advise. Dr. Mortimer Fries is unavailable. Thanks

## 2021-12-29 NOTE — Telephone Encounter (Signed)
Patient is aware of message/recommendations and voiced her understanding.  Prednisone sent to preferred pharmacy. Nothing further needed.

## 2021-12-29 NOTE — Telephone Encounter (Signed)
Have her take mucinex 600-'1200mg'$  twice a day. Please send in prednisone taper '40mg'$  x 2 days, '30mg'$  x 2 days, '20mg'$  x 2 days, '10mg'$  x 2 days

## 2022-01-03 ENCOUNTER — Ambulatory Visit
Admission: RE | Admit: 2022-01-03 | Discharge: 2022-01-03 | Disposition: A | Discharge status: Home / Self Care | Payer: Medicaid Other | Source: Ambulatory Visit | Attending: Obstetrics and Gynecology | Admitting: Obstetrics and Gynecology

## 2022-01-03 DIAGNOSIS — Z1231 Encounter for screening mammogram for malignant neoplasm of breast: Secondary | ICD-10-CM

## 2022-01-13 ENCOUNTER — Telehealth: Payer: BC Managed Care – PPO | Admitting: Family Medicine

## 2022-01-13 DIAGNOSIS — B9689 Other specified bacterial agents as the cause of diseases classified elsewhere: Secondary | ICD-10-CM

## 2022-01-13 DIAGNOSIS — B3731 Acute candidiasis of vulva and vagina: Secondary | ICD-10-CM

## 2022-01-13 DIAGNOSIS — N760 Acute vaginitis: Secondary | ICD-10-CM

## 2022-01-13 MED ORDER — METRONIDAZOLE 500 MG PO TABS: 500.0000 mg | ORAL_TABLET | Freq: Two times a day (BID) | ORAL | 0 refills | Status: AC

## 2022-01-13 MED ORDER — FLUCONAZOLE 150 MG PO TABS: 150.0000 mg | ORAL_TABLET | Freq: Once | ORAL | 0 refills | Status: AC

## 2022-01-13 NOTE — Progress Notes (Signed)
E-Visit for Vaginal Symptoms  We are sorry that you are not feeling well. Here is how we plan to help! Based on what you shared with me it looks like you: May have a vaginosis due to bacteria  Vaginosis is an inflammation of the vagina that can result in discharge, itching and pain. The cause is usually a change in the normal balance of vaginal bacteria or an infection. Vaginosis can also result from reduced estrogen levels after menopause.  The most common causes of vaginosis are:   Bacterial vaginosis which results from an overgrowth of one on several organisms that are normally present in your vagina.   Yeast infections which are caused by a naturally occurring fungus called candida.   Vaginal atrophy (atrophic vaginosis) which results from the thinning of the vagina from reduced estrogen levels after menopause.   Trichomoniasis which is caused by a parasite and is commonly transmitted by sexual intercourse.  Factors that increase your risk of developing vaginosis include: Medications, such as antibiotics and steroids Uncontrolled diabetes Use of hygiene products such as bubble bath, vaginal spray or vaginal deodorant Douching Wearing damp or tight-fitting clothing Using an intrauterine device (IUD) for birth control Hormonal changes, such as those associated with pregnancy, birth control pills or menopause Sexual activity Having a sexually transmitted infection  Your treatment plan is Metronidazole or Flagyl '500mg'$  twice a day for 7 days.  I have electronically sent this prescription into the pharmacy that you have chosen. I will also send one diflucan.   Be sure to take all of the medication as directed. Stop taking any medication if you develop a rash, tongue swelling or shortness of breath. Mothers who are breast feeding should consider pumping and discarding their breast milk while on these antibiotics. However, there is no consensus that infant exposure at these doses would be  harmful.  Remember that medication creams can weaken latex condoms. Marland Kitchen   HOME CARE:  Good hygiene may prevent some types of vaginosis from recurring and may relieve some symptoms:  Avoid baths, hot tubs and whirlpool spas. Rinse soap from your outer genital area after a shower, and dry the area well to prevent irritation. Don't use scented or harsh soaps, such as those with deodorant or antibacterial action. Avoid irritants. These include scented tampons and pads. Wipe from front to back after using the toilet. Doing so avoids spreading fecal bacteria to your vagina.  Other things that may help prevent vaginosis include:  Don't douche. Your vagina doesn't require cleansing other than normal bathing. Repetitive douching disrupts the normal organisms that reside in the vagina and can actually increase your risk of vaginal infection. Douching won't clear up a vaginal infection. Use a latex condom. Both female and female latex condoms may help you avoid infections spread by sexual contact. Wear cotton underwear. Also wear pantyhose with a cotton crotch. If you feel comfortable without it, skip wearing underwear to bed. Yeast thrives in [employer] Your symptoms should improve in the next day or two.  GET HELP RIGHT AWAY IF:  You have pain in your lower abdomen ( pelvic area or over your ovaries) You develop nausea or vomiting You develop a fever Your discharge changes or worsens You have persistent pain with intercourse You develop shortness of breath, a rapid pulse, or you faint.  These symptoms could be signs of problems or infections that need to be evaluated by a medical provider now.  MAKE SURE YOU   Understand these instructions. Will  watch your condition. Will get help right away if you are not doing well or get worse.  Thank you for choosing an e-visit.  Your e-visit answers were reviewed by a board certified advanced clinical practitioner to complete your personal care  plan. Depending upon the condition, your plan could have included both over the counter or prescription medications.  Please review your pharmacy choice. Make sure the pharmacy is open so you can pick up prescription now. If there is a problem, you may contact your provider through [employer] and have the prescription routed to another pharmacy.  Your safety is important to [place]. If you have drug allergies check your prescription carefully.   For the next 24 hours you can use MyChart to ask questions about today's visit, request a non-urgent call back, or ask for a work or school excuse. You will get an email in the next two days asking about your experience. I hope that your e-visit has been valuable and will speed your recovery.  I have provided 5 minutes of non face to face time during this encounter for chart review and documentation.

## 2022-01-16 ENCOUNTER — Ambulatory Visit (INDEPENDENT_AMBULATORY_CARE_PROVIDER_SITE_OTHER): Payer: BC Managed Care – PPO

## 2022-01-16 ENCOUNTER — Ambulatory Visit
Admission: EM | Admit: 2022-01-16 | Discharge: 2022-01-16 | Disposition: A | Discharge status: Home / Self Care | Payer: BC Managed Care – PPO | Attending: Emergency Medicine | Admitting: Emergency Medicine

## 2022-01-16 ENCOUNTER — Telehealth: Payer: Self-pay | Admitting: Primary Care

## 2022-01-16 DIAGNOSIS — R0602 Shortness of breath: Secondary | ICD-10-CM | POA: Diagnosis not present

## 2022-01-16 DIAGNOSIS — J069 Acute upper respiratory infection, unspecified: Secondary | ICD-10-CM

## 2022-01-16 DIAGNOSIS — J4541 Moderate persistent asthma with (acute) exacerbation: Secondary | ICD-10-CM | POA: Insufficient documentation

## 2022-01-16 DIAGNOSIS — J029 Acute pharyngitis, unspecified: Secondary | ICD-10-CM | POA: Diagnosis not present

## 2022-01-16 DIAGNOSIS — R059 Cough, unspecified: Secondary | ICD-10-CM

## 2022-01-16 DIAGNOSIS — Z1152 Encounter for screening for COVID-19: Secondary | ICD-10-CM | POA: Insufficient documentation

## 2022-01-16 DIAGNOSIS — R110 Nausea: Secondary | ICD-10-CM | POA: Insufficient documentation

## 2022-01-16 DIAGNOSIS — R058 Other specified cough: Secondary | ICD-10-CM | POA: Insufficient documentation

## 2022-01-16 DIAGNOSIS — R062 Wheezing: Secondary | ICD-10-CM

## 2022-01-16 LAB — SARS CORONAVIRUS 2 BY RT PCR: SARS Coronavirus 2 by RT PCR: NEGATIVE

## 2022-01-16 MED ORDER — BENZONATATE 100 MG PO CAPS: 200.0000 mg | ORAL_CAPSULE | Freq: Three times a day (TID) | ORAL | 0 refills | Status: DC

## 2022-01-16 MED ORDER — PREDNISONE 20 MG PO TABS: 60.0000 mg | ORAL_TABLET | Freq: Every day | ORAL | 0 refills | Status: AC

## 2022-01-16 MED ORDER — DEXAMETHASONE SODIUM PHOSPHATE 10 MG/ML IJ SOLN
10.0000 mg | Freq: Once | INTRAMUSCULAR | Status: AC
  Administered 2022-01-16: 10 mg via INTRAMUSCULAR

## 2022-01-16 MED ORDER — PROMETHAZINE-DM 6.25-15 MG/5ML PO SYRP: 5.0000 mL | ORAL_SOLUTION | Freq: Four times a day (QID) | ORAL | 0 refills | Status: DC | PRN

## 2022-01-16 NOTE — Telephone Encounter (Signed)
Spoke to patient.  She stated that she completed course of prednisone that was prescribed 12/29/2021 with some improvement in sx. Sx did not completely subside. She reports of increased SOB, nasal/chest congestion, prod cough with thick white mucus, fatigued, wheezing and headache x1w Denied f/c/s or additional sx. No recent covid test. Recommended home covid test. She is using mucinex once daily. She is unsure of the dosage.  She will test for covid and call back with results.

## 2022-01-16 NOTE — Telephone Encounter (Addendum)
Spoke to patient and scheduled appt 03/21/2022 at 2:00, as this is first available in [facility].  She voiced her understanding.  Nothing further needed.

## 2022-01-16 NOTE — ED Provider Notes (Addendum)
[facility]    CSN: [id number] Arrival date & time: 01/16/22  1133      History   Chief Complaint Chief Complaint  Patient presents with   Cough    HPI Sabrina Holder is a 41 y.o. female.   HPI  41 year old female here for evaluation of respiratory complaints.  Patient reports that she has been experiencing a cough that is intermittently productive for thick white sputum, shortness breath, and wheezing for last 4 days.  She also endorses runny nose, nasal congestion, sore throat, headache, and body aches.  She is also been extremely nauseous.  She denies any ear pain, vomiting, or diarrhea.  Past Medical History:  Diagnosis Date   Anemia    Anxiety    Asthma    Depression    Environmental allergies    GERD (gastroesophageal reflux disease)    Renal disorder     Patient Active Problem List   Diagnosis Date Noted   Loud snoring 11/13/2021   GERD (gastroesophageal reflux disease) 11/13/2021   Menorrhagia with regular cycle 12/15/2019   Anemia due to chronic blood loss 12/15/2019   Class 2 obesity due to excess calories with body mass index (BMI) of 39.0 to 39.9 in adult 11/05/2017   Prediabetes 11/05/2017   Moderate asthma with acute exacerbation 03/29/2017    Past Surgical History:  Procedure Laterality Date   ABDOMINAL HYSTERECTOMY     CESAREAN SECTION     X 3   COLONOSCOPY N/A 12/26/2020   Procedure: COLONOSCOPY;  Surgeon: Annamaria Helling, DO;  Location: [place];  Service: Gastroenterology;  Laterality: N/A;   CYSTOSCOPY N/A 12/15/2019   Procedure: CYSTOSCOPY;  Surgeon: Will Bonnet, MD;  Location: [facility];  Service: Gynecology;  Laterality: N/A;   ESOPHAGOGASTRODUODENOSCOPY N/A 12/26/2020   Procedure: ESOPHAGOGASTRODUODENOSCOPY (EGD);  Surgeon: Annamaria Helling, DO;  Location: [facility];  Service: Gastroenterology;  Laterality: N/A;   TOTAL LAPAROSCOPIC HYSTERECTOMY WITH SALPINGECTOMY Bilateral 12/15/2019    Procedure: TOTAL LAPAROSCOPIC HYSTERECTOMY WITH SALPINGECTOMY;  Surgeon: Will Bonnet, MD;  Location: [facility];  Service: Gynecology;  Laterality: Bilateral;   TUBAL LIGATION      OB History     Gravida  6   Para  5   Term  5   Preterm      AB  1   Living  5      SAB      IAB  1   Ectopic      Multiple      Live Births  5            Home Medications    Prior to Admission medications   Medication Sig Start Date End Date Taking? Authorizing Provider  benzonatate (TESSALON) 100 MG capsule Take 2 capsules (200 mg total) by mouth every 8 (eight) hours. 01/16/22  Yes Margarette Canada, NP  metFORMIN (GLUCOPHAGE) 500 MG tablet Take 1,000 mg by mouth daily with breakfast.   Yes [provider]  predniSONE (DELTASONE) 20 MG tablet Take 3 tablets (60 mg total) by mouth daily with breakfast for 5 days. 3 tablets daily for 5 days. 01/16/22 01/21/22 Yes Margarette Canada, NP  promethazine-dextromethorphan (PROMETHAZINE-DM) 6.25-15 MG/5ML syrup Take 5 mLs by mouth 4 (four) times daily as needed. 01/16/22  Yes Margarette Canada, NP  albuterol (PROVENTIL) (2.5 MG/3ML) 0.083% nebulizer solution Take 3 mLs (2.5 mg total) by nebulization every 6 (six) hours as needed for wheezing or shortness of breath. 05/03/21  Menshew, Dannielle Karvonen, PA-C  albuterol (VENTOLIN HFA) 108 (90 Base) MCG/ACT inhaler Inhale 1-2 puffs into the lungs every 6 (six) hours as needed for wheezing or shortness of breath. 11/13/21   Martyn Ehrich, NP  buPROPion (WELLBUTRIN XL) 300 MG 24 hr tablet Take 300 mg by mouth daily. 08/11/19   [provider]  escitalopram (LEXAPRO) 20 MG tablet Take 20 mg by mouth daily.  09/03/17   [provider]  fexofenadine (ALLEGRA) 180 MG tablet Take 180 mg by mouth daily.    [provider]  fexofenadine-pseudoephedrine (ALLEGRA-D) 60-120 MG 12 hr tablet Take 1 tablet by mouth every 12 (twelve) hours. 07/12/20   Caccavale, Sophia, PA-C  fluticasone  (FLONASE) 50 MCG/ACT nasal spray Place 2 sprays into both nostrils daily. 09/09/21   Hassell Done, Mary-Margaret, FNP  fluticasone-salmeterol (ADVAIR) 500-50 MCG/ACT AEPB Inhale 1 puff into the lungs in the morning and at bedtime. 09/29/21   Martyn Ehrich, NP  ipratropium (ATROVENT) 0.03 % nasal spray Place 2 sprays into both nostrils every 12 (twelve) hours. 12/11/21   Mar Daring, PA-C  metroNIDAZOLE (FLAGYL) 500 MG tablet Take 1 tablet (500 mg total) by mouth 2 (two) times daily for 7 days. 01/13/22 01/20/22  Tempie Hoist, FNP  montelukast (SINGULAIR) 10 MG tablet Take 1 tablet (10 mg total) by mouth daily. 09/29/21   Martyn Ehrich, NP  pantoprazole (PROTONIX) 40 MG tablet Take 1 tablet (40 mg total) by mouth daily. 12/11/21   Mar Daring, PA-C  promethazine-phenylephrine (PROMETHAZINE VC) 6.25-5 MG/5ML SYRP Take 5 mLs by mouth every 6 (six) hours as needed for congestion. Patient not taking: Reported on 11/13/2021 03/27/21   Margarette Canada, NP  Tiotropium Bromide Monohydrate (SPIRIVA RESPIMAT) 1.25 MCG/ACT AERS Take 2 puffs by mouth once daily in the morning 09/29/21   Martyn Ehrich, NP  ferrous sulfate 325 (65 FE) MG tablet Take 1 tablet (325 mg total) by mouth daily. 10/16/19 04/02/20  Melynda Ripple, MD  sertraline (ZOLOFT) 50 MG tablet Take 1 tablet (50 mg total) by mouth daily. 01/11/16 12/08/18  Norval Gable, MD    Family History Family History  Problem Relation Age of Onset   Asthma Mother    Gout Father    Cancer Maternal Grandmother 32       unknown type   Uterine cancer Neg Hx    Ovarian cancer Neg Hx    Breast cancer Neg Hx     Social History Social History   Tobacco Use   Smoking status: Former    Packs/day: 0.50    Years: 20.00    Total pack years: 10.00    Types: Cigarettes    Quit date: 11/01/2021    Years since quitting: 0.2   Smokeless tobacco: Never   Tobacco comments:    Quit in 2022 and recently started back.  Stopped again a week and a  half ago on wellbutrin.  11/13/2021 hfb  Vaping Use   Vaping Use: Never used  Substance Use Topics   Alcohol use: Yes    Comment: occasional (once weekly)   Drug use: No     Allergies   Patient has no known allergies.   Review of Systems Review of Systems  Constitutional:  Negative for fever.  HENT:  Positive for congestion, rhinorrhea and sore throat. Negative for ear pain.   Respiratory:  Positive for cough, shortness of breath and wheezing.   Gastrointestinal:  Negative for diarrhea, nausea and vomiting.  Musculoskeletal:  Positive for arthralgias and myalgias.  Skin:  Negative for rash.  Neurological:  Positive for headaches.  Hematological: Negative.   Psychiatric/Behavioral: Negative.       Physical Exam Triage Vital Signs ED Triage Vitals  Enc Vitals Group     BP 01/16/22 1200 (!) 145/107     Pulse Rate 01/16/22 1200 98     Resp 01/16/22 1200 (!) 22     Temp 01/16/22 1200 99 F (37.2 C)     Temp Source 01/16/22 1200 Oral     SpO2 01/16/22 1200 97 %     Weight --      Height --      Head Circumference --      Peak Flow --      Pain Score 01/16/22 1155 0     Pain Loc --      Pain Edu? --      Excl. in [place]? --    No data found.  Updated Vital Signs BP (!) 145/107 (BP Location: Left Arm)   Pulse 98   Temp 99 F (37.2 C) (Oral)   Resp (!) 22   LMP 10/20/2019 (Approximate)   SpO2 97%   Visual Acuity Right Eye Distance:   Left Eye Distance:   Bilateral Distance:    Right Eye Near:   Left Eye Near:    Bilateral Near:     Physical Exam Vitals and nursing note reviewed.  Constitutional:      Appearance: Normal appearance. She is not ill-appearing.  HENT:     Head: Normocephalic and atraumatic.     Right Ear: Tympanic membrane, ear canal and external ear normal. There is no impacted cerumen.     Left Ear: Tympanic membrane, ear canal and external ear normal. There is no impacted cerumen.     Nose: Congestion and rhinorrhea present.     Comments:  Nasal mucosa is erythematous and edematous with scant clear rhinorrhea in both nares.    Mouth/Throat:     Mouth: Mucous membranes are moist.     Pharynx: Oropharynx is clear. Posterior oropharyngeal erythema present. No oropharyngeal exudate.     Comments: Posterior pharynx has erythema and injection with clear postnasal drip. Cardiovascular:     Rate and Rhythm: Normal rate and regular rhythm.     Pulses: Normal pulses.     Heart sounds: Normal heart sounds. No murmur heard.    No friction rub. No gallop.  Pulmonary:     Effort: Pulmonary effort is normal.     Breath sounds: Wheezing present. No rales.  Musculoskeletal:     Cervical back: Normal range of motion and neck supple.  Lymphadenopathy:     Cervical: No cervical adenopathy.  Skin:    General: Skin is warm and dry.     Capillary Refill: Capillary refill takes less than 2 seconds.     Findings: No erythema or rash.  Neurological:     General: No focal deficit present.     Mental Status: She is alert and oriented to person, place, and time.  Psychiatric:        Mood and Affect: Mood normal.        Behavior: Behavior normal.        Thought Content: Thought content normal.        Judgment: Judgment normal.      UC Treatments / Results  Labs (all labs ordered are listed, but only abnormal results are displayed) Labs Reviewed  SARS  CORONAVIRUS 2 BY RT PCR    EKG   Radiology DG Chest 2 View  Result Date: 01/16/2022 CLINICAL DATA:  Productive cough, short of breath, and wheezing x4 days. History of asthma. EXAM: CHEST - 2 VIEW COMPARISON:  Chest x-ray October 16, 2021. FINDINGS: The heart size and mediastinal contours are within normal limits. Both lungs are clear. No visible pleural effusions or pneumothorax. No acute osseous abnormality. IMPRESSION: No active cardiopulmonary disease. Electronically Signed   By: Margaretha Sheffield M.D.   On: 01/16/2022 12:58    Procedures Procedures (including critical care  time)  Medications Ordered in UC Medications - No data to display  Initial Impression / Assessment and Plan / UC Course  I have reviewed the triage vital signs and the nursing notes.  Pertinent labs & imaging results that were available during my care of the patient were reviewed by me and considered in my medical decision making (see chart for details).   Patient is a pleasant 41 year old female here for evaluation of respiratory complaints that she states initially felt like a traditional asthma flare but then when she developed a runny nose, nasal congestion, headache, body aches that she started to be more concerned that she developing infectious process.  She has been using her albuterol inhaler for shortness breath and wheezing without significant improvement of her symptoms.  She is able to speak in full sentences and does not demonstrate any dyspnea or tachypnea.  She does have signs of upper respiratory infection on her physical exam and she has diffuse wheezing throughout all of her lung fields.  I will order a COVID PCR and also chest x-ray to look for any acute cardiopulmonary process.  COVID PCR is negative.  Radiology impression of chest x-ray states no active cardiopulmonary disease.  I will discharge patient home with a diagnosis of asthma exacerbation and start her on 60 mg of prednisone daily to help decrease pulmonary inflammation and have her use her albuterol inhaler on a schedule of 2 puffs every 4-6 hours for the next 1 to 2 days to help with your breathing symptoms.  I will also give her a prescription for Tessalon Perles and Promethazine DM cough syrup.  Patient requesting Decadron injection for pulmonary inflammation.   Final Clinical Impressions(s) / UC Diagnoses   Final diagnoses:  Viral upper respiratory tract infection  Moderate persistent asthma with exacerbation     Discharge Instructions      Your COVID test was negative and your chest x-ray did not  demonstrate any evidence of pneumonia.  I believe your symptoms are a result of an asthma exacerbation brought on by an upper respiratory infection.  Use your albuterol inhaler on a schedule of 2 puffs every 4-6 hours for the next 1 to 2 days to help with your wheezing and shortness of breath.  Starting tomorrow morning take the prednisone 60 mg each morning at breakfast.  You will take this each morning for 5 days to decrease pulmonary inflammation.  Use the Tessalon Perles every 8 hours during the day as needed to help with your cough.  Take them with a small sip of water.  You may experience numbness to the base of your tongue or metallic taste in your mouth, this is normal.  Use the Promethazine DM cough syrup as needed at bedtime for cough and congestion.  Tylenol and ibuprofen as needed for fever or body aches.  Return for reevaluation, or see your primary care provider, for any continued  or worsening symptoms.     ED Prescriptions     Medication Sig Dispense Auth. Provider   benzonatate (TESSALON) 100 MG capsule Take 2 capsules (200 mg total) by mouth every 8 (eight) hours. 21 capsule Margarette Canada, NP   predniSONE (DELTASONE) 20 MG tablet Take 3 tablets (60 mg total) by mouth daily with breakfast for 5 days. 3 tablets daily for 5 days. 15 tablet Margarette Canada, NP   promethazine-dextromethorphan (PROMETHAZINE-DM) 6.25-15 MG/5ML syrup Take 5 mLs by mouth 4 (four) times daily as needed. 118 mL Margarette Canada, NP      PDMP not reviewed this encounter.   Margarette Canada, NP 01/16/22 1318    Margarette Canada, NP 01/16/22 1319    Margarette Canada, NP 01/16/22 1322

## 2022-01-16 NOTE — Telephone Encounter (Signed)
ATC patient--unable to leave vm due to mailbox not being setup.  °Will call back.  ° °

## 2022-01-16 NOTE — Discharge Instructions (Addendum)
Your COVID test was negative and your chest x-ray did not demonstrate any evidence of pneumonia.  I believe your symptoms are a result of an asthma exacerbation brought on by an upper respiratory infection.  Use your albuterol inhaler on a schedule of 2 puffs every 4-6 hours for the next 1 to 2 days to help with your wheezing and shortness of breath.  Starting tomorrow morning take the prednisone 60 mg each morning at breakfast.  You will take this each morning for 5 days to decrease pulmonary inflammation.  Use the Tessalon Perles every 8 hours during the day as needed to help with your cough.  Take them with a small sip of water.  You may experience numbness to the base of your tongue or metallic taste in your mouth, this is normal.  Use the Promethazine DM cough syrup as needed at bedtime for cough and congestion.  Tylenol and ibuprofen as needed for fever or body aches.  Return for reevaluation, or see your primary care provider, for any continued or worsening symptoms.

## 2022-01-16 NOTE — Telephone Encounter (Signed)
Def a consideration. Needs visit to discuss in detail

## 2022-01-16 NOTE — Telephone Encounter (Signed)
Called and spoke to patient.  She stated that she was seen at [facility] today and was prescribed prednisone, Tessalon and promethazine. Neg for covid. CXR performed.  She would like to know if she should start allergy injection for the persistent sx. She does not currently see an allergist.   Sabrina Holder, please advise. Thanks

## 2022-01-16 NOTE — ED Triage Notes (Signed)
Pt presents with cough, body aches, HA and SOB with exertion x 4 days. Cough productive especially at night. No known exposure. No home testing.

## 2022-02-28 ENCOUNTER — Ambulatory Visit: Payer: Medicaid Other

## 2022-03-21 ENCOUNTER — Encounter: Payer: Self-pay | Admitting: Primary Care

## 2022-03-21 ENCOUNTER — Telehealth: Payer: Self-pay | Admitting: Primary Care

## 2022-03-21 ENCOUNTER — Ambulatory Visit (INDEPENDENT_AMBULATORY_CARE_PROVIDER_SITE_OTHER): Payer: Medicaid Other | Admitting: Primary Care

## 2022-03-21 VITALS — BP 140/80 | HR 100 | Temp 97.9°F | Ht 66.0 in | Wt 274.2 lb

## 2022-03-21 DIAGNOSIS — K219 Gastro-esophageal reflux disease without esophagitis: Secondary | ICD-10-CM | POA: Diagnosis not present

## 2022-03-21 DIAGNOSIS — R0683 Snoring: Secondary | ICD-10-CM | POA: Diagnosis not present

## 2022-03-21 DIAGNOSIS — J4541 Moderate persistent asthma with (acute) exacerbation: Secondary | ICD-10-CM

## 2022-03-21 DIAGNOSIS — J4540 Moderate persistent asthma, uncomplicated: Secondary | ICD-10-CM | POA: Diagnosis not present

## 2022-03-21 LAB — NITRIC OXIDE: Nitric Oxide: 42

## 2022-03-21 MED ORDER — PREDNISONE 10 MG PO TABS
ORAL_TABLET | ORAL | 0 refills | Status: DC
Start: 2022-03-21 — End: 2022-04-18

## 2022-03-21 MED ORDER — FAMOTIDINE 20 MG PO TABS: 20.0000 mg | ORAL_TABLET | Freq: Every day | ORAL | 5 refills | Status: DC

## 2022-03-21 NOTE — Assessment & Plan Note (Signed)
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 She will need a visit with our pharmacy team in [place] - Sending RX prednisone taper '40mg'$  x 2 days, '30mg'$  x 2 days, '20mg'$  x 2 days; '10mg'$  x 2 days  - FU 4-6 weeks

## 2022-03-21 NOTE — Patient Instructions (Addendum)
Recommending we starting you on biologic for eosinophilic asthma with frequent exacerbations requiring prednisone  You will need to meet with pharmacist in [place] for first initial visit   Home sleep study did not show evidence of sleep apnea, no intervention required   Recommendations: Continue Advair 500-7mg 1 puff twice daily Continue Spiriva Respimat 2 puffs daily in the morning Continue Singulair 10 mg at bedtime Continue reflux medication as directed daily  Start famotidine (pepcid) '20mg'$  at bedtime   Orders: FENO  RX: Prednisone taper   Follow-up: 4-6 weeks with BEustaquio MaizeNP or Dr. KMortimer Friesin BCollbranis this medication? BENRALIZUMAB (BEN ra LIZ oo mab) prevents the symptoms of asthma. It is prescribed when other asthma medications have not worked well enough. It works by decreasing inflammation of the airways, making it easier to breathe. Do not use it to treat a sudden asthma attack. It is a monoclonal antibody. This medicine may be used for other purposes; ask your health care provider or pharmacist if you have questions. COMMON BRAND NAME(S): FBerna BueWhat should I tell my care team before I take this medication? They need to know if you have any of these conditions: Parasitic (helminth) infection An unusual or allergic reaction to benralizumab, hamster proteins, other medications, foods, dyes, or preservatives Pregnant or trying to get pregnant Breast-feeding How should I use this medication? This medication is injected under the skin. You will be taught how to prepare and give it. Take it as directed on the prescription label. Keep taking it unless your care team tells you to stop. If you use a pen, be sure to take off the outer needle cover before using the dose. It is important that you put your used needles and syringes in a special sharps container. Do not put them in a trash can. If you do not have a sharps container, call your  pharmacist or care team to get one. A patient package insert for the product will be given with each prescription and refill. Be sure to read this information carefully each time. The sheet may change often. This medication comes with INSTRUCTIONS FOR USE. Ask your pharmacist for directions on how to use this medication. Read the information carefully. Talk to your pharmacist or care team if you have questions. Talk to your care team about the use of this medication in children. While this medication may be prescribed for children as young as 12 years for selected conditions, precautions do apply. Overdosage: If you think you have taken too much of this medicine contact a poison control center or emergency room at once. NOTE: This medicine is only for you. Do not share this medicine with others. What if I miss a dose? It is important not to miss your dose. Call your care team if you are unable to keep an appointment. If you give yourself this medication at home, and you miss a dose, take it as soon as you can. If it is almost time for your next dose, take only that dose. Do not take double or extra doses. Call your care team with questions. What may interact with this medication? Interactions are not expected. This list may not describe all possible interactions. Give your health care provider a list of all the medicines, herbs, non-prescription drugs, or dietary supplements you use. Also tell them if you smoke, drink alcohol, or use illegal drugs. Some items may interact with your medicine. What should I watch for while using this  medication? Visit your care team for regular checks on your progress. NEVER use this medication for an acute asthma attack. Tell your care team if your symptoms do not start to get better or if they get worse. Do not stop taking your other asthma medications unless instructed to do so by your care team. What side effects may I notice from receiving this medication? Side  effects that you should report to your care team as soon as possible: Allergic reactions or angioedema--skin rash, itching or hives, swelling of the face, eyes, lips, tongue, arms, or legs, trouble swallowing or breathing Side effects that usually do not require medical attention (report these to your care team if they continue or are bothersome): Headache Sore throat This list may not describe all possible side effects. Call your doctor for medical advice about side effects. You may report side effects to FDA at 1-800-FDA-1088. Where should I keep my medication? Keep out of the reach of children and pets. Store in a refrigerator or at room temperature between 20 and 25 degrees C (68 and 77 degrees F). Refrigeration (preferred): Store in the refrigerator. Protect from light. Keep this drug in the original container until you are ready to take it. Throw away any unused drug after the expiration date. Room Temperature: This drug may be stored at room temperature for up to 14 days. Keep this drug in the original container until you are ready to take it. If it is stored at room temperature, throw away any unused drug after 14 days or after it expires, whichever is first. To get rid of medications that are no longer needed or have expired: Take the medication to a medication take-back program. Check with your pharmacy or law enforcement to find a location. If you cannot return the medication, check with the label to see if the medication should be thrown in the garbage. If you are not sure, ask your care team. If it is safe to put in the trash, empty the medication out of the container. Mix the medication with cat litter, dirt, coffee grounds, or other unwanted substance. Seal the mixture in a bag or container. Put in the trash. NOTE: This sheet is a summary. It may not cover all possible information. If you have questions about this medicine, talk to your doctor, pharmacist, or health care provider.   2023 Elsevier/Gold Standard (2021-01-25 00:00:00)

## 2022-03-21 NOTE — Assessment & Plan Note (Signed)
-   She is having breakthrough reflux symptoms. Maintained on PPI, would benefit from addition of H2 blocker at bedtime.

## 2022-03-21 NOTE — Assessment & Plan Note (Signed)
-   Sleep study in September 2023 was negative for OSA, AHI 1.8/hour with SpO2 low 83% (baseline 94%)

## 2022-03-21 NOTE — Progress Notes (Signed)
$'@Patient'T$  ID: Sabrina Holder, female    DOB: March 26, 1981, 41 y.o.   MRN: [id number]  Chief Complaint  Patient presents with   Follow-up    HST 12/21/2021-c/o wheezing.     Referring provider: [facility]  HPI: 41 year old female, current smoker. PMH significant for moderate persistent asthma. Patient of Dr. Mortimer Fries.   Previous [facility] pulmonary encounter:  03/08/2020  Patient contacted today for acute televisit. States that her breathing has been worse since Thursday 03/03/20. She has an associated dry cough. Cold weather exacerbates her asthma symptoms. She is compliant with Advair 500, Spiriva 1.90mg and Singulair. She has been using albuterol rescue inhaler 10 times a day with temporary improvement.  She does improve with oral prednisone, she last received prednsione '20mg'$  x 5 days from her PCP in November 2021. She works at the [employer]    09/29/2021 Patient contacted today for acute overview.  She reports symptoms of shortness of breath at night.  She has woken her self up at night gasping for air. She believes she has sleep apnea.  She has never had a sleep study. She is concerned about cost d/t having medicaid.  She also reports symptoms of increased cough and wheezing last several weeks.  She is currently using Advair 500-531m 1 puff twice daily.  She is not currently taking Spiriva or Singulair.  She has associated allergy symptoms including nasal congestion/postnasal drip.   Asthma - Mild exacerbation.  Sending in prednisone taper.  Advised patient resume Singulair 10 mg at bedtime and Spiriva Respimat 1.25 mcg 2 puffs once daily.  Continue Advair 500-501m1 puff twice daily.  Recommend checking CBC with differential and respiratory allergy panel at follow-up.    8/14/202 Patient presents today for asthma follow-up/sleep consult. She was seen for virtual visit back in June for mild asthma exacerbation. She is maintained on Advair 500-49m21mne puff  twice daily. We recently added Spiriva respimat 1.25mc34mo puffs daily to her regimen and advised that she resume Singulair.  She saw some improvement in her breathing with recent prednisone taper but continues to have upper airway wheezing.  She is not currently taking omeprazole as she thought this is what caused her to have a GI bleed.  He is compliant with both Advair and Spiriva. She has resumed Singulair as instructed. Chest x-ray in mid July that showed unchanged opacities overlying bilateral lower lungs, favored to represent overlying breast tissue.  No discrete focal airspace opacities.  Evidence of edema.   She has symptoms of loud snoring and witnessed apnea. She has woken herself up on numerous occasional snoring/gasping for air. She has associated daytime sleepiness. She has had no formal sleep studies, she has been ordered for study in the past but could not afford her copay. Typical bedtime is between 10-11pm. It can take her several hours to fall asleep. She wakes up 4-5 times a night. She starts her day at 6:30am. Denies narcolepsy, cataplexy or sleep walking.   Sleep questionnaire Symptoms- Loud snoring, waking up gasping for air, witnessed apnea Prior sleep study- None  Bedtime- 10-11pm Time to fall asleep- 3 hours  Nocturnal awakenings- 4-5 times Out of bed/start of day- 6:30am  Weight changes- 50 lbs up Do you operate heavy machinery- No Do you currently wear CPAP- No Do you current wear oxygen- No Epworth- 13 Medications- Wellbutrin XL, Lexapro '20mg'$  daily   03/21/2022 Patient presents today for follow-up asthma She is doing better  but still having wheezing symptoms at night/early morning She has been on prednisone several times this year  She is compliant with Advair, Singulair. Using albuterol 2-4 times per week.  Sleep study in September was negative for OSA, AHI 1.8/hour with SpO2 low 83% (baseline 94%) Eos were 400 in August, IgE 13 FENO today was 42 ACT score 14     No Known Allergies  Immunization History  Administered Date(s) Administered   Influenza-Unspecified 01/01/2022    Past Medical History:  Diagnosis Date   Anemia    Anxiety    Asthma    Depression    Environmental allergies    GERD (gastroesophageal reflux disease)    Renal disorder     Tobacco History: Social History   Tobacco Use  Smoking Status Former   Packs/day: 1.00   Years: 20.00   Total pack years: 20.00   Types: Cigarettes   Quit date: 11/01/2021   Years since quitting: 0.3  Smokeless Tobacco Never  Tobacco Comments   Quit in 2022 and recently started back.  Stopped again a week and a half ago on wellbutrin.  11/13/2021 hfb   Counseling given: Not Answered Tobacco comments: Quit in 2022 and recently started back.  Stopped again a week and a half ago on wellbutrin.  11/13/2021 hfb   Outpatient Medications Prior to Visit  Medication Sig Dispense Refill   albuterol (PROVENTIL) (2.5 MG/3ML) 0.083% nebulizer solution Take 3 mLs (2.5 mg total) by nebulization every 6 (six) hours as needed for wheezing or shortness of breath. 75 mL 0   albuterol (VENTOLIN HFA) 108 (90 Base) MCG/ACT inhaler Inhale 1-2 puffs into the lungs every 6 (six) hours as needed for wheezing or shortness of breath. 18 g 2   benzonatate (TESSALON) 100 MG capsule Take 2 capsules (200 mg total) by mouth every 8 (eight) hours. 21 capsule 0   buPROPion (WELLBUTRIN XL) 300 MG 24 hr tablet Take 300 mg by mouth daily.     escitalopram (LEXAPRO) 20 MG tablet Take 20 mg by mouth daily.   1   fexofenadine (ALLEGRA) 180 MG tablet Take 180 mg by mouth daily.     fexofenadine-pseudoephedrine (ALLEGRA-D) 60-120 MG 12 hr tablet Take 1 tablet by mouth every 12 (twelve) hours. 30 tablet 0   fluticasone (FLONASE) 50 MCG/ACT nasal spray Place 2 sprays into both nostrils daily. 16 g 0   fluticasone-salmeterol (ADVAIR) 500-50 MCG/ACT AEPB Inhale 1 puff into the lungs in the morning and at bedtime. 60 each 11    ipratropium (ATROVENT) 0.03 % nasal spray Place 2 sprays into both nostrils every 12 (twelve) hours. 30 mL 0   metFORMIN (GLUCOPHAGE) 500 MG tablet Take 1,000 mg by mouth daily with breakfast.     montelukast (SINGULAIR) 10 MG tablet Take 1 tablet (10 mg total) by mouth daily. 30 tablet 11   pantoprazole (PROTONIX) 40 MG tablet Take 1 tablet (40 mg total) by mouth daily. 30 tablet 1   promethazine-dextromethorphan (PROMETHAZINE-DM) 6.25-15 MG/5ML syrup Take 5 mLs by mouth 4 (four) times daily as needed. 118 mL 0   promethazine-phenylephrine (PROMETHAZINE VC) 6.25-5 MG/5ML SYRP Take 5 mLs by mouth every 6 (six) hours as needed for congestion. 180 mL 0   Tiotropium Bromide Monohydrate (SPIRIVA RESPIMAT) 1.25 MCG/ACT AERS Take 2 puffs by mouth once daily in the morning 4 g 11   No facility-administered medications prior to visit.   Review of Systems  Review of Systems  Constitutional: Negative.   HENT: Negative.  Respiratory:  Positive for wheezing.   Cardiovascular: Negative.     Physical Exam  BP (!) 140/80 (BP Location: Left Arm, Cuff Size: Large)   Pulse 100   Temp 97.9 F (36.6 C) (Temporal)   Ht '5\' 6"'$  (1.676 m)   Wt 274 lb 3.2 oz (124.4 kg)   LMP 10/20/2019 (Approximate)   SpO2 95%   BMI 44.26 kg/m  Physical Exam Constitutional:      Appearance: Normal appearance.  HENT:     Head: Normocephalic and atraumatic.     Mouth/Throat:     Mouth: Mucous membranes are moist.     Pharynx: Oropharynx is clear.  Cardiovascular:     Rate and Rhythm: Normal rate and regular rhythm.  Pulmonary:     Effort: Pulmonary effort is normal.     Breath sounds: Wheezing present. No rhonchi or rales.  Musculoskeletal:        General: Normal range of motion.  Skin:    General: Skin is warm and dry.  Neurological:     General: No focal deficit present.     Mental Status: She is alert and oriented to person, place, and time. Mental status is at baseline.  Psychiatric:        Mood and  Affect: Mood normal.        Behavior: Behavior normal.        Thought Content: Thought content normal.        Judgment: Judgment normal.      Lab Results:  CBC    Component Value Date/Time   WBC 9.0 11/13/2021 1625   RBC 4.67 11/13/2021 1625   HGB 12.7 11/13/2021 1625   HGB 12.9 02/26/2014 0948   HCT 40.3 11/13/2021 1625   HCT 40.0 02/26/2014 0948   PLT 297 11/13/2021 1625   PLT 267 02/26/2014 0948   MCV 86.3 11/13/2021 1625   MCV 88 02/26/2014 0948   MCH 27.2 11/13/2021 1625   MCHC 31.5 11/13/2021 1625   RDW 13.6 11/13/2021 1625   RDW 14.5 02/26/2014 0948   LYMPHSABS 2.6 11/13/2021 1625   LYMPHSABS 2.0 02/26/2014 0948   MONOABS 0.7 11/13/2021 1625   MONOABS 0.3 02/26/2014 0948   EOSABS 0.4 11/13/2021 1625   EOSABS 0.3 02/26/2014 0948   BASOSABS 0.0 11/13/2021 1625   BASOSABS 0.0 02/26/2014 0948    BMET    Component Value Date/Time   NA 138 10/16/2021 0855   NA 139 02/26/2014 0948   K 3.9 10/16/2021 0855   K 4.0 02/26/2014 0948   CL 108 10/16/2021 0855   CL 104 02/26/2014 0948   CO2 23 10/16/2021 0855   CO2 24 02/26/2014 0948   GLUCOSE 125 (H) 10/16/2021 0855   GLUCOSE 92 02/26/2014 0948   BUN 9 10/16/2021 0855   BUN 11 02/26/2014 0948   CREATININE 0.65 10/16/2021 0855   CREATININE 0.83 02/26/2014 0948   CALCIUM 9.0 10/16/2021 0855   CALCIUM 8.8 02/26/2014 0948   GFRNONAA >60 10/16/2021 0855   GFRNONAA >60 02/26/2014 0948   GFRAA >60 12/11/2019 1001   GFRAA >60 02/26/2014 0948    BNP    Component Value Date/Time   BNP 8.0 03/09/2017 0904    ProBNP No results found for: "PROBNP"  Imaging: No results found.   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 She will need a visit with our pharmacy team in [place].  - Sending RX prednisone taper '40mg'$  x 2 days, '30mg'$  x 2 days, '20mg'$  x 2 days; '10mg'$  x 2 days  - FU 4-6 weeks   GERD (gastroesophageal reflux disease) - She is having breakthrough reflux symptoms. Maintained on PPI, would benefit from addition of H2 blocker at bedtime.   Loud snoring - Sleep study in September 2023 was negative for OSA, AHI 1.8/hour with SpO2 low 83% (baseline 94%)     Martyn Ehrich, NP 03/21/2022

## 2022-03-21 NOTE — Telephone Encounter (Signed)
[place] patient. Needs to be started on biologic for eosinophile asthma with recurrent exacerbations - either fasenra or dupixent. We started paperwork for fasenra.

## 2022-03-21 NOTE — Telephone Encounter (Signed)
Enrollment form faxed to pharmacy team.

## 2022-03-27 ENCOUNTER — Telehealth: Payer: Self-pay | Admitting: Pharmacist

## 2022-03-27 NOTE — Telephone Encounter (Signed)
Received notification of Dupixent new start. Submitted a Prior Authorization request to [place] for [place] via CoverMyMeds. Will update once we receive a response.  Key: Chanetta Marshall, PharmD, MPH, BCPS, CPP Clinical Pharmacist (Rheumatology and Pulmonology)

## 2022-03-27 NOTE — Telephone Encounter (Signed)
Will start [place]. Patient has Medicaid and will not patient assistance application  Knox Saliva, PharmD, MPH, BCPS, CPP Clinical Pharmacist (Rheumatology and Pulmonology)

## 2022-04-18 ENCOUNTER — Telehealth: Payer: Medicaid Other | Admitting: Family

## 2022-04-18 DIAGNOSIS — J4521 Mild intermittent asthma with (acute) exacerbation: Secondary | ICD-10-CM

## 2022-04-18 MED ORDER — ALBUTEROL SULFATE HFA 108 (90 BASE) MCG/ACT IN AERS: 2.0000 | INHALATION_SPRAY | Freq: Four times a day (QID) | RESPIRATORY_TRACT | 0 refills | Status: DC | PRN

## 2022-04-18 MED ORDER — PREDNISONE 20 MG PO TABS: 40.0000 mg | ORAL_TABLET | Freq: Every day | ORAL | 0 refills | Status: AC

## 2022-04-18 NOTE — Progress Notes (Signed)
Visit for Asthma  Based on what you have shared with me, it looks like you may have a flare up of your asthma.  Asthma is a chronic (ongoing) lung disease which results in airway obstruction, inflammation and hyper-responsiveness.   Asthma symptoms vary from person to person, with common symptoms including nighttime awakening and decreased ability to participate in normal activities as a result of shortness of breath. It is often triggered by changes in weather, changes in the season, changes in air temperature, or inside (home, school, daycare or work) allergens such as animal dander, mold, mildew, woodstoves or cockroaches.   It can also be triggered by hormonal changes, extreme emotion, physical exertion or an upper respiratory tract illness.     It is important to identify the trigger, and then eliminate or avoid the trigger if possible.   If you have been prescribed medications to be taken on a regular basis, it is important to follow the asthma action plan and to follow guidelines to adjust medication in response to increasing symptoms of decreased peak expiratory flow rate  Treatment: I have prescribed: Albuterol (Proventil HFA; Ventolin HFA) 108 (90 Base) MCG/ACT Inhaler 2 puffs into the lungs every six hours as needed for wheezing or shortness of breath and Prednisone 40mg by mouth per day for  7 days  HOME CARE . Only take medications as instructed by your medical team. . Consider wearing a mask or scarf to improve breathing air temperature have been shown to decrease irritation and decrease exacerbations . Get rest. . Taking a steamy shower or using a humidifier may help nasal congestion sand ease sore throat pain. You can place a towel over your head and breathe in the steam from hot water coming from a faucet. . Using a saline nasal spray works much the same way.  . Cough  drops, hare candies and sore throat lozenges may ease your cough.  . Avoid close contacts especially the very you and the elderly . Cover your mouth if you cough or sneeze . Always remember to wash your hands.    GET HELP RIGHT AWAY IF: . You develop worsening symptoms; breathlessness at rest, drowsy, confused or agitated, unable to speak in full sentences . You have coughing fits . You develop a severe headache or visual changes . You develop shortness of breath, difficulty breathing or start having chest pain . Your symptoms persist after you have completed your treatment plan . If your symptoms do not improve within 10 days  MAKE SURE YOU . Understand these instructions. . Will watch your condition. . Will get help right away if you are not doing well or get worse.   Your e-visit answers were reviewed by a board certified advanced clinical practitioner to complete your personal care plan, Depending upon the condition, your plan could have included both over the counter or prescription medications.  Please review your pharmacy choice. Your safety is important to us. If you have drug allergies check your prescription carefully. You can use MyChart to ask questions about today's visit, request a non-urgent call back, or ask for a work or school excuse for 24 hours related to this e-Visit. If it has been greater than 24 hours you will need to follow up with your provider, or enter a new e-Visit to address those concerns.  You will get an e-mail in the next two days asking about your experience. I hope that your e-visit has been valuable and will speed your recovery. Thank   you for using e-visits.   Approximately 5 minutes was spent documenting and reviewing patient's chart.   

## 2022-04-19 NOTE — Telephone Encounter (Signed)
Received a fax regarding Prior Authorization from [facility] for Sabrina Holder. Authorization has been DENIED because:    Must appeal by 05/26/22

## 2022-04-23 NOTE — Telephone Encounter (Signed)
Called patient regarding member appeal form that needs to be signed by he for [place] to process any [place] appeal.  Unable to reach and patient does not have VM set up. MyChart message sent to her.  Will draft up appeal letter in the meantime in preparation  Knox Saliva, PharmD, MPH, BCPS, CPP Clinical Pharmacist (Rheumatology and Pulmonology)

## 2022-04-23 NOTE — Telephone Encounter (Addendum)
Submitted an EXPEDITED appeal to [place] for [place].  Reference # [id number] Phone: [phone] Fax: [phone]  Based on dispense history, patient does appear to be somewat non-compliant with Advair and Spiriva (Advair was only filled twice year for 3 month supply). Nevertheless she has prednisone-dependent asthma and will try.  Knox Saliva, PharmD, MPH, BCPS, CPP Clinical Pharmacist (Rheumatology and Pulmonology)

## 2022-04-27 ENCOUNTER — Telehealth: Payer: Medicaid Other | Admitting: Nurse Practitioner

## 2022-04-27 DIAGNOSIS — Z207 Contact with and (suspected) exposure to pediculosis, acariasis and other infestations: Secondary | ICD-10-CM | POA: Diagnosis not present

## 2022-04-27 MED ORDER — PERMETHRIN 5 % EX CREA: 1.0000 | TOPICAL_CREAM | Freq: Once | CUTANEOUS | 1 refills | Status: AC

## 2022-04-27 NOTE — Progress Notes (Signed)
E-Visit for Scabies  We are sorry that you are not feeling well. Here is how we plan to help!  Based on what you shared with me it looks like you have scabies.  Scabies is an infection caused by very tiny mites that burrow into the skin.  They cause severe itching. Though children are most commonly infected, anyone can get scabies.  Scabies mites can pass from person to person through physical close contact.  They can also be passed through shared clothing, towels, and bedding.  Scabies infection is not usually dangerous, but it is uncomfortable.  Because it is so contagious, scabies should be treated immediately to keep the infection from spreading.  If you have children in day care, please have any exposed children examined by their pediatrician. .   I have prescribed Permethrin topical cream 5% thoroughly massage cream from head to soles of feet; leave on for 8 to 14 hours before removing (shower or bath). May repeat if living mites are observed 14 days after first treatment; one application is generally curative.    HOME CARE:  You should treat all members in your household whether they show symptoms or not. Wash towels, clothes, bed linens, cloth toys, hats, and other personal items in hot water and dry on high heat. Vacuum floors and furniture and throw away the bag afterwards. Keep your child home from day care or school until the morning after treatment for scabies. Notify your child's day care or school so that other children can be checked and treated.  GET HELP RIGHT AWAY IF:  The infected person has a fever, red streaks, pain, or swelling of the skin. Sores get worse or don't heal. New rashes appear or itching continues for more than 2 weeks after treatment.  MAKE SURE YOU:  Understand these instructions. Will watch your condition. Will get help right away if you are not doing well or get worse.  Thank you for choosing an e-visit.  Your e-visit answers were reviewed by a  board certified advanced clinical practitioner to complete your personal care plan. Depending upon the condition, your plan could have included both over the counter or prescription medications.  Please review your pharmacy choice. Make sure the pharmacy is open so you can pick up prescription now. If there is a problem, you may contact your provider through [employer] and have the prescription routed to another pharmacy.  Your safety is important to [place]. If you have drug allergies check your prescription carefully.   For the next 24 hours you can use MyChart to ask questions about today's visit, request a non-urgent call back, or ask for a work or school excuse. You will get an email in the next two days asking about your experience. I hope that your e-visit has been valuable and will speed your recovery.  Meds ordered this encounter  Medications   permethrin (ELIMITE) 5 % cream    Sig: Apply 1 Application topically once for 1 dose. Apply from chin to toes at bedtime. Keep on overnight. Shower off in am. Do not apply to genital region or face    Dispense:  60 g    Refill:  1    I spent approximately 5 minutes reviewing the patient's history, current symptoms and coordinating their care today.

## 2022-05-03 ENCOUNTER — Telehealth: Payer: Self-pay | Admitting: Internal Medicine

## 2022-05-03 DIAGNOSIS — J4541 Moderate persistent asthma with (acute) exacerbation: Secondary | ICD-10-CM

## 2022-05-03 MED ORDER — SPIRIVA RESPIMAT 1.25 MCG/ACT IN AERS: INHALATION_SPRAY | RESPIRATORY_TRACT | 5 refills | Status: DC

## 2022-05-03 MED ORDER — AZITHROMYCIN 250 MG PO TABS: ORAL_TABLET | ORAL | 0 refills | Status: AC

## 2022-05-03 MED ORDER — PREDNISONE 20 MG PO TABS: 20.0000 mg | ORAL_TABLET | Freq: Every day | ORAL | 0 refills | Status: DC

## 2022-05-03 NOTE — Telephone Encounter (Signed)
Called and spoke. She is requesting prednisone.  C/o congestion, prod cough with clear sputum and wheezingx3d  SOB is baseline.  Denied f/c/s or additional sx.  She is taking mucinex once daily, ventolin TID and advair BID. She is put of spiriva. Rx sent to preferred pharmacy.  Dr. Mortimer Fries, please advise. Thanks

## 2022-05-03 NOTE — Telephone Encounter (Signed)
Patient is aware of recommendations and voiced her understanding. Zpak and prednisone has been sent to preferred pharmacy.  Nothing further needed.   

## 2022-05-15 ENCOUNTER — Ambulatory Visit (INDEPENDENT_AMBULATORY_CARE_PROVIDER_SITE_OTHER): Payer: Medicaid Other | Admitting: Internal Medicine

## 2022-05-15 ENCOUNTER — Encounter: Payer: Self-pay | Admitting: Internal Medicine

## 2022-05-15 VITALS — BP 128/78 | HR 108 | Temp 97.9°F | Ht 66.0 in | Wt 267.6 lb

## 2022-05-15 DIAGNOSIS — J4550 Severe persistent asthma, uncomplicated: Secondary | ICD-10-CM | POA: Diagnosis not present

## 2022-05-15 MED ORDER — BUDESONIDE 0.5 MG/2ML IN SUSP: 0.5000 mg | Freq: Two times a day (BID) | RESPIRATORY_TRACT | 0 refills | Status: DC

## 2022-05-15 MED ORDER — ARFORMOTEROL TARTRATE 15 MCG/2ML IN NEBU: 15.0000 ug | INHALATION_SOLUTION | Freq: Two times a day (BID) | RESPIRATORY_TRACT | 10 refills | Status: DC

## 2022-05-15 MED ORDER — PREDNISONE 20 MG PO TABS: 20.0000 mg | ORAL_TABLET | Freq: Every day | ORAL | 1 refills | Status: DC

## 2022-05-15 NOTE — Patient Instructions (Addendum)
Will start Pulmicort nebulizers twice daily Will start Brovana nebulizers twice daily Continue DuoNebs as needed Prednisone 20 mg daily for 10 days   Recommend holding Advair and Spiriva at this time  Avoid secondhand smoke Avoid SICK contacts Recommend  Masking  when appropriate Recommend Keep up-to-date with vaccinations   Good luck with surgery next week!

## 2022-05-15 NOTE — Progress Notes (Signed)
$@[email or web address] Holder$  ID: Sabrina Holder, female    DOB: [birth year], 42 y.o.   MRN: [id number]  CC Severe ASTHMA  HPI: 42 year old female, current smoker. PMH significant for moderate persistent asthma.   Previous [facility] pulmonary encounter:  03/08/2020  Patient contacted today for acute televisit. States that her breathing has been worse since Thursday 03/03/20. She has an associated dry cough. Cold weather exacerbates her asthma symptoms. She is compliant with Advair 500, Spiriva 1.63mg and Singulair. She has been using albuterol rescue inhaler 10 times a day with temporary improvement.  She does improve with oral prednisone, she last received prednsione 279mx 5 days from her PCP in November 2021. She works at the [employer]   09/29/2021 Patient contacted today for acute overview.  She reports symptoms of shortness of breath at night.  She has woken her self up at night gasping for air. She believes she has sleep apnea.  She has never had a sleep study. She is concerned about cost d/t having medicaid.  She also reports symptoms of increased cough and wheezing last several weeks.  She is currently using Advair 500-5043m1 puff twice daily.  She is not currently taking Spiriva or Singulair.  She has associated allergy symptoms including nasal congestion/postnasal drip.   Asthma - Mild exacerbation.  Sending in prednisone taper.  Advised patient resume Singulair 10 mg at bedtime and Spiriva Respimat 1.25 mcg 2 puffs once daily.  Continue Advair 500-69m58m puff twice daily.  Recommend checking CBC with differential and respiratory allergy panel at follow-up.    8/14/202 Patient presents today for asthma follow-up/sleep consult. She was seen for virtual visit back in June for mild asthma exacerbation. She is maintained on Advair 500-69mc44me puff twice daily. We recently added Spiriva respimat 1.25mcg58m puffs daily to her regimen and advised that she resume Singulair.  She saw some  improvement in her breathing with recent prednisone taper but continues to have upper airway wheezing.  She is not currently taking omeprazole as she thought this is what caused her to have a GI bleed.  He is compliant with both Advair and Spiriva. She has resumed Singulair as instructed. Chest x-ray in mid July that showed unchanged opacities overlying bilateral lower lungs, favored to represent overlying breast tissue.  No discrete focal airspace opacities.  Evidence of edema.   She has symptoms of loud snoring and witnessed apnea. She has woken herself up on numerous occasional snoring/gasping for air. She has associated daytime sleepiness. She has had no formal sleep studies, she has been ordered for study in the past but could not afford her copay. Typical bedtime is between 10-11pm. It can take her several hours to fall asleep. She wakes up 4-5 times a night. She starts her day at 6:30am. Denies narcolepsy, cataplexy or sleep walking.    05/15/2022 Patient presents today for follow-up asthma Patient with severe asthma symptoms and wheezing Patient has been trying traditional inhaler therapy for the last several years At this time patient will need nebulized therapy as needed maintenance therapy Patient has been assessed for biological immunological therapy At this time the insurance process is taking a long time Patient is asking for prednisone therapy at this time Eos were 400 in August, IgE 13  Regarding OSA Sleep study in September was negative for OSA, AHI 1.8/hour with SpO2 low 83% (baseline 94%)   [facility] scheduled to  have surgery next week   No Known Allergies  Immunization History  Administered Date(s) Administered   Influenza-Unspecified 01/01/2022    Past Medical History:  Diagnosis Date   Anemia    Anxiety    Asthma    Depression    Environmental allergies    GERD (gastroesophageal reflux disease)    Renal disorder      Tobacco History: Social History   Tobacco Use  Smoking Status Former   Packs/day: 1.00   Years: 20.00   Total pack years: 20.00   Types: Cigarettes   Quit date: 11/01/2021   Years since quitting: 0.5  Smokeless Tobacco Never  Tobacco Comments   Quit in 2022 and recently started back.  Stopped again a week and a half ago on wellbutrin.  11/13/2021 hfb   Counseling given: Not Answered Tobacco comments: Quit in 2022 and recently started back.  Stopped again a week and a half ago on wellbutrin.  11/13/2021 hfb   Outpatient Medications Prior to Visit  Medication Sig Dispense Refill   albuterol (PROVENTIL) (2.5 MG/3ML) 0.083% nebulizer solution Take 3 mLs (2.5 mg total) by nebulization every 6 (six) hours as needed for wheezing or shortness of breath. 75 mL 0   albuterol (VENTOLIN HFA) 108 (90 Base) MCG/ACT inhaler Inhale 2 puffs into the lungs every 6 (six) hours as needed for wheezing or shortness of breath. 8 g 0   benzonatate (TESSALON) 100 MG capsule Take 2 capsules (200 mg total) by mouth every 8 (eight) hours. 21 capsule 0   buPROPion (WELLBUTRIN XL) 300 MG 24 hr tablet Take 300 mg by mouth daily.     escitalopram (LEXAPRO) 20 MG tablet Take 20 mg by mouth daily.   1   famotidine (PEPCID) 20 MG tablet Take 1 tablet (20 mg total) by mouth at bedtime. 30 tablet 5   fexofenadine (ALLEGRA) 180 MG tablet Take 180 mg by mouth daily.     fexofenadine-pseudoephedrine (ALLEGRA-D) 60-120 MG 12 hr tablet Take 1 tablet by mouth every 12 (twelve) hours. 30 tablet 0   fluticasone (FLONASE) 50 MCG/ACT nasal spray Place 2 sprays into both nostrils daily. 16 g 0   fluticasone-salmeterol (ADVAIR) 500-50 MCG/ACT AEPB Inhale 1 puff into the lungs in the morning and at bedtime. 60 each 11   ipratropium (ATROVENT) 0.03 % nasal spray Place 2 sprays into both nostrils every 12 (twelve) hours. 30 mL 0   metFORMIN (GLUCOPHAGE) 500 MG tablet Take 1,000 mg by mouth daily with breakfast.     montelukast  (SINGULAIR) 10 MG tablet Take 1 tablet (10 mg total) by mouth daily. 30 tablet 11   pantoprazole (PROTONIX) 40 MG tablet Take 1 tablet (40 mg total) by mouth daily. 30 tablet 1   promethazine-dextromethorphan (PROMETHAZINE-DM) 6.25-15 MG/5ML syrup Take 5 mLs by mouth 4 (four) times daily as needed. 118 mL 0   promethazine-phenylephrine (PROMETHAZINE VC) 6.25-5 MG/5ML SYRP Take 5 mLs by mouth every 6 (six) hours as needed for congestion. 180 mL 0   Tiotropium Bromide Monohydrate (SPIRIVA RESPIMAT) 1.25 MCG/ACT AERS Take 2 puffs by mouth once daily in the morning 4 g 5   predniSONE (DELTASONE) 20 MG tablet Take 1 tablet (20 mg total) by mouth daily with breakfast. 7 tablet 0   No facility-administered medications prior to visit.   Review of Systems    Physical Exam  BP 128/78 (BP Location: Left Arm, Cuff Size: Large)   Pulse (!) 108   Temp 97.9 F (36.6 C) (Temporal)  Ht 5' 6"$  (1.676 m)   Wt 267 lb 9.6 oz (121.4 kg)   LMP 10/20/2019 (Approximate)   SpO2 95%   BMI 43.19 kg/m        Review of Systems: Gen:  Denies  fever, sweats, chills weight loss  HEENT: Denies blurred vision, double vision, ear pain, eye pain, hearing loss, nose bleeds, sore throat Cardiac:  No dizziness, chest pain or heaviness, chest tightness,edema, No JVD Resp:   No cough, -sputum production, +shortness of breath,+wheezing, -hemoptysis,  Other:  All other systems negative    Physical Examination:   General Appearance: No distress  EYES PERRLA, EOM intact.   NECK Supple, No JVD Pulmonary: normal breath sounds,+ wheezing.  CardiovascularNormal S1,S2.  No m/r/g.   Abdomen: Benign, Soft, non-tender. ALL OTHER ROS ARE NEGATIVE       Lab Results:  CBC    Component Value Date/Time   WBC 9.0 11/13/2021 1625   RBC 4.67 11/13/2021 1625   HGB 12.7 11/13/2021 1625   HGB 12.9 02/26/2014 0948   HCT 40.3 11/13/2021 1625   HCT 40.0 02/26/2014 0948   PLT 297 11/13/2021 1625   PLT 267 02/26/2014  0948   MCV 86.3 11/13/2021 1625   MCV 88 02/26/2014 0948   MCH 27.2 11/13/2021 1625   MCHC 31.5 11/13/2021 1625   RDW 13.6 11/13/2021 1625   RDW 14.5 02/26/2014 0948   LYMPHSABS 2.6 11/13/2021 1625   LYMPHSABS 2.0 02/26/2014 0948   MONOABS 0.7 11/13/2021 1625   MONOABS 0.3 02/26/2014 0948   EOSABS 0.4 11/13/2021 1625   EOSABS 0.3 02/26/2014 0948   BASOSABS 0.0 11/13/2021 1625   BASOSABS 0.0 02/26/2014 0948    BMET    Component Value Date/Time   NA 138 10/16/2021 0855   NA 139 02/26/2014 0948   K 3.9 10/16/2021 0855   K 4.0 02/26/2014 0948   CL 108 10/16/2021 0855   CL 104 02/26/2014 0948   CO2 23 10/16/2021 0855   CO2 24 02/26/2014 0948   GLUCOSE 125 (H) 10/16/2021 0855   GLUCOSE 92 02/26/2014 0948   BUN 9 10/16/2021 0855   BUN 11 02/26/2014 0948   CREATININE 0.65 10/16/2021 0855   CREATININE 0.83 02/26/2014 0948   CALCIUM 9.0 10/16/2021 0855   CALCIUM 8.8 02/26/2014 0948   GFRNONAA >60 10/16/2021 0855   GFRNONAA >60 02/26/2014 0948   GFRAA >60 12/11/2019 1001   GFRAA >60 02/26/2014 0948      Assessment & Plan:   42 year old pleasant African-American female with morbid obesity with very persistent asthma with ongoing wheezing in the setting of deconditioned state and at this time has failed outpatient traditional inhaler therapy and will need maintenance therapy as nebulized therapy  Severe persistent asthma Patient asking for oral prednisone at this time Patient is being assessed for biological agent therapy Will stop traditional inhaler therapy and will prescribe Pulmicort and Brovana nebulized therapy as maintenance therapy   GERD (gastroesophageal reflux disease) - She is having breakthrough reflux symptoms. Maintained on PPI, would benefit from addition of H2 blocker at bedtime.   Loud snoring - Sleep study in September 2023 was negative for OSA, AHI 1.8/hour with SpO2 low 83% (baseline 94%)   Obesity -recommend significant weight loss -recommend  changing diet Patient being [facility] at [facility]  Deconditioned state -Recommend increased daily activity and exercise     MEDICATION ADJUSTMENTS/LABS AND TESTS ORDERED: Will start Pulmicort nebulizers twice daily Will start Brovana nebulizers twice daily Continue  DuoNebs as needed Prednisone 20 mg daily for 10 days   Recommend holding Advair and Spiriva at this time  Avoid secondhand smoke Avoid SICK contacts Recommend  Masking  when appropriate Recommend Keep up-to-date with vaccinations   CURRENT MEDICATIONS REVIEWED AT [place]   Patient  satisfied with Plan of action and management. All questions answered  Follow up  3 months  Total Time Spent  34 mins   Maretta Bees Patricia Pesa, M.D.  [facility]  Medical Director [place] Director [facility] Cardio-Pulmonary Department

## 2022-05-28 ENCOUNTER — Ambulatory Visit: Payer: Medicaid Other | Attending: Otolaryngology

## 2022-05-28 DIAGNOSIS — Z6841 Body Mass Index (BMI) 40.0 and over, adult: Secondary | ICD-10-CM | POA: Insufficient documentation

## 2022-05-28 DIAGNOSIS — G4761 Periodic limb movement disorder: Secondary | ICD-10-CM | POA: Diagnosis not present

## 2022-05-28 DIAGNOSIS — G4736 Sleep related hypoventilation in conditions classified elsewhere: Secondary | ICD-10-CM | POA: Diagnosis not present

## 2022-05-28 DIAGNOSIS — R0683 Snoring: Secondary | ICD-10-CM | POA: Diagnosis not present

## 2022-05-30 ENCOUNTER — Telehealth: Payer: Medicaid Other | Admitting: Family

## 2022-05-30 ENCOUNTER — Telehealth: Payer: Medicaid Other | Admitting: Family Medicine

## 2022-05-30 DIAGNOSIS — B3731 Acute candidiasis of vulva and vagina: Secondary | ICD-10-CM | POA: Diagnosis not present

## 2022-05-30 DIAGNOSIS — N760 Acute vaginitis: Secondary | ICD-10-CM | POA: Diagnosis not present

## 2022-05-30 DIAGNOSIS — B9689 Other specified bacterial agents as the cause of diseases classified elsewhere: Secondary | ICD-10-CM

## 2022-05-30 DIAGNOSIS — J4521 Mild intermittent asthma with (acute) exacerbation: Secondary | ICD-10-CM

## 2022-05-30 MED ORDER — ALBUTEROL SULFATE HFA 108 (90 BASE) MCG/ACT IN AERS: 2.0000 | INHALATION_SPRAY | Freq: Four times a day (QID) | RESPIRATORY_TRACT | 0 refills | Status: DC | PRN

## 2022-05-30 MED ORDER — BENZONATATE 100 MG PO CAPS: 100.0000 mg | ORAL_CAPSULE | Freq: Three times a day (TID) | ORAL | 0 refills | Status: DC | PRN

## 2022-05-30 MED ORDER — METRONIDAZOLE 500 MG PO TABS: 500.0000 mg | ORAL_TABLET | Freq: Two times a day (BID) | ORAL | 0 refills | Status: AC

## 2022-05-30 MED ORDER — FLUCONAZOLE 150 MG PO TABS: 150.0000 mg | ORAL_TABLET | Freq: Every day | ORAL | 0 refills | Status: DC

## 2022-05-30 NOTE — Progress Notes (Signed)
E-Visit for Vaginal Symptoms  We are sorry that you are not feeling well. Here is how we plan to help! Based on what you shared with me it looks like you: May have a vaginosis due to bacteria  Vaginosis is an inflammation of the vagina that can result in discharge, itching and pain. The cause is usually a change in the normal balance of vaginal bacteria or an infection. Vaginosis can also result from reduced estrogen levels after menopause.  The most common causes of vaginosis are:   Bacterial vaginosis which results from an overgrowth of one on several organisms that are normally present in your vagina.   Yeast infections which are caused by a naturally occurring fungus called candida.   Vaginal atrophy (atrophic vaginosis) which results from the thinning of the vagina from reduced estrogen levels after menopause.   Trichomoniasis which is caused by a parasite and is commonly transmitted by sexual intercourse.  Factors that increase your risk of developing vaginosis include: Medications, such as antibiotics and steroids Uncontrolled diabetes Use of hygiene products such as bubble bath, vaginal spray or vaginal deodorant Douching Wearing damp or tight-fitting clothing Using an intrauterine device (IUD) for birth control Hormonal changes, such as those associated with pregnancy, birth control pills or menopause Sexual activity Having a sexually transmitted infection  Your treatment plan is Metronidazole or Flagyl '500mg'$  twice a day for 7 days.  I have electronically sent this prescription into the pharmacy that you have chosen. And Diflucan to treat yeast.  Be sure to take all of the medication as directed. Stop taking any medication if you develop a rash, tongue swelling or shortness of breath. Mothers who are breast feeding should consider pumping and discarding their breast milk while on these antibiotics. However, there is no consensus that infant exposure at these doses would be  harmful.  Remember that medication creams can weaken latex condoms. Marland Kitchen   HOME CARE:  Good hygiene may prevent some types of vaginosis from recurring and may relieve some symptoms:  Avoid baths, hot tubs and whirlpool spas. Rinse soap from your outer genital area after a shower, and dry the area well to prevent irritation. Don't use scented or harsh soaps, such as those with deodorant or antibacterial action. Avoid irritants. These include scented tampons and pads. Wipe from front to back after using the toilet. Doing so avoids spreading fecal bacteria to your vagina.  Other things that may help prevent vaginosis include:  Don't douche. Your vagina doesn't require cleansing other than normal bathing. Repetitive douching disrupts the normal organisms that reside in the vagina and can actually increase your risk of vaginal infection. Douching won't clear up a vaginal infection. Use a latex condom. Both female and female latex condoms may help you avoid infections spread by sexual contact. Wear cotton underwear. Also wear pantyhose with a cotton crotch. If you feel comfortable without it, skip wearing underwear to bed. Yeast thrives in [employer] Your symptoms should improve in the next day or two.  GET HELP RIGHT AWAY IF:  You have pain in your lower abdomen ( pelvic area or over your ovaries) You develop nausea or vomiting You develop a fever Your discharge changes or worsens You have persistent pain with intercourse You develop shortness of breath, a rapid pulse, or you faint.  These symptoms could be signs of problems or infections that need to be evaluated by a medical provider now.  MAKE SURE YOU   Understand these instructions. Will watch your  condition. Will get help right away if you are not doing well or get worse.  Thank you for choosing an e-visit.  Your e-visit answers were reviewed by a board certified advanced clinical practitioner to complete your personal care  plan. Depending upon the condition, your plan could have included both over the counter or prescription medications.  Please review your pharmacy choice. Make sure the pharmacy is open so you can pick up prescription now. If there is a problem, you may contact your provider through [employer] and have the prescription routed to another pharmacy.  Your safety is important to [place]. If you have drug allergies check your prescription carefully.   For the next 24 hours you can use MyChart to ask questions about today's visit, request a non-urgent call back, or ask for a work or school excuse. You will get an email in the next two days asking about your experience. I hope that your e-visit has been valuable and will speed your recovery.  I provided 5 minutes of non face-to-face time during this encounter for chart review, medication and order placement, as well as and documentation.

## 2022-05-30 NOTE — Progress Notes (Signed)
Visit for Asthma  Based on what you have shared with me, it looks like you may have a flare up of your asthma.  Asthma is a chronic (ongoing) lung disease which results in airway obstruction, inflammation and hyper-responsiveness.   Asthma symptoms vary from person to person, with common symptoms including nighttime awakening and decreased ability to participate in normal activities as a result of shortness of breath. It is often triggered by changes in weather, changes in the season, changes in air temperature, or inside (home, school, daycare or work) allergens such as animal dander, mold, mildew, woodstoves or cockroaches.   It can also be triggered by hormonal changes, extreme emotion, physical exertion or an upper respiratory tract illness.     It is important to identify the trigger, and then eliminate or avoid the trigger if possible.   If you have been prescribed medications to be taken on a regular basis, it is important to follow the asthma action plan and to follow guidelines to adjust medication in response to increasing symptoms of decreased peak expiratory flow rate  Treatment: I have prescribed: Albuterol (Proventil HFA; Ventolin HFA) 108 (90 Base) MCG/ACT Inhaler 2 puffs into the lungs every six hours as needed for wheezing or shortness of breath. I have also sent in tessalon for your cough. It looks like you just completed prednisone.   HOME CARE Only take medications as instructed by your medical team. Consider wearing a mask or scarf to improve breathing air temperature have been shown to decrease irritation and decrease exacerbations Get rest. Taking a steamy shower or using a humidifier may help nasal congestion sand ease sore throat pain. You can place a towel over your head and breathe in the steam from hot water coming from a faucet. Using a saline nasal spray  works much the same way.  Cough drops, hare candies and sore throat lozenges may ease your cough.  Avoid close contacts especially the very you and the elderly Cover your mouth if you cough or sneeze Always remember to wash your hands.    GET HELP RIGHT AWAY IF: You develop worsening symptoms; breathlessness at rest, drowsy, confused or agitated, unable to speak in full sentences You have coughing fits You develop a severe headache or visual changes You develop shortness of breath, difficulty breathing or start having chest pain Your symptoms persist after you have completed your treatment plan If your symptoms do not improve within 10 days  MAKE SURE YOU Understand these instructions. Will watch your condition. Will get help right away if you are not doing well or get worse.   Your e-visit answers were reviewed by a board certified advanced clinical practitioner to complete your personal care plan, Depending upon the condition, your plan could have included both over the counter or prescription medications.   Please review your pharmacy choice. Your safety is important to [place]. If you have drug allergies check your prescription carefully.  You can use MyChart to ask questions about today's visit, request a non-urgent  call back, or ask for a work or school excuse for 24 hours related to this e-Visit. If it has been greater than 24 hours you will need to follow up with your provider, or enter a new e-Visit to address those concerns.   You will get an e-mail in the next two days asking about your experience. I hope that your e-visit has been valuable and will speed your recovery. Thank you for using e-visits.  Approximately 5 minutes  was spent documenting and reviewing patient's chart.

## 2022-05-31 ENCOUNTER — Telehealth: Payer: Self-pay | Admitting: Internal Medicine

## 2022-05-31 ENCOUNTER — Other Ambulatory Visit (HOSPITAL_COMMUNITY): Payer: Self-pay

## 2022-05-31 NOTE — Telephone Encounter (Signed)
Called insurance for update on Dupixent appeal. Per test claim, copay for 28 day supply is $4 (for loading dose and maintenance dose). Per test claim, PA appears to be on file through 10/23/22   ATC patient to schedule Dupixent new start but phone rang and ended in busy tone. Unable to leave VM. Mychart message sent   Knox Saliva, PharmD, MPH, BCPS, CPP Clinical Pharmacist (Rheumatology and Pulmonology)

## 2022-05-31 NOTE — Telephone Encounter (Signed)
Patient called in today asking for her sleep study results. When I called [facility] asking for the results. Jenny Reichmann stated that the patient rescheduled to be done on 05/28/22 and it is still pending

## 2022-05-31 NOTE — Telephone Encounter (Signed)
Called and spoke to patient.  She is requesting update on Dupixent injection?   Pharmacy team, please advise. Thanks

## 2022-05-31 NOTE — Telephone Encounter (Signed)
Called insurance for update on Dupixent appeal. Per test claim, copay for 28 day supply is $4 (for loading dose and maintenance dose). Per test claim, PA appears to be on file through 10/23/22  ATC patient to schedule Dupixent new start but phone rang and ended in busy tone. Unable to leave VM. Mychart message sent  Knox Saliva, PharmD, MPH, BCPS, CPP Clinical Pharmacist (Rheumatology and Pulmonology)

## 2022-06-02 ENCOUNTER — Emergency Department
Admission: EM | Admit: 2022-06-02 | Discharge: 2022-06-02 | Disposition: A | Discharge status: Home / Self Care | Payer: Medicaid Other | Attending: Emergency Medicine | Admitting: Emergency Medicine

## 2022-06-02 ENCOUNTER — Other Ambulatory Visit: Payer: Self-pay

## 2022-06-02 ENCOUNTER — Emergency Department: Payer: Medicaid Other

## 2022-06-02 DIAGNOSIS — Z20822 Contact with and (suspected) exposure to covid-19: Secondary | ICD-10-CM | POA: Insufficient documentation

## 2022-06-02 DIAGNOSIS — J069 Acute upper respiratory infection, unspecified: Secondary | ICD-10-CM

## 2022-06-02 DIAGNOSIS — R059 Cough, unspecified: Secondary | ICD-10-CM | POA: Diagnosis present

## 2022-06-02 DIAGNOSIS — J4541 Moderate persistent asthma with (acute) exacerbation: Secondary | ICD-10-CM

## 2022-06-02 DIAGNOSIS — J4550 Severe persistent asthma, uncomplicated: Secondary | ICD-10-CM

## 2022-06-02 LAB — RESP PANEL BY RT-PCR (RSV, FLU A&B, COVID)  RVPGX2
Influenza A by PCR: NEGATIVE
Influenza B by PCR: NEGATIVE
Resp Syncytial Virus by PCR: NEGATIVE
SARS Coronavirus 2 by RT PCR: NEGATIVE

## 2022-06-02 MED ORDER — ALBUTEROL SULFATE HFA 108 (90 BASE) MCG/ACT IN AERS: 2.0000 | INHALATION_SPRAY | Freq: Four times a day (QID) | RESPIRATORY_TRACT | 1 refills | Status: DC | PRN

## 2022-06-02 MED ORDER — BENZONATATE 100 MG PO CAPS: 100.0000 mg | ORAL_CAPSULE | Freq: Three times a day (TID) | ORAL | 0 refills | Status: AC | PRN

## 2022-06-02 MED ORDER — PREDNISONE 50 MG PO TABS: 50.0000 mg | ORAL_TABLET | Freq: Every day | ORAL | 0 refills | Status: AC

## 2022-06-02 MED ORDER — PREDNISONE 20 MG PO TABS
60.0000 mg | ORAL_TABLET | Freq: Once | ORAL | Status: AC
  Administered 2022-06-02: 60 mg via ORAL
  Filled 2022-06-02: qty 3

## 2022-06-02 MED ORDER — IPRATROPIUM-ALBUTEROL 0.5-2.5 (3) MG/3ML IN SOLN
6.0000 mL | Freq: Once | RESPIRATORY_TRACT | Status: AC
  Administered 2022-06-02: 6 mL via RESPIRATORY_TRACT
  Filled 2022-06-02: qty 6

## 2022-06-02 MED ORDER — ALBUTEROL SULFATE (2.5 MG/3ML) 0.083% IN NEBU: 2.5000 mg | INHALATION_SOLUTION | Freq: Four times a day (QID) | RESPIRATORY_TRACT | 1 refills | Status: DC | PRN

## 2022-06-02 NOTE — Discharge Instructions (Addendum)
You were seen in the emergency department for shortness of breath and cough.  Your chest x-ray did not show any signs of a pneumonia.  Concerned that you are having an asthma exacerbation from a viral illness.  Stay hydrated and drink plenty of fluids.  You can check your COVID and influenza testing on your MyChart.  Prednisone -you are given a prescription for a steroid.  It is important that you take this medication with food.  This medication can cause an upset stomach.  It also can increase your glucose if you have a history of diabetes, so it is important that you check your glucose frequently while you are on this medication.

## 2022-06-02 NOTE — ED Triage Notes (Addendum)
Pt here with an asthma flare up. Pt states she cannot get phlegm out of her throat and she had a recent gastric bypass surgery. Pt denies NVD. Pt states a little SOB. Pt states she tried both of her inhalers with no relief.

## 2022-06-02 NOTE — ED Provider Notes (Signed)
[facility] Provider Note    Event Date/Time   First MD Initiated Contact with Patient 06/02/22 [phone]     (approximate)   History   Asthma   HPI  Sabrina Holder is a 42 y.o. female past medical history significant for asthma, anxiety, anemia, GERD, presents to the emergency department with cough.  Endorses a cough for the past 2 to 3 days.  States that she has been having wheezing and using her home nebulizer treatments with minimal improvement.  Denies any significant shortness of breath.  Sputum production that is clear.  Denies any nausea vomiting or diarrhea.  Denies any fever or chills.  No chest pain.  No swelling to her legs.  No history of DVT or PE.  Currently followed by pulmonology.  Denies recent hospitalizations or steroid use.     Physical Exam   Triage Vital Signs: ED Triage Vitals  Enc Vitals Group     BP 06/02/22 0833 123/83     Pulse Rate 06/02/22 0833 97     Resp 06/02/22 0833 20     Temp 06/02/22 0833 98 F (36.7 C)     Temp Source 06/02/22 0833 Oral     SpO2 06/02/22 0833 94 %     Weight 06/02/22 0832 247 lb (112 kg)     Height 06/02/22 0832 '5\' 6"'$  (1.676 m)     Head Circumference --      Peak Flow --      Pain Score 06/02/22 0831 0     Pain Loc --      Pain Edu? --      Excl. in [place]? --     Most recent vital signs: Vitals:   06/02/22 0833 06/02/22 0840  BP: 123/83 116/85  Pulse: 97 89  Resp: 20 15  Temp: 98 F (36.7 C)   SpO2: 94% 96%    Physical Exam Constitutional:      Appearance: She is well-developed.  HENT:     Head: Atraumatic.  Eyes:     Conjunctiva/sclera: Conjunctivae normal.  Cardiovascular:     Rate and Rhythm: Regular rhythm.  Pulmonary:     Effort: No respiratory distress.     Breath sounds: Wheezing present.     Comments: Diffuse inspiratory and expiratory wheezing throughout all lung fields, speaking in full sentences Abdominal:     General: There is no distension.     Comments:  Abdominal wounds clean dry and intact with Dermabond  Musculoskeletal:        General: Normal range of motion.     Cervical back: Normal range of motion.     Right lower leg: No edema.     Left lower leg: No edema.  Skin:    General: Skin is warm.  Neurological:     Mental Status: She is alert. Mental status is at baseline.     IMPRESSION / MDM / ASSESSMENT AND PLAN / ED COURSE  I reviewed the triage vital signs and the nursing notes.  Differential diagnosis including pneumonia, viral illness including COVID/influenza, asthma exacerbation.  No chest pain, have a low suspicion for pulmonary embolism or ACS.  No symptoms of a GI bleed.  EKG  I, Nathaniel Man, the attending physician, personally viewed and interpreted this ECG.   Rate: Normal  Rhythm: Normal sinus  Axis: Normal  Intervals: Normal  ST&T Change: None  No tachycardic or bradycardic dysrhythmias while on cardiac telemetry.  RADIOLOGY I independently reviewed imaging,  my interpretation of imaging: Chest x-ray with no signs of pneumonia.  Read as no acute findings  LABS (all labs ordered are listed, but only abnormal results are displayed) Labs interpreted as -  COVID and influenza testing obtained results are currently pending  Labs Reviewed  RESP PANEL BY RT-PCR (RSV, FLU A&B, COVID)  [id number]    TREATMENT  DuoNebs, prednisone  MDM    Patient most likely with asthma exacerbation.  On reevaluation no signs of respiratory distress.  Most likely secondary to a viral illness.  Will do a course of prednisone, discussed checking her sugars frequently.  Do not feel that antibiotics are necessary at this time.  Refilled albuterol nebulizer and inhaler.  Given prescription for [facility] for symptomatic treatment.  Given return precautions.   PROCEDURES:  Critical Care performed: No  Procedures  Patient's presentation is most consistent with acute presentation with potential threat to life or bodily  function.   MEDICATIONS ORDERED IN ED: Medications  ipratropium-albuterol (DUONEB) 0.5-2.5 (3) MG/3ML nebulizer solution 6 mL (6 mLs Nebulization Given 06/02/22 0854)  predniSONE (DELTASONE) tablet 60 mg (60 mg Oral Given 06/02/22 0853)    FINAL CLINICAL IMPRESSION(S) / ED DIAGNOSES   Final diagnoses:  Moderate persistent asthma with exacerbation  Upper respiratory tract infection, unspecified type     Rx / DC Orders   ED Discharge Orders          Ordered    albuterol (PROVENTIL) (2.5 MG/3ML) 0.083% nebulizer solution  Every 6 hours PRN        06/02/22 0850    albuterol (VENTOLIN HFA) 108 (90 Base) MCG/ACT inhaler  Every 6 hours PRN        06/02/22 0850    benzonatate (TESSALON PERLES) 100 MG capsule  3 times daily PRN        06/02/22 0850    predniSONE (DELTASONE) 50 MG tablet  Daily with breakfast        06/02/22 0850             Note:  This document was prepared using Dragon voice recognition software and may include unintentional dictation errors.   Nathaniel Man, MD 06/02/22 [phone]

## 2022-06-05 ENCOUNTER — Other Ambulatory Visit (HOSPITAL_COMMUNITY): Payer: Self-pay

## 2022-06-05 ENCOUNTER — Ambulatory Visit: Payer: Medicaid Other | Admitting: Pharmacist

## 2022-06-05 ENCOUNTER — Telehealth (INDEPENDENT_AMBULATORY_CARE_PROVIDER_SITE_OTHER): Payer: Medicaid Other | Admitting: Internal Medicine

## 2022-06-05 ENCOUNTER — Other Ambulatory Visit: Payer: Self-pay

## 2022-06-05 DIAGNOSIS — G4734 Idiopathic sleep related nonobstructive alveolar hypoventilation: Secondary | ICD-10-CM

## 2022-06-05 DIAGNOSIS — R0683 Snoring: Secondary | ICD-10-CM

## 2022-06-05 DIAGNOSIS — Z7189 Other specified counseling: Secondary | ICD-10-CM

## 2022-06-05 DIAGNOSIS — J4550 Severe persistent asthma, uncomplicated: Secondary | ICD-10-CM

## 2022-06-05 MED ORDER — DUPIXENT 300 MG/2ML ~~LOC~~ SOAJ
300.0000 mg | SUBCUTANEOUS | 0 refills | Status: DC
  Filled 2022-06-05: qty 12, 84d supply, fill #0
  Filled 2022-06-08: qty 4, 28d supply, fill #0
  Filled 2022-06-27: qty 4, 28d supply, fill #1
  Filled 2022-08-02: qty 4, 28d supply, fill #2

## 2022-06-05 NOTE — Patient Instructions (Addendum)
Your next [place] dose is due on 06/19/22, 07/03/22, and every 14 days thereafter   CONTINUE arformoterol nebs 73mg twice daily, , budesonide neb twice daily, montelukast '10mg'$  nightly.  Continue Duonebs as needed     [facility] to [facility] . After that the, pharmacy will call monthly. Their phone number is [phone]     You will need to be seen by your provider in 3 to 4 months to assess how DVictoriais working for you. Please ensure you have a follow-up appointment scheduled in June or July 2024. Call our clinic if you need to make this appointment.   How to manage an injection site reaction: Remember the 5 C's: COUNTER - leave on the counter at least 30 minutes but up to overnight to bring medication to room temperature. This may help prevent stinging COLD - place something cold (like an ice gel pack or cold water bottle) on the injection site just before cleansing with alcohol. This may help reduce pain CLARITIN - use Claritin (generic name is loratadine) for the first two weeks of treatment or the day of, the day before, and the day after injecting. This will help to minimize injection site reactions CORTISONE CREAM - apply if injection site is irritated and itching CALL ME - if injection site reaction is bigger than the size of your fist, looks infected, blisters, or if you develop hives

## 2022-06-05 NOTE — Telephone Encounter (Signed)
Patient would like results of sleep test. Patient phone number is [phone].

## 2022-06-05 NOTE — Progress Notes (Signed)
HPI Patient presents today to [facility] to see pharmacy team for [place] new start.  Past medical history includes severe persistent, steroid-dependent asthma, GERD  She is eager to start [place] to reduce steroid burden. She has received prednisone tapers > 15 times in last year. She states that she is generally asymptomatic for 2 weeks after finishing prednisone taper but then symptoms will recur.   She feels comfortable with self-administered injections as she does currently self-administer Ozempic  Respiratory Medications Current regimen: arformoterol nebs 23mg twice daily, , budesonide nebs twice daily, montelukast '10mg'$  nightly Tried in past: Spiriva + Advair (switched to nebs on 05/15/22 by Dr. KMortimer Fries Patient reports no known adherence challenges  OBJECTIVE No Known Allergies  Outpatient Encounter Medications as of 06/05/2022  Medication Sig   albuterol (PROVENTIL) (2.5 MG/3ML) 0.083% nebulizer solution Take 3 mLs (2.5 mg total) by nebulization every 6 (six) hours as needed for wheezing or shortness of breath.   albuterol (VENTOLIN HFA) 108 (90 Base) MCG/ACT inhaler Inhale 2 puffs into the lungs every 6 (six) hours as needed for wheezing or shortness of breath.   arformoterol (BROVANA) 15 MCG/2ML NEBU Take 2 mLs (15 mcg total) by nebulization 2 (two) times daily.   benzonatate (TESSALON PERLES) 100 MG capsule Take 1 capsule (100 mg total) by mouth 3 (three) times daily as needed for up to 7 days for cough.   budesonide (PULMICORT) 0.5 MG/2ML nebulizer solution Take 2 mLs (0.5 mg total) by nebulization 2 (two) times daily.   buPROPion (WELLBUTRIN XL) 300 MG 24 hr tablet Take 300 mg by mouth daily.   escitalopram (LEXAPRO) 20 MG tablet Take 20 mg by mouth daily.    famotidine (PEPCID) 20 MG tablet Take 1 tablet (20 mg total) by mouth at bedtime.   fexofenadine (ALLEGRA) 180 MG tablet Take 180 mg by mouth daily.   fexofenadine-pseudoephedrine (ALLEGRA-D) 60-120 MG 12 hr tablet  Take 1 tablet by mouth every 12 (twelve) hours.   fluconazole (DIFLUCAN) 150 MG tablet Take 1 tablet (150 mg total) by mouth daily.   fluticasone (FLONASE) 50 MCG/ACT nasal spray Place 2 sprays into both nostrils daily.   fluticasone-salmeterol (ADVAIR) 500-50 MCG/ACT AEPB Inhale 1 puff into the lungs in the morning and at bedtime.   ipratropium (ATROVENT) 0.03 % nasal spray Place 2 sprays into both nostrils every 12 (twelve) hours.   metFORMIN (GLUCOPHAGE) 500 MG tablet Take 1,000 mg by mouth daily with breakfast.   metroNIDAZOLE (FLAGYL) 500 MG tablet Take 1 tablet (500 mg total) by mouth 2 (two) times daily for 7 days.   montelukast (SINGULAIR) 10 MG tablet Take 1 tablet (10 mg total) by mouth daily.   pantoprazole (PROTONIX) 40 MG tablet Take 1 tablet (40 mg total) by mouth daily.   predniSONE (DELTASONE) 50 MG tablet Take 1 tablet (50 mg total) by mouth daily with breakfast for 5 days.   Tiotropium Bromide Monohydrate (SPIRIVA RESPIMAT) 1.25 MCG/ACT AERS Take 2 puffs by mouth once daily in the morning   [DISCONTINUED] ferrous sulfate 325 (65 FE) MG tablet Take 1 tablet (325 mg total) by mouth daily.   [DISCONTINUED] sertraline (ZOLOFT) 50 MG tablet Take 1 tablet (50 mg total) by mouth daily.   No facility-administered encounter medications on file as of 06/05/2022.     Immunization History  Administered Date(s) Administered   Influenza-Unspecified 01/01/2022     PFTs     No data to display          Eosinophils  Most recent blood eosinophil count was 400 cells/microL taken on 11/13/2021 (high eos despite prednisone dependence).   IgE: 13 on 11/13/2021  Assessment   Biologics training for dupilumab (Dupixent)  Goals of therapy: Mechanism: human monoclonal IgG4 antibody that inhibits interleukin-4 and interleukin-13 cytokine-induced responses, including release of proinflammatory cytokines, chemokines, and IgE Reviewed that Dupixent is add-on medication and patient must continue  maintenance inhaler regimen. Response to therapy: may take 4 months to determine efficacy. Discussed that patients generally feel improvement sooner than 4 months.  Side effects: injection site reaction (6-18%), antibody development (5-16%), ophthalmic conjunctivitis (2-16%), transient blood eosinophilia (1-2%)  Dose: '600mg'$  at Week 0 (administered today in clinic) followed by '300mg'$  every 14 days thereafter  Administration/Storage:  Reviewed administration sites of thigh or abdomen (at least 2-3 inches away from abdomen). Reviewed the upper arm is only appropriate if caregiver is administering injection  Do not shake pen/syringe as this could lead to product foaming or precipitation. Do not use if solution is discolored or contains particulate matter or if window on prefilled pen is yellow (indicates pen has been used).  Reviewed storage of medication in refrigerator. Reviewed that [place] can be stored at room temperature in unopened carton for up to 14 days.  Access: Approval of Dupixent through: insurance  Patient self-administered Dupixent '300mg'$ /14m x 2 (total dose '600mg'$ ) in left lower abdomen and left lower abdomen (4 inches from previous injection) using sample: Dupixent '300mg'$ /221mautoinjector pen x 2 pens NDC: [id number]: [id number]: 01/31/2024  Patient monitored for 30 minutes for adverse reaction.  Patient tolerated without issue. Injection site checked and no redness or swelling noted. Patient denies any itchiness and irritation  Medication Reconciliation  A drug regimen assessment was performed, including review of allergies, interactions, disease-state management, dosing and immunization history. Medications were reviewed with the patient, including name, instructions, indication, goals of therapy, potential side effects, importance of adherence, and safe use.  Drug interaction(s): none noted  PLAN Continue Dupixent '300mg'$  every 14 days.  Next dose is due  06/19/22 and every 14 days thereafter. [phone]  Patient provided with pharmacy phone number and advised that  [facility] call  to schedule shipment to home. [facility] regimen of: arformoterol nebs 1558mtwice daily, , budesonide nebs twice daily, montelukast '10mg'$  nightly  All questions encouraged and answered.  Instructed patient to reach out with any further questions or concerns.  Thank you for allowing pharmacy to participate in this patient's care.  This appointment required 60 minutes of patient care (this includes precharting, chart review, review of results, face-to-face care, etc.).  DevKnox SalivaharmD, MPH, BCPS, CPP Clinical Pharmacist (Rheumatology and Pulmonology)

## 2022-06-05 NOTE — Telephone Encounter (Addendum)
[name], can you check and see if her sleep study has been read? I do not see that is has been scanned into her chart. Last message said it was scheduled for 05/28/2022. Thank You!

## 2022-06-05 NOTE — Telephone Encounter (Signed)
I called [facility] and spoke with Mardene Celeste she stated that it hasn't been read yet

## 2022-06-06 NOTE — Telephone Encounter (Signed)
Patient is aware of below message and voiced her understanding. She is aware that we will contact her with results once available.

## 2022-06-06 NOTE — Telephone Encounter (Signed)
According to chart, patient had Dupixent injection yesterday.  Please see duplicate phone note dated 06/05/2022 in regards to sleep study.  Will close encounter.

## 2022-06-08 ENCOUNTER — Other Ambulatory Visit (HOSPITAL_COMMUNITY): Payer: Self-pay

## 2022-06-08 ENCOUNTER — Other Ambulatory Visit: Payer: Self-pay

## 2022-06-11 ENCOUNTER — Other Ambulatory Visit (HOSPITAL_COMMUNITY): Payer: Self-pay

## 2022-06-11 NOTE — Telephone Encounter (Signed)
Results have not been received from sleep center. Will continue to f/u.

## 2022-06-12 NOTE — Telephone Encounter (Signed)
NPSG did not show significant OSA. There was nocturnal hypoxia with sats ranging from 88 to 92%, evaluate for underlying causes

## 2022-06-13 DIAGNOSIS — R0683 Snoring: Secondary | ICD-10-CM | POA: Diagnosis not present

## 2022-06-13 DIAGNOSIS — G4734 Idiopathic sleep related nonobstructive alveolar hypoventilation: Secondary | ICD-10-CM | POA: Diagnosis not present

## 2022-06-14 NOTE — Telephone Encounter (Signed)
Lm for patient.  

## 2022-06-14 NOTE — Telephone Encounter (Signed)
Patient is aware of results and voiced her understanding.   Dr. Mortimer Fries, please advise on oxygen levels. Thanks

## 2022-06-18 NOTE — Addendum Note (Signed)
Addended by: Claudette Head A on: 06/18/2022 09:47 AM   Modules accepted: Orders

## 2022-06-18 NOTE — Telephone Encounter (Signed)
ONO ordered. ?Patient is aware and voiced her understanding.  ?Nothing further needed.  ? ?

## 2022-06-27 ENCOUNTER — Other Ambulatory Visit (HOSPITAL_COMMUNITY): Payer: Self-pay

## 2022-06-29 ENCOUNTER — Emergency Department
Admission: EM | Admit: 2022-06-29 | Discharge: 2022-06-29 | Disposition: A | Discharge status: Home / Self Care | Payer: Medicaid Other | Attending: Emergency Medicine | Admitting: Emergency Medicine

## 2022-06-29 DIAGNOSIS — E860 Dehydration: Secondary | ICD-10-CM | POA: Diagnosis not present

## 2022-06-29 DIAGNOSIS — R5383 Other fatigue: Secondary | ICD-10-CM | POA: Insufficient documentation

## 2022-06-29 LAB — CBC WITH DIFFERENTIAL/PLATELET
Abs Immature Granulocytes: 0.01 10*3/uL (ref 0.00–0.07)
Basophils Absolute: 0.1 10*3/uL (ref 0.0–0.1)
Basophils Relative: 1 %
Eosinophils Absolute: 0.7 10*3/uL — ABNORMAL HIGH (ref 0.0–0.5)
Eosinophils Relative: 13 %
HCT: 41 % (ref 36.0–46.0)
Hemoglobin: 13 g/dL (ref 12.0–15.0)
Immature Granulocytes: 0 %
Lymphocytes Relative: 39 %
Lymphs Abs: 2.1 10*3/uL (ref 0.7–4.0)
MCH: 27.4 pg (ref 26.0–34.0)
MCHC: 31.7 g/dL (ref 30.0–36.0)
MCV: 86.5 fL (ref 80.0–100.0)
Monocytes Absolute: 0.3 10*3/uL (ref 0.1–1.0)
Monocytes Relative: 5 %
Neutro Abs: 2.2 10*3/uL (ref 1.7–7.7)
Neutrophils Relative %: 42 %
Platelets: 309 10*3/uL (ref 150–400)
RBC: 4.74 MIL/uL (ref 3.87–5.11)
RDW: 13.2 % (ref 11.5–15.5)
WBC: 5.2 10*3/uL (ref 4.0–10.5)
nRBC: 0 % (ref 0.0–0.2)

## 2022-06-29 LAB — URINALYSIS, ROUTINE W REFLEX MICROSCOPIC
Bilirubin Urine: NEGATIVE
Glucose, UA: NEGATIVE mg/dL
Hgb urine dipstick: NEGATIVE
Ketones, ur: 20 mg/dL — AB
Leukocytes,Ua: NEGATIVE
Nitrite: NEGATIVE
Protein, ur: 30 mg/dL — AB
Specific Gravity, Urine: 1.029 (ref 1.005–1.030)
pH: 5 (ref 5.0–8.0)

## 2022-06-29 LAB — COMPREHENSIVE METABOLIC PANEL
ALT: 48 U/L — ABNORMAL HIGH (ref 0–44)
AST: 33 U/L (ref 15–41)
Albumin: 3.8 g/dL (ref 3.5–5.0)
Alkaline Phosphatase: 68 U/L (ref 38–126)
Anion gap: 11 (ref 5–15)
BUN: 12 mg/dL (ref 6–20)
CO2: 23 mmol/L (ref 22–32)
Calcium: 9.2 mg/dL (ref 8.9–10.3)
Chloride: 103 mmol/L (ref 98–111)
Creatinine, Ser: 0.7 mg/dL (ref 0.44–1.00)
GFR, Estimated: >60 mL/min (ref >60)
Glucose, Bld: 88 mg/dL (ref 70–99)
Potassium: 3.8 mmol/L (ref 3.5–5.1)
Sodium: 137 mmol/L (ref 135–145)
Total Bilirubin: 0.8 mg/dL (ref 0.3–1.2)
Total Protein: 6.3 g/dL — ABNORMAL LOW (ref 6.5–8.1)

## 2022-06-29 MED ORDER — SODIUM CHLORIDE 0.9 % IV BOLUS
1000.0000 mL | Freq: Once | INTRAVENOUS | Status: AC
  Administered 2022-06-29: 1000 mL via INTRAVENOUS

## 2022-06-29 NOTE — ED Triage Notes (Signed)
Patient C/O fatigue and dizziness that started about a week ago. 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 Denies pain and fever at this time.

## 2022-06-29 NOTE — ED Provider Notes (Signed)
[facility] Provider Note  Patient Contact: 8:53 PM (approximate)   History   Fatigue and Dizziness   HPI  Sabrina Holder is a 42 y.o. female who presents emergency department for evaluation of possible dehydration versus complications from her gastric sleeve.  Patient states that at the end the last month she had a gastric sleeve placed.  She has no abdominal pain, no fevers, no complications with the sleeve.  However the last week she has felt extremely fatigued.  She states that she is struggling to maintain her fluid intake as she has been taking smaller more regular meals and feels like after she eats she cannot drink as much fluid as she normally would.  She went to ensure that she did not have any significant dehydration.  Denies any abdominal pain, emesis, diarrhea, constipation.  No urinary changes.     Physical Exam   Triage Vital Signs: ED Triage Vitals  Enc Vitals Group     BP 06/29/22 1908 (!) 137/93     Pulse Rate 06/29/22 1908 83     Resp 06/29/22 1908 18     Temp 06/29/22 1908 98.4 F (36.9 C)     Temp Source 06/29/22 1908 Oral     SpO2 06/29/22 1908 96 %     Weight 06/29/22 1914 235 lb (106.6 kg)     Height 06/29/22 1914 5\' 4"  (1.626 m)     Head Circumference --      Peak Flow --      Pain Score 06/29/22 1914 0     Pain Loc --      Pain Edu? --      Excl. in [place]? --     Most recent vital signs: Vitals:   06/29/22 1908  BP: (!) 137/93  Pulse: 83  Resp: 18  Temp: 98.4 F (36.9 C)  SpO2: 96%     General: Alert and in no acute distress. ENT:      Ears:       Nose: No congestion/rhinnorhea.      Mouth/Throat: Mucous membranes are moist.   Cardiovascular:  Good peripheral perfusion Respiratory: Normal respiratory effort without tachypnea or retractions. Lungs CTAB. Gastrointestinal: Bowel sounds 4 quadrants. Soft and nontender to palpation. No guarding or rigidity. No palpable masses. No distention. No CVA  tenderness. Musculoskeletal: Full range of motion to all extremities.  Neurologic:  No gross focal neurologic deficits are appreciated.  Skin:   No rash noted Other:   ED Results / Procedures / Treatments   Labs (all labs ordered are listed, but only abnormal results are displayed) Labs Reviewed  CBC WITH DIFFERENTIAL/PLATELET - Abnormal; Notable for the following components:      Result Value   Eosinophils Absolute 0.7 (*)    All other components within normal limits  COMPREHENSIVE METABOLIC PANEL - Abnormal; Notable for the following components:   Total Protein 6.3 (*)    ALT 48 (*)    All other components within normal limits  URINALYSIS, ROUTINE W REFLEX MICROSCOPIC - Abnormal; Notable for the following components:   Color, Urine AMBER (*)    APPearance CLOUDY (*)    Ketones, ur 20 (*)    Protein, ur 30 (*)    Bacteria, UA RARE (*)    All other components within normal limits     EKG     RADIOLOGY    No results found.  PROCEDURES:  Critical Care performed: No  Procedures   MEDICATIONS  ORDERED IN ED: Medications  sodium chloride 0.9 % bolus 1,000 mL (0 mLs Intravenous Stopped 06/29/22 2202)     IMPRESSION / MDM / ASSESSMENT AND PLAN / ED COURSE  I reviewed the triage vital signs and the nursing notes.                                 Differential diagnosis includes, but is not limited to, viral illness, infection, UTI, dehydration  Patient's presentation is most consistent with acute presentation with potential threat to life or bodily function.   Patient's diagnosis is consistent with fatigue and dehydration.  Patient presents emergency department with fatigue, feeling dehydrated.  Patient recently had a gastric sleeve placed.  She states that it is changed her diet including her drinking patterns for typical water consumption.  Patient states that she has felt dry for the past week and more tired.  No fever, chills, chest pain, shortness of breath,  abdominal pain.  At this time I suspect after lengthy conversation with the patient that this is decreased food and fluid intake from her baseline.  Patient labs is overall reassuring.  She did have some ketones in her urine and slightly dry membranes.  As such gave her a liter of fluids and she feels much improved.  We had lengthy conversation on techniques and tricks to stay hydrated with a gastric sleeve.  Concerning signs and symptoms and return precaution discussed with the patient.  Follow-up primary care as needed..  Patient is given ED precautions to return to the ED for any worsening or new symptoms.     FINAL CLINICAL IMPRESSION(S) / ED DIAGNOSES   Final diagnoses:  Other fatigue  Dehydration     Rx / DC Orders   ED Discharge Orders     None        Note:  This document was prepared using Dragon voice recognition software and may include unintentional dictation errors.   Brynda Peon 06/29/22 2227    Rada Hay, MD 06/30/22 2329

## 2022-06-29 NOTE — ED Notes (Signed)
Pt denies weakness and dizziness upon discharge.  Pt ambulates without assistance.  Pt has a steady gait

## 2022-07-09 ENCOUNTER — Other Ambulatory Visit: Payer: Self-pay

## 2022-07-19 ENCOUNTER — Telehealth: Payer: Self-pay

## 2022-07-19 NOTE — Telephone Encounter (Signed)
ONO reviewed by Dr. Kasa-- no oxygen needed.   Lm for patient.  

## 2022-07-19 NOTE — Telephone Encounter (Signed)
Spoke to patient. She voiced understanding of results. Verified that she did not have additional questions.

## 2022-07-19 NOTE — Telephone Encounter (Signed)
PT ret call and expressed understanding of ONO results. Nothing further needed.

## 2022-08-02 ENCOUNTER — Other Ambulatory Visit (HOSPITAL_COMMUNITY): Payer: Self-pay

## 2022-08-03 ENCOUNTER — Other Ambulatory Visit (HOSPITAL_COMMUNITY): Payer: Self-pay

## 2022-08-17 ENCOUNTER — Other Ambulatory Visit: Payer: Self-pay

## 2022-08-17 ENCOUNTER — Emergency Department
Admission: EM | Admit: 2022-08-17 | Discharge: 2022-08-17 | Discharge status: Against Medical Advice | Payer: Medicaid Other | Attending: Emergency Medicine | Admitting: Emergency Medicine

## 2022-08-17 DIAGNOSIS — R42 Dizziness and giddiness: Secondary | ICD-10-CM | POA: Insufficient documentation

## 2022-08-17 DIAGNOSIS — Z5321 Procedure and treatment not carried out due to patient leaving prior to being seen by health care provider: Secondary | ICD-10-CM | POA: Diagnosis not present

## 2022-08-17 DIAGNOSIS — R531 Weakness: Secondary | ICD-10-CM | POA: Diagnosis not present

## 2022-08-17 LAB — CBC
HCT: 37 % (ref 36.0–46.0)
Hemoglobin: 12 g/dL (ref 12.0–15.0)
MCH: 28 pg (ref 26.0–34.0)
MCHC: 32.4 g/dL (ref 30.0–36.0)
MCV: 86.4 fL (ref 80.0–100.0)
Platelets: 273 10*3/uL (ref 150–400)
RBC: 4.28 MIL/uL (ref 3.87–5.11)
RDW: 13.8 % (ref 11.5–15.5)
WBC: 4.6 10*3/uL (ref 4.0–10.5)
nRBC: 0 % (ref 0.0–0.2)

## 2022-08-17 LAB — BASIC METABOLIC PANEL
Anion gap: 6 (ref 5–15)
BUN: 12 mg/dL (ref 6–20)
CO2: 23 mmol/L (ref 22–32)
Calcium: 8.2 mg/dL — ABNORMAL LOW (ref 8.9–10.3)
Chloride: 109 mmol/L (ref 98–111)
Creatinine, Ser: 0.6 mg/dL (ref 0.44–1.00)
GFR, Estimated: >60 mL/min (ref >60)
Glucose, Bld: 86 mg/dL (ref 70–99)
Potassium: 4 mmol/L (ref 3.5–5.1)
Sodium: 138 mmol/L (ref 135–145)

## 2022-08-17 LAB — URINALYSIS, ROUTINE W REFLEX MICROSCOPIC
Bilirubin Urine: NEGATIVE
Glucose, UA: NEGATIVE mg/dL
Hgb urine dipstick: NEGATIVE
Ketones, ur: NEGATIVE mg/dL
Leukocytes,Ua: NEGATIVE
Nitrite: NEGATIVE
Protein, ur: NEGATIVE mg/dL
Specific Gravity, Urine: 1.026 (ref 1.005–1.030)
pH: 6 (ref 5.0–8.0)

## 2022-08-17 NOTE — ED Notes (Signed)
Pt to ED via [facility] from home for dizziness and weakness. Pt had gastric sleeve in Feb. At [facility]. Pt is not reporting any pain at this time. Pt was given 250-300 CC of NS by EMS in route. Pt has 20 G IV in her right hand. Pt CBG 101 with EMS and blood pressure was 141/86 when sitting and 129/86 when standing. Pt is in NAD.

## 2022-08-17 NOTE — ED Notes (Signed)
Called x1

## 2022-08-17 NOTE — ED Triage Notes (Signed)
See 1st RN note: Pt here with dizziness and weakness. Pt has hx of a gastric sleeve in Feb, pt denies pain. Pt had positive orthostatics with ems.

## 2022-08-17 NOTE — ED Notes (Signed)
Pregnancy Test negative.

## 2022-08-28 ENCOUNTER — Other Ambulatory Visit: Payer: Self-pay | Admitting: Internal Medicine

## 2022-08-28 ENCOUNTER — Other Ambulatory Visit: Payer: Self-pay

## 2022-08-28 ENCOUNTER — Other Ambulatory Visit (HOSPITAL_COMMUNITY): Payer: Self-pay

## 2022-08-28 DIAGNOSIS — J4550 Severe persistent asthma, uncomplicated: Secondary | ICD-10-CM

## 2022-08-28 MED ORDER — BUDESONIDE 0.5 MG/2ML IN SUSP
0.5000 mg | Freq: Two times a day (BID) | RESPIRATORY_TRACT | 0 refills | Status: DC
  Filled 2022-08-28: qty 120, 30d supply, fill #0
  Filled 2022-10-02: qty 120, 30d supply, fill #1

## 2022-08-28 MED ORDER — DUPIXENT 300 MG/2ML ~~LOC~~ SOAJ
300.0000 mg | SUBCUTANEOUS | 0 refills | Status: DC
  Filled 2022-08-28: qty 12, 84d supply, fill #0
  Filled 2022-08-29: qty 4, 28d supply, fill #0
  Filled 2022-09-27: qty 4, 28d supply, fill #1
  Filled 2022-10-02 – 2022-11-26 (×5): qty 4, 28d supply, fill #2

## 2022-08-29 ENCOUNTER — Other Ambulatory Visit (HOSPITAL_COMMUNITY): Payer: Self-pay

## 2022-08-29 ENCOUNTER — Other Ambulatory Visit: Payer: Self-pay

## 2022-09-03 ENCOUNTER — Other Ambulatory Visit (HOSPITAL_COMMUNITY): Payer: Self-pay

## 2022-09-27 ENCOUNTER — Telehealth: Payer: Medicaid Other | Admitting: Physician Assistant

## 2022-09-27 ENCOUNTER — Other Ambulatory Visit (HOSPITAL_COMMUNITY): Payer: Self-pay

## 2022-09-27 DIAGNOSIS — B3731 Acute candidiasis of vulva and vagina: Secondary | ICD-10-CM

## 2022-09-27 MED ORDER — FLUCONAZOLE 150 MG PO TABS: 150.0000 mg | ORAL_TABLET | Freq: Once | ORAL | 0 refills | Status: AC

## 2022-09-27 NOTE — Progress Notes (Signed)
I have spent 5 minutes in review of e-visit questionnaire, review and updating patient chart, medical decision making and response to patient.   Paisleigh Maroney Cody Clinton Wahlberg, PA-C    

## 2022-09-27 NOTE — Progress Notes (Signed)

## 2022-10-01 ENCOUNTER — Other Ambulatory Visit (HOSPITAL_COMMUNITY): Payer: Self-pay

## 2022-10-02 ENCOUNTER — Other Ambulatory Visit: Payer: Self-pay

## 2022-10-02 ENCOUNTER — Other Ambulatory Visit: Payer: Self-pay | Admitting: Physician Assistant

## 2022-10-02 ENCOUNTER — Other Ambulatory Visit (HOSPITAL_COMMUNITY): Payer: Self-pay

## 2022-10-02 DIAGNOSIS — R0982 Postnasal drip: Secondary | ICD-10-CM

## 2022-10-23 ENCOUNTER — Other Ambulatory Visit (HOSPITAL_COMMUNITY): Payer: Self-pay

## 2022-10-30 ENCOUNTER — Other Ambulatory Visit (HOSPITAL_COMMUNITY): Payer: Self-pay

## 2022-10-30 ENCOUNTER — Telehealth: Payer: Self-pay

## 2022-10-30 ENCOUNTER — Other Ambulatory Visit: Payer: Self-pay

## 2022-10-30 NOTE — Telephone Encounter (Signed)
Dr. Belia Heman can see her on Thursday (8/4) at 4:15pm.  ATC the patient. LVM for the patient to return my call.

## 2022-10-30 NOTE — Telephone Encounter (Signed)
Received notification from [facility] that pt's medication needs a new prior authorization, however pt has not had a f/u appt since initiating therapy. Unfortunately we never received any notification that her pending appeal had been overturned and she didn't actually start treatment until a month in to her approval window. Will need to reach out to the scheduling team and try to get the pt scheduled soon.

## 2022-10-31 ENCOUNTER — Other Ambulatory Visit: Payer: Self-pay

## 2022-10-31 ENCOUNTER — Other Ambulatory Visit (HOSPITAL_COMMUNITY): Payer: Self-pay

## 2022-10-31 NOTE — Telephone Encounter (Signed)
Lm x2 for patient. Will call once more due to nature of call.   

## 2022-11-01 ENCOUNTER — Telehealth (INDEPENDENT_AMBULATORY_CARE_PROVIDER_SITE_OTHER): Payer: Medicaid Other | Admitting: Internal Medicine

## 2022-11-01 ENCOUNTER — Encounter: Payer: Self-pay | Admitting: Internal Medicine

## 2022-11-01 DIAGNOSIS — R5383 Other fatigue: Secondary | ICD-10-CM

## 2022-11-01 DIAGNOSIS — R0683 Snoring: Secondary | ICD-10-CM

## 2022-11-01 DIAGNOSIS — N920 Excessive and frequent menstruation with regular cycle: Secondary | ICD-10-CM

## 2022-11-01 DIAGNOSIS — J4540 Moderate persistent asthma, uncomplicated: Secondary | ICD-10-CM | POA: Diagnosis not present

## 2022-11-01 DIAGNOSIS — E669 Obesity, unspecified: Secondary | ICD-10-CM | POA: Diagnosis not present

## 2022-11-01 DIAGNOSIS — K219 Gastro-esophageal reflux disease without esophagitis: Secondary | ICD-10-CM | POA: Diagnosis not present

## 2022-11-01 MED ORDER — MONTELUKAST SODIUM 10 MG PO TABS: 10.0000 mg | ORAL_TABLET | Freq: Every day | ORAL | 11 refills | Status: DC

## 2022-11-01 NOTE — Telephone Encounter (Signed)
Spoke to patient. She is requesting virtual visit, as she recently started a new job and unable to take off.  Mychart visit scheduled 11/01/2022 at 1:45. Nothing further needed.

## 2022-11-01 NOTE — Patient Instructions (Signed)
Avoid secondhand smoke Avoid SICK contacts Recommend  Masking  when appropriate Recommend Keep up-to-date with vaccinations Continue biological therapy as this is helping tremendously

## 2022-11-01 NOTE — Progress Notes (Signed)
@Patient  ID: Sabrina Holder, female    DOB: Aug 24, 1980, 42 y.o.   MRN: [id number]       VIDEO VISIT    In the setting of the current Covid19 crisis, you are scheduled for a (video) visit with your provider 11/01/2022 date/time 415PM Just as we do with many in-office visits, in order for you to participate in this visit, we must obtain consent.   I can obtain your verbal consent now.  PATIENT AGREES AND CONFIRMS -YES This Visit has Audio and Visual Capabilities for optimal patient care experience   Evaluation Performed:  Follow-up visit/CONSULT  Patient at home Position in office    CC Follow up ASTHMA  HPI: 42 year old female, current smoker. PMH significant for moderate persistent asthma.   11/01/2022 Patient presents today for follow-up asthma Patient with previous history severe asthma symptoms and wheezing Started on Dupixent March 2024 Patient has weaned off all nebulizer treatments and maintenance inhaler therapy Patient uses albuterol inhaler as needed very infrequently No exacerbation at this time  Patient lost 100 pounds over the last several months Patient is more active  No exacerbation at this time No evidence of heart failure at this time No evidence or signs of infection at this time No respiratory distress No fevers, chills, nausea, vomiting, diarrhea No evidence of lower extremity edema No evidence hemoptysis  Patient stopped smoking a year ago   No Known Allergies  Immunization History  Administered Date(s) Administered   Influenza-Unspecified 01/01/2022    Past Medical History:  Diagnosis Date   Anemia    Anxiety    Asthma    Depression    Environmental allergies    GERD (gastroesophageal reflux disease)    Renal disorder     Tobacco History: Social History   Tobacco Use  Smoking Status Former   Current packs/day: 0.00   Average packs/day: 1 pack/day for 20.0 years (20.0 ttl pk-yrs)   Types: Cigarettes   Start date:  11/01/2001   Quit date: 11/01/2021   Years since quitting: 1.0  Smokeless Tobacco Never  Tobacco Comments   Quit in 2022 and recently started back.  Stopped again a week and a half ago on wellbutrin.  11/13/2021 hfb   Counseling given: Not Answered Tobacco comments: Quit in 2022 and recently started back.  Stopped again a week and a half ago on wellbutrin.  11/13/2021 hfb   Outpatient Medications Prior to Visit  Medication Sig Dispense Refill   albuterol (PROVENTIL) (2.5 MG/3ML) 0.083% nebulizer solution Take 3 mLs (2.5 mg total) by nebulization every 6 (six) hours as needed for wheezing or shortness of breath. 75 mL 1   albuterol (VENTOLIN HFA) 108 (90 Base) MCG/ACT inhaler Inhale 2 puffs into the lungs every 6 (six) hours as needed for wheezing or shortness of breath. 8 g 1   arformoterol (BROVANA) 15 MCG/2ML NEBU Take 2 mLs (15 mcg total) by nebulization 2 (two) times daily. 120 mL 10   budesonide (PULMICORT) 0.5 MG/2ML nebulizer solution Take 2 mLs (0.5 mg total) by nebulization 2 (two) times daily. 1440 mL 0   buPROPion (WELLBUTRIN XL) 300 MG 24 hr tablet Take 300 mg by mouth daily.     Dupilumab (DUPIXENT) 300 MG/2ML SOPN Inject 300 mg into the skin every 14 (fourteen) days. 12 mL 0   escitalopram (LEXAPRO) 20 MG tablet Take 20 mg by mouth daily.   1   famotidine (PEPCID) 20 MG tablet Take 1 tablet (20 mg total) by  mouth at bedtime. 30 tablet 5   fexofenadine (ALLEGRA) 180 MG tablet Take 180 mg by mouth daily.     fexofenadine-pseudoephedrine (ALLEGRA-D) 60-120 MG 12 hr tablet Take 1 tablet by mouth every 12 (twelve) hours. 30 tablet 0   fluticasone (FLONASE) 50 MCG/ACT nasal spray Place 2 sprays into both nostrils daily. 16 g 0   ipratropium (ATROVENT) 0.03 % nasal spray Place 2 sprays into both nostrils every 12 (twelve) hours. 30 mL 0   metFORMIN (GLUCOPHAGE) 500 MG tablet Take 1,000 mg by mouth daily with breakfast.     montelukast (SINGULAIR) 10 MG tablet Take 1 tablet (10 mg total) by  mouth daily. 30 tablet 11   pantoprazole (PROTONIX) 40 MG tablet Take 1 tablet (40 mg total) by mouth daily. 30 tablet 1   No facility-administered medications prior to visit.   Review of Systems    Physical Exam  LMP 10/20/2019 (Approximate)        Review of Systems:  Gen:  Denies  fever, sweats, chills weight loss  HEENT: Denies blurred vision, double vision, ear pain, eye pain, hearing loss, nose bleeds, sore throat Cardiac:  No dizziness, chest pain or heaviness, chest tightness,edema, No JVD Resp:   No cough, -sputum production, -shortness of breath,-wheezing, -hemoptysis,  Gi: Denies swallowing difficulty, stomach pain, nausea or vomiting, diarrhea, constipation, bowel incontinence Gu:  Denies bladder incontinence, burning urine Ext:   Denies Joint pain, stiffness or swelling Skin: Denies  skin rash, easy bruising or bleeding or hives Endoc:  Denies polyuria, polydipsia , polyphagia or weight change Psych:   Denies depression, insomnia or hallucinations  Other:  All other systems negative        Lab Results:  CBC    Component Value Date/Time   WBC 4.6 08/17/2022 1308   RBC 4.28 08/17/2022 1308   HGB 12.0 08/17/2022 1308   HGB 12.9 02/26/2014 0948   HCT 37.0 08/17/2022 1308   HCT 40.0 02/26/2014 0948   PLT 273 08/17/2022 1308   PLT 267 02/26/2014 0948   MCV 86.4 08/17/2022 1308   MCV 88 02/26/2014 0948   MCH 28.0 08/17/2022 1308   MCHC 32.4 08/17/2022 1308   RDW 13.8 08/17/2022 1308   RDW 14.5 02/26/2014 0948   LYMPHSABS 2.1 06/29/2022 1919   LYMPHSABS 2.0 02/26/2014 0948   MONOABS 0.3 06/29/2022 1919   MONOABS 0.3 02/26/2014 0948   EOSABS 0.7 (H) 06/29/2022 1919   EOSABS 0.3 02/26/2014 0948   BASOSABS 0.1 06/29/2022 1919   BASOSABS 0.0 02/26/2014 0948    BMET    Component Value Date/Time   NA 138 08/17/2022 1308   NA 139 02/26/2014 0948   K 4.0 08/17/2022 1308   K 4.0 02/26/2014 0948   CL 109 08/17/2022 1308   CL 104 02/26/2014 0948    CO2 23 08/17/2022 1308   CO2 24 02/26/2014 0948   GLUCOSE 86 08/17/2022 1308   GLUCOSE 92 02/26/2014 0948   BUN 12 08/17/2022 1308   BUN 11 02/26/2014 0948   CREATININE 0.60 08/17/2022 1308   CREATININE 0.83 02/26/2014 0948   CALCIUM 8.2 (L) 08/17/2022 1308   CALCIUM 8.8 02/26/2014 0948   GFRNONAA >60 08/17/2022 1308   GFRNONAA >60 02/26/2014 0948   GFRAA >60 12/11/2019 1001   GFRAA >60 02/26/2014 0948      Assessment & Plan:   42 year old obese pleasant African-American female with underlying moderate persistent asthma which is completely controlled at this time with biological therapy with Dupixent  Patient has been weaned off all maintenance inhalers and nebulized therapy Patient uses albuterol as needed and very infrequently  GERD (gastroesophageal reflux disease) Maintained on PPI  Loud snoring - Sleep study in September 2023 was negative for OSA, AHI 1.8/hour with SpO2 low 83% (baseline 94%)  Obesity -recommend significant weight loss -recommend changing diet Weight loss surgery several months ago patient lost 100 pounds  Deconditioned state -Recommend increased daily activity and exercise    MEDICATION ADJUSTMENTS/LABS AND TESTS ORDERED:  Avoid secondhand smoke Avoid SICK contacts Recommend  Masking  when appropriate Recommend Keep up-to-date with vaccinations Continue biological therapy as this is helping tremendously  CURRENT MEDICATIONS REVIEWED AT LENGTH WITH PATIENT TODAY   Patient  satisfied with Plan of action and management. All questions answered  Follow-up 1 year  Total Time Spent  24 mins   Denishia Citro Santiago Glad, M.D.  [facility]  Medical Director [facility] Medical Director [facility] Cardio-Pulmonary Department

## 2022-11-02 NOTE — Telephone Encounter (Signed)
Submitted a Prior Authorization renewal request to [facility] MEDICAID for DUPIXENT via CoverMyMeds. Will update once we receive a response.  Key: [id number]  Chesley Mires, PharmD, MPH, BCPS, CPP Clinical Pharmacist (Rheumatology and Pulmonology)

## 2022-11-06 ENCOUNTER — Other Ambulatory Visit (HOSPITAL_COMMUNITY): Payer: Self-pay

## 2022-11-06 ENCOUNTER — Telehealth: Payer: Self-pay | Admitting: Internal Medicine

## 2022-11-06 NOTE — Telephone Encounter (Signed)
Patient checking on prior authorization for Dupixent injection. Patient phone number is [phone].

## 2022-11-07 ENCOUNTER — Other Ambulatory Visit (HOSPITAL_COMMUNITY): Payer: Self-pay

## 2022-11-08 NOTE — Telephone Encounter (Signed)
Patient advised that appeal for Dupixent will be submitted. She plans to stop by clinic to pick up Dupixent sample today. Sample left in paper bag up front art room temperature.  Chesley Mires, PharmD, MPH, BCPS, CPP Clinical Pharmacist (Rheumatology and Pulmonology)

## 2022-11-08 NOTE — Telephone Encounter (Signed)
Received a fax regarding Prior Authorization from [facility] MEDICAID for DUPIXENT. Authorization has been DENIED because: Your provider did not send [place] information we requested.  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 (B) You depend on oral corticosteroids for treatment of your condition (with at least one month of daily oral corticosteroid use within the last three months). (2) You have poor symptom control after a minimum of three months of using an Inhaled corticosteroid and long acting beta-2 agonist within the past six months.  Will work on submitting urgent appeal  Chesley Mires, PharmD, MPH, BCPS, CPP Clinical Pharmacist (Rheumatology and Pulmonology)

## 2022-11-08 NOTE — Telephone Encounter (Signed)
Pt. Calling back her insurance is not going to cover the Dupixent and having breathing issues and wants to know if we can do another therapy due to she is havig breathing issues

## 2022-11-09 ENCOUNTER — Other Ambulatory Visit (HOSPITAL_COMMUNITY): Payer: Self-pay

## 2022-11-16 NOTE — Telephone Encounter (Signed)
Submitted an URGENT appeal to [facility] MEDICAID for DUPIXENT.  PA Case #: [id number]  Phone: [phone]  Fax: [phone]

## 2022-11-26 ENCOUNTER — Other Ambulatory Visit (HOSPITAL_COMMUNITY): Payer: Self-pay

## 2022-11-29 ENCOUNTER — Other Ambulatory Visit (HOSPITAL_COMMUNITY): Payer: Self-pay

## 2022-11-29 NOTE — Telephone Encounter (Signed)
Called [facility] Medicaid for update on Dupixent appeal. Per test claim, RTS and dispense history showed Dupixent was filled 11/27/22 but we never received determination. Per rep, denial is OVERTURNED> Dupixent is approved through 8/17/22024 through 11/17/2023. Rep will fax approval letter to clinic. Apparently voicemail was left with clinic but we never received?  Phone: [phone] Call ref # [id number]  Chesley Mires, PharmD, MPH, BCPS, CPP Clinical Pharmacist (Rheumatology and Pulmonology)

## 2022-12-13 ENCOUNTER — Other Ambulatory Visit: Payer: Self-pay | Admitting: Internal Medicine

## 2022-12-13 ENCOUNTER — Other Ambulatory Visit (HOSPITAL_COMMUNITY): Payer: Self-pay

## 2022-12-13 DIAGNOSIS — J4550 Severe persistent asthma, uncomplicated: Secondary | ICD-10-CM

## 2022-12-13 MED ORDER — DUPIXENT 300 MG/2ML ~~LOC~~ SOAJ
300.0000 mg | SUBCUTANEOUS | 0 refills | Status: DC
  Filled 2022-12-13: qty 4, 28d supply, fill #0
  Filled 2023-01-17 (×2): qty 4, 28d supply, fill #1
  Filled 2023-02-13: qty 4, 28d supply, fill #2

## 2022-12-17 ENCOUNTER — Ambulatory Visit
Admission: EM | Admit: 2022-12-17 | Discharge: 2022-12-17 | Disposition: A | Discharge status: Home / Self Care | Payer: Medicaid Other | Attending: Emergency Medicine | Admitting: Emergency Medicine

## 2022-12-17 ENCOUNTER — Encounter: Payer: Self-pay | Admitting: Emergency Medicine

## 2022-12-17 DIAGNOSIS — N611 Abscess of the breast and nipple: Secondary | ICD-10-CM

## 2022-12-17 MED ORDER — SULFAMETHOXAZOLE-TRIMETHOPRIM 800-160 MG PO TABS: 1.0000 | ORAL_TABLET | Freq: Two times a day (BID) | ORAL | 0 refills | Status: AC

## 2022-12-17 NOTE — ED Provider Notes (Signed)
[facility]    CSN: [id number] Arrival date & time: 12/17/22  1603      History   Chief Complaint Chief Complaint  Patient presents with   Abscess    HPI Sabrina Holder is a 42 y.o. female.   HPI  42 year old female with a past medical history significant for asthma, anxiety, anemia, GERD, renal disorder presents for evaluation of an abscess on the inner aspect of her left breast that has been present for last 2 weeks.  She reports that she had something similar last year that ruptured on its own but then returned.  She denies any pain, drainage, or fever.  Past Medical History:  Diagnosis Date   Anemia    Anxiety    Asthma    Depression    Environmental allergies    GERD (gastroesophageal reflux disease)    Renal disorder     Patient Active Problem List   Diagnosis Date Noted   Loud snoring 11/13/2021   GERD (gastroesophageal reflux disease) 11/13/2021   Menorrhagia with regular cycle 12/15/2019   Anemia due to chronic blood loss 12/15/2019   Class 2 obesity due to excess calories with body mass index (BMI) of 39.0 to 39.9 in adult 11/05/2017   Prediabetes 11/05/2017   Moderate asthma with acute exacerbation 03/29/2017    Past Surgical History:  Procedure Laterality Date   ABDOMINAL HYSTERECTOMY     CESAREAN SECTION     X 3   COLONOSCOPY N/A 12/26/2020   Procedure: COLONOSCOPY;  Surgeon: Jaynie Collins, DO;  Location: [facility] ENDOSCOPY;  Service: Gastroenterology;  Laterality: N/A;   CYSTOSCOPY N/A 12/15/2019   Procedure: CYSTOSCOPY;  Surgeon: Conard Novak, MD;  Location: [facility] ORS;  Service: Gynecology;  Laterality: N/A;   ESOPHAGOGASTRODUODENOSCOPY N/A 12/26/2020   Procedure: ESOPHAGOGASTRODUODENOSCOPY (EGD);  Surgeon: Jaynie Collins, DO;  Location: [facility] ENDOSCOPY;  Service: Gastroenterology;  Laterality: N/A;   TOTAL LAPAROSCOPIC HYSTERECTOMY WITH SALPINGECTOMY Bilateral 12/15/2019   Procedure: TOTAL LAPAROSCOPIC HYSTERECTOMY  WITH SALPINGECTOMY;  Surgeon: Conard Novak, MD;  Location: [facility] ORS;  Service: Gynecology;  Laterality: Bilateral;   TUBAL LIGATION      OB History     Gravida  6   Para  5   Term  5   Preterm      AB  1   Living  5      SAB      IAB  1   Ectopic      Multiple      Live Births  5            Home Medications    Prior to Admission medications   Medication Sig Start Date End Date Taking? Authorizing Provider  sulfamethoxazole-trimethoprim (BACTRIM DS) 800-160 MG tablet Take 1 tablet by mouth 2 (two) times daily for 7 days. 12/17/22 12/24/22 Yes Becky Augusta, NP  albuterol (PROVENTIL) (2.5 MG/3ML) 0.083% nebulizer solution Take 3 mLs (2.5 mg total) by nebulization every 6 (six) hours as needed for wheezing or shortness of breath. 06/02/22   Corena Herter, MD  albuterol (VENTOLIN HFA) 108 (90 Base) MCG/ACT inhaler Inhale 2 puffs into the lungs every 6 (six) hours as needed for wheezing or shortness of breath. 06/02/22   Corena Herter, MD  arformoterol (BROVANA) 15 MCG/2ML NEBU Take 2 mLs (15 mcg total) by nebulization 2 (two) times daily. 05/15/22   Erin Fulling, MD  budesonide (PULMICORT) 0.5 MG/2ML nebulizer solution Take 2 mLs (0.5 mg total) by nebulization  2 (two) times daily. 08/28/22 08/28/23  Erin Fulling, MD  buPROPion (WELLBUTRIN XL) 300 MG 24 hr tablet Take 300 mg by mouth daily. 08/11/19   [provider]  Dupilumab (DUPIXENT) 300 MG/2ML SOPN Inject 300 mg into the skin every 14 (fourteen) days. 12/13/22   Erin Fulling, MD  escitalopram (LEXAPRO) 20 MG tablet Take 20 mg by mouth daily.  09/03/17   [provider]  famotidine (PEPCID) 20 MG tablet Take 1 tablet (20 mg total) by mouth at bedtime. 03/21/22   Glenford Bayley, NP  fexofenadine (ALLEGRA) 180 MG tablet Take 180 mg by mouth daily.    [provider]  fexofenadine-pseudoephedrine (ALLEGRA-D) 60-120 MG 12 hr tablet Take 1 tablet by mouth every 12 (twelve) hours. 07/12/20    Caccavale, Sophia, PA-C  fluticasone (FLONASE) 50 MCG/ACT nasal spray Place 2 sprays into both nostrils daily. 09/09/21   Daphine Deutscher, Mary-Margaret, FNP  ipratropium (ATROVENT) 0.03 % nasal spray Place 2 sprays into both nostrils every 12 (twelve) hours. 12/11/21   Margaretann Loveless, PA-C  metFORMIN (GLUCOPHAGE) 500 MG tablet Take 1,000 mg by mouth daily with breakfast.    [provider]  montelukast (SINGULAIR) 10 MG tablet Take 1 tablet (10 mg total) by mouth daily. 11/01/22   Erin Fulling, MD  pantoprazole (PROTONIX) 40 MG tablet Take 1 tablet (40 mg total) by mouth daily. 12/11/21   Margaretann Loveless, PA-C  ferrous sulfate 325 (65 FE) MG tablet Take 1 tablet (325 mg total) by mouth daily. 10/16/19 04/02/20  Domenick Gong, MD  sertraline (ZOLOFT) 50 MG tablet Take 1 tablet (50 mg total) by mouth daily. 01/11/16 12/08/18  Payton Mccallum, MD    Family History Family History  Problem Relation Age of Onset   Asthma Mother    Gout Father    Cancer Maternal Grandmother 56       unknown type   Uterine cancer Neg Hx    Ovarian cancer Neg Hx    Breast cancer Neg Hx     Social History Social History   Tobacco Use   Smoking status: Former    Current packs/day: 0.00    Average packs/day: 1 pack/day for 20.0 years (20.0 ttl pk-yrs)    Types: Cigarettes    Start date: 11/01/2001    Quit date: 11/01/2021    Years since quitting: 1.1   Smokeless tobacco: Never   Tobacco comments:    Quit in 2022 and recently started back.  Stopped again a week and a half ago on wellbutrin.  11/13/2021 hfb  Vaping Use   Vaping status: Never Used  Substance Use Topics   Alcohol use: Yes    Comment: occasional (once weekly)   Drug use: No     Allergies   Patient has no known allergies.   Review of Systems Review of Systems  Constitutional:  Negative for fever.  Skin:  Positive for color change and wound.     Physical Exam Triage Vital Signs ED Triage Vitals [12/17/22 1629]  Encounter  Vitals Group     BP 128/81     Systolic BP Percentile      Diastolic BP Percentile      Pulse Rate 73     Resp 16     Temp 98.2 F (36.8 C)     Temp src      SpO2 100 %     Weight      Height      Head Circumference  Peak Flow      Pain Score 0     Pain Loc      Pain Education      Exclude from Growth Chart    No data found.  Updated Vital Signs BP 128/81 (BP Location: Left Arm)   Pulse 73   Temp 98.2 F (36.8 C)   Resp 16   LMP 10/20/2019 (Approximate)   SpO2 100%   Visual Acuity Right Eye Distance:   Left Eye Distance:   Bilateral Distance:    Right Eye Near:   Left Eye Near:    Bilateral Near:     Physical Exam Vitals and nursing note reviewed.  Constitutional:      Appearance: Normal appearance. She is not ill-appearing.  HENT:     Head: Normocephalic and atraumatic.  Skin:    General: Skin is warm and dry.     Capillary Refill: Capillary refill takes less than 2 seconds.     Findings: Erythema and lesion present.  Neurological:     General: No focal deficit present.     Mental Status: She is alert and oriented to person, place, and time.      UC Treatments / Results  Labs (all labs ordered are listed, but only abnormal results are displayed) Labs Reviewed - No data to display  EKG   Radiology No results found.  Procedures Procedures (including critical care time)  Medications Ordered in UC Medications - No data to display  Initial Impression / Assessment and Plan / UC Course  I have reviewed the triage vital signs and the nursing notes.  Pertinent labs & imaging results that were available during my care of the patient were reviewed by me and considered in my medical decision making (see chart for details).   Patient is a pleasant, nontoxic-appearing 42 year old female presenting for evaluation of breast abscess.  You can see in image above, there is an erythematous and edematous area on the medial aspect of the left breast that  is fluctuant.  It is not hot to touch or tender.  Patient reports that she had something similar a year ago that ruptured on its own and then reaccumulated.  I will discharge her home on Bactrim DS twice daily and have her apply warm compresses to the area to see if she can get it to rupture on its own.  I will also refer her to general surgery for excision of breast abscess.  Final Clinical Impressions(s) / UC Diagnoses   Final diagnoses:  Breast abscess     Discharge Instructions      Take the Bactrim twice daily with a full glass of water for 7 days for treatment of your breast abscess.  You may apply warm compresses to the abscess to see if you can get it to come to ahead and rupture on its own.  I have made a referral to general surgery for definitive management and treatment of your breast abscess.     ED Prescriptions     Medication Sig Dispense Auth. Provider   sulfamethoxazole-trimethoprim (BACTRIM DS) 800-160 MG tablet Take 1 tablet by mouth 2 (two) times daily for 7 days. 14 tablet Becky Augusta, NP      PDMP not reviewed this encounter.   Becky Augusta, NP 12/17/22 [phone]

## 2022-12-17 NOTE — ED Triage Notes (Signed)
Pt presents with an abscess on the left side of her breast x 2 weeks.

## 2022-12-17 NOTE — Discharge Instructions (Addendum)
Take the Bactrim twice daily with a full glass of water for 7 days for treatment of your breast abscess.  You may apply warm compresses to the abscess to see if you can get it to come to ahead and rupture on its own.  I have made a referral to general surgery for definitive management and treatment of your breast abscess.

## 2022-12-19 NOTE — Telephone Encounter (Signed)
Encounter created in error, please disregard.

## 2023-01-01 ENCOUNTER — Ambulatory Visit: Payer: Medicaid Other | Admitting: General Surgery

## 2023-01-17 ENCOUNTER — Other Ambulatory Visit: Payer: Self-pay

## 2023-01-17 ENCOUNTER — Ambulatory Visit: Payer: Medicaid Other | Admitting: Plastic Surgery

## 2023-01-17 ENCOUNTER — Encounter: Payer: Self-pay | Admitting: Plastic Surgery

## 2023-01-17 VITALS — Ht 64.0 in | Wt 184.0 lb

## 2023-01-17 DIAGNOSIS — M542 Cervicalgia: Secondary | ICD-10-CM | POA: Diagnosis not present

## 2023-01-17 DIAGNOSIS — M25511 Pain in right shoulder: Secondary | ICD-10-CM | POA: Diagnosis not present

## 2023-01-17 DIAGNOSIS — Z6831 Body mass index (BMI) 31.0-31.9, adult: Secondary | ICD-10-CM

## 2023-01-17 DIAGNOSIS — M25512 Pain in left shoulder: Secondary | ICD-10-CM

## 2023-01-17 DIAGNOSIS — N62 Hypertrophy of breast: Secondary | ICD-10-CM | POA: Diagnosis not present

## 2023-01-17 DIAGNOSIS — Z9884 Bariatric surgery status: Secondary | ICD-10-CM

## 2023-01-17 NOTE — Progress Notes (Signed)
Specialty Pharmacy Refill Coordination Note  Dalia Jollie is a 42 y.o. female contacted today regarding refills of specialty medication(s) Dupilumab   Patient requested Delivery   Delivery date: 01/22/23   Verified address: [address]   [place] [id number]   Medication will be filled on 01/21/23.

## 2023-01-17 NOTE — Progress Notes (Signed)
Referring Provider Center, [facility] [address] [place],  [place] [address]   CC:  Chief Complaint  Patient presents with   Consult      Sabrina Holder is an 42 y.o. female.  HPI: Sabrina Holder is a 42 year old female who presents today with complaints of upper back and neck pain of many years duration which she feels is secondary to large size of her breast.  She states that she is unable to find bras that fit appropriately and the bras that she does find tend to take into her shoulders and have caused permanent grooving and scarring.  Of note the patient has undergone bariatric surgery and has had a 100 pound weight loss.  Despite this weight loss she still has large pendulous breasts and has had persistent pain in her neck and shoulders.  She is requesting a breast reduction.  No Known Allergies  Outpatient Encounter Medications as of 01/17/2023  Medication Sig   albuterol (PROVENTIL) (2.5 MG/3ML) 0.083% nebulizer solution Take 3 mLs (2.5 mg total) by nebulization every 6 (six) hours as needed for wheezing or shortness of breath.   albuterol (VENTOLIN HFA) 108 (90 Base) MCG/ACT inhaler Inhale 2 puffs into the lungs every 6 (six) hours as needed for wheezing or shortness of breath.   arformoterol (BROVANA) 15 MCG/2ML NEBU Take 2 mLs (15 mcg total) by nebulization 2 (two) times daily.   budesonide (PULMICORT) 0.5 MG/2ML nebulizer solution Take 2 mLs (0.5 mg total) by nebulization 2 (two) times daily.   buPROPion (WELLBUTRIN XL) 300 MG 24 hr tablet Take 300 mg by mouth daily.   Dupilumab (DUPIXENT) 300 MG/2ML SOPN Inject 300 mg into the skin every 14 (fourteen) days.   escitalopram (LEXAPRO) 20 MG tablet Take 20 mg by mouth daily.    famotidine (PEPCID) 20 MG tablet Take 1 tablet (20 mg total) by mouth at bedtime.   fexofenadine (ALLEGRA) 180 MG tablet Take 180 mg by mouth daily.   fexofenadine-pseudoephedrine (ALLEGRA-D) 60-120 MG 12 hr tablet Take 1  tablet by mouth every 12 (twelve) hours.   fluticasone (FLONASE) 50 MCG/ACT nasal spray Place 2 sprays into both nostrils daily.   ipratropium (ATROVENT) 0.03 % nasal spray Place 2 sprays into both nostrils every 12 (twelve) hours.   metFORMIN (GLUCOPHAGE) 500 MG tablet Take 1,000 mg by mouth daily with breakfast.   montelukast (SINGULAIR) 10 MG tablet Take 1 tablet (10 mg total) by mouth daily.   pantoprazole (PROTONIX) 40 MG tablet Take 1 tablet (40 mg total) by mouth daily.   [DISCONTINUED] ferrous sulfate 325 (65 FE) MG tablet Take 1 tablet (325 mg total) by mouth daily.   [DISCONTINUED] sertraline (ZOLOFT) 50 MG tablet Take 1 tablet (50 mg total) by mouth daily.   No facility-administered encounter medications on file as of 01/17/2023.     Past Medical History:  Diagnosis Date   Anemia    Anxiety    Asthma    Depression    Environmental allergies    GERD (gastroesophageal reflux disease)    Renal disorder     Past Surgical History:  Procedure Laterality Date   ABDOMINAL HYSTERECTOMY     CESAREAN SECTION     X 3   COLONOSCOPY N/A 12/26/2020   Procedure: COLONOSCOPY;  Surgeon: Jaynie Collins, DO;  [facility] ENDOSCOPY;  Service: Gastroenterology;  Laterality: N/A;   CYSTOSCOPY N/A 12/15/2019   Procedure: CYSTOSCOPY;  Surgeon: Conard Novak, MD;  [facility]  ORS;  Service: Gynecology;  Laterality: N/A;   ESOPHAGOGASTRODUODENOSCOPY N/A 12/26/2020   Procedure: ESOPHAGOGASTRODUODENOSCOPY (EGD);  Surgeon: Jaynie Collins, DO;  Location: [facility] ENDOSCOPY;  Service: Gastroenterology;  Laterality: N/A;   TOTAL LAPAROSCOPIC HYSTERECTOMY WITH SALPINGECTOMY Bilateral 12/15/2019   Procedure: TOTAL LAPAROSCOPIC HYSTERECTOMY WITH SALPINGECTOMY;  Surgeon: Conard Novak, MD;  Location: [facility] ORS;  Service: Gynecology;  Laterality: Bilateral;   TUBAL LIGATION      Family History  Problem Relation Age of Onset   Asthma Mother    Gout Father    Cancer Maternal  Grandmother 9       unknown type   Uterine cancer Neg Hx    Ovarian cancer Neg Hx    Breast cancer Neg Hx     Social History   Social History Narrative   Not on file     Review of Systems General: Denies fevers, chills, weight loss CV: Denies chest pain, shortness of breath, palpitations Breast: No specific breast complaints other than the pain in her shoulders due to the large size.  Physical Exam    01/17/2023   11:32 AM 12/17/2022    4:29 PM 08/17/2022    1:03 PM  Vitals with BMI  Height 5\' 4"   5\' 4"   Weight 184 lbs  235 lbs  BMI 31.57  40.32  Systolic  128   Diastolic  81   Pulse  73     General:  No acute distress,  Alert and oriented, Non-Toxic, Normal speech and affect Breast: Patient has very large long pendulous breasts with grade 3 ptosis.  There are no dominant masses on physical exam and the nipples are normal in appearance without evidence of nipple discharge.  The sternal notch to nipple distance is 39 cm on the right and 38 cm on the left the fold to nipple distance on the right is 18 cm and 19 cm on the left Mammogram: Patient had a mammogram in October 2023 which was BI-RADS 1.  She is scheduled for repeat mammogram in November of this year. Assessment/Plan Macromastia: Patient has very large breasts and would likely benefit from a bilateral breast reduction.  I believe that I can remove 600 g per breast.  We discussed breast reductions at length and I showed the patient the location of the incisions.  We discussed the unpredictable nature of scarring and wound healing.  Discussed the risks of bleeding, infection, and seroma formation.  The patient understands that I will use drains postoperatively.  We discussed the risk of nipple loss due to nipple ischemia which I believe is a slightly higher risk in this patient due to her long sternal notch to nipple distance.  We discussed the postoperative limitations including no heavy lifting, no vigorous activity, no  submerging the incisions in water for 6 weeks.  She may return to light activity as tolerated.  We discussed the importance of early ambulation specifically immediately after surgery to help decrease the risk of DVT.  She understands that she will need to wear a compressive supportive garment for 6 weeks after surgery.  She understands that no specific breast size can be guaranteed after surgery.  She has requested that I make her as small as possible.  All questions were answered to her satisfaction.  Photographs were obtained today with her consent.  Will submit a surgical request for a bilateral breast reduction at her request.  Sabrina Holder 01/17/2023, 2:30 PM

## 2023-02-06 ENCOUNTER — Ambulatory Visit (INDEPENDENT_AMBULATORY_CARE_PROVIDER_SITE_OTHER): Payer: Medicaid Other | Admitting: Plastic Surgery

## 2023-02-06 ENCOUNTER — Encounter: Payer: Self-pay | Admitting: Plastic Surgery

## 2023-02-06 VITALS — BP 126/85 | HR 76 | Ht 64.0 in | Wt 185.4 lb

## 2023-02-06 DIAGNOSIS — E65 Localized adiposity: Secondary | ICD-10-CM

## 2023-02-06 DIAGNOSIS — M793 Panniculitis, unspecified: Secondary | ICD-10-CM

## 2023-02-06 DIAGNOSIS — R21 Rash and other nonspecific skin eruption: Secondary | ICD-10-CM

## 2023-02-06 NOTE — Progress Notes (Signed)
Sabrina Holder returns today to discuss adding a panniculectomy onto her currently scheduled breast reduction.  She states that she has issues with rashes on the posterior aspect of her pannus and in the intertriginous regions.  She also notes that it the pannus hangs down and makes it difficult to wear her close and occasionally interferes with her daily activities.   On examination she has a large pannus which extends well past her symphysis pubis.  She would likely benefit from a panniculectomy however I have deferred doing this at the time of her breast reduction.  As I explained to her breast reduction will be somewhat challenging due to the significant ptosis and weight of her breast.  I would like to wait and schedule her panniculectomy after the breast reduction is done.  This can be done as soon as 6 to 8 weeks after her surgery.  She understands and is accepting of this.  We discussed panniculectomy at length including the location of the scars and what to expect from the surgery.  She understands that this is not address anything above her umbilicus.  We discussed the risk of bleeding, infection, and seroma formation.  She understands that she will need to wear compressive garment for 6 weeks after the procedure and that she will have 2 drains postoperatively which may be in place for several weeks.  All questions were answered to her satisfaction.  Photographs were obtained today with her consent.  Will submit her for a panniculectomy to be tentatively scheduled for 8 weeks after her breast reduction.  30 minutes were spent reviewing the patient's chart, examining the patient, explaining the procedure, and documenting.

## 2023-02-07 ENCOUNTER — Other Ambulatory Visit: Payer: Self-pay

## 2023-02-13 ENCOUNTER — Other Ambulatory Visit: Payer: Self-pay

## 2023-02-13 NOTE — Progress Notes (Signed)
Specialty Pharmacy Refill Coordination Note  Sabrina Holder is a 42 y.o. female contacted today regarding refills of specialty medication(s) Dupilumab   Patient requested Delivery   Delivery date: 02/21/23   Verified address: [address]   [place]  [address]   Medication will be filled on 02/20/23.

## 2023-02-20 ENCOUNTER — Other Ambulatory Visit (HOSPITAL_COMMUNITY): Payer: Self-pay

## 2023-03-06 ENCOUNTER — Ambulatory Visit: Payer: Medicaid Other | Admitting: Student

## 2023-03-06 ENCOUNTER — Encounter: Payer: Self-pay | Admitting: Student

## 2023-03-06 VITALS — BP 130/80 | HR 84 | Ht 64.0 in | Wt 182.0 lb

## 2023-03-06 DIAGNOSIS — N62 Hypertrophy of breast: Secondary | ICD-10-CM

## 2023-03-06 MED ORDER — OXYCODONE HCL 5 MG PO TABS: 5.0000 mg | ORAL_TABLET | Freq: Four times a day (QID) | ORAL | 0 refills | Status: DC | PRN

## 2023-03-06 MED ORDER — ONDANSETRON HCL 4 MG PO TABS: 4.0000 mg | ORAL_TABLET | Freq: Three times a day (TID) | ORAL | 0 refills | Status: DC | PRN

## 2023-03-06 NOTE — Progress Notes (Signed)
Patient ID: Sabrina Holder, female    DOB: 04-Feb-1981, 42 y.o.   MRN: [id number]  Chief Complaint  Patient presents with   Pre-op Exam      ICD-10-CM   1. Macromastia  N62 MM 3D SCREENING MAMMOGRAM BILATERAL BREAST       History of Present Illness: Sabrina Holder is a 42 y.o.  female  with a history of macromastia.  She presents for preoperative evaluation for upcoming procedure, Bilateral Breast Reduction, scheduled for 03/19/2023 with Dr.  Ladona Ridgel  The patient has not had problems with anesthesia.  Patient states that her last mammogram was October 2023 which was negative.  She states that she has not had a mammogram since.  Patient denies any personal or family history of breast cancer.  She denies any history of cardiac disease.  She denies taking any blood thinners.  Patient reports she is not a smoker.  Patient denies taking any birth control or hormone replacement.  She denies any history of miscarriages.  She denies any personal family history of blood clots or clotting diseases.  She denies any recent surgeries, traumas, infections.  She denies any history of stroke or heart attack.  She denies any history of Crohn's disease or ulcerative colitis.  She denies any history of COPD or asthma.  She denies any history of cancer.  She denies any varicosities to her lower extremities.  She denies any recent fevers, chills or changes in her health.  Patient states that she is currently a 44 DD cup.  She states that she would like to be a B/C cup.  Discussed with patient that cup size cannot be guaranteed.  Summary of Previous Visit: Patient was seen in the clinic by Dr. Ladona Ridgel on 01/17/2023.  At this visit, patient complained of upper back and neck pain for many years due to the large size of her breasts.  Patient reported that she had undergone bariatric surgery and lost 100 pounds.  On exam, her STN on the right was 39 cm and her STN on the left was 38 cm.  Estimated excess  breast tissue to be removed at time of surgery: 600 grams  Job: Works on an Theatre stage manager, planning to take 2 weeks off  PMH Significant for: Asthma, GERD, macromastia   Past Medical History: Allergies: No Known Allergies  Current Medications:  Current Outpatient Medications:    albuterol (PROVENTIL) (2.5 MG/3ML) 0.083% nebulizer solution, Take 3 mLs (2.5 mg total) by nebulization every 6 (six) hours as needed for wheezing or shortness of breath., Disp: 75 mL, Rfl: 1   albuterol (VENTOLIN HFA) 108 (90 Base) MCG/ACT inhaler, Inhale 2 puffs into the lungs every 6 (six) hours as needed for wheezing or shortness of breath., Disp: 8 g, Rfl: 1   arformoterol (BROVANA) 15 MCG/2ML NEBU, Take 2 mLs (15 mcg total) by nebulization 2 (two) times daily., Disp: 120 mL, Rfl: 10   budesonide (PULMICORT) 0.5 MG/2ML nebulizer solution, Take 2 mLs (0.5 mg total) by nebulization 2 (two) times daily., Disp: 1440 mL, Rfl: 0   Dupilumab (DUPIXENT) 300 MG/2ML SOAJ, Inject 300 mg into the skin every 14 (fourteen) days., Disp: 12 mL, Rfl: 0   escitalopram (LEXAPRO) 20 MG tablet, Take 20 mg by mouth daily. , Disp: , Rfl: 1   famotidine (PEPCID) 20 MG tablet, Take 1 tablet (20 mg total) by mouth at bedtime., Disp: 30 tablet, Rfl: 5   fexofenadine (ALLEGRA) 180 MG tablet, Take 180  mg by mouth daily., Disp: , Rfl:    fexofenadine-pseudoephedrine (ALLEGRA-D) 60-120 MG 12 hr tablet, Take 1 tablet by mouth every 12 (twelve) hours., Disp: 30 tablet, Rfl: 0   fluticasone (FLONASE) 50 MCG/ACT nasal spray, Place 2 sprays into both nostrils daily., Disp: 16 g, Rfl: 0   ipratropium (ATROVENT) 0.03 % nasal spray, Place 2 sprays into both nostrils every 12 (twelve) hours., Disp: 30 mL, Rfl: 0   montelukast (SINGULAIR) 10 MG tablet, Take 1 tablet (10 mg total) by mouth daily., Disp: 30 tablet, Rfl: 11   ondansetron (ZOFRAN) 4 MG tablet, Take 1 tablet (4 mg total) by mouth every 8 (eight) hours as needed for up to 20 doses for  nausea or vomiting., Disp: 20 tablet, Rfl: 0   oxyCODONE (ROXICODONE) 5 MG immediate release tablet, Take 1 tablet (5 mg total) by mouth every 6 (six) hours as needed for up to 20 doses for severe pain (pain score 7-10)., Disp: 20 tablet, Rfl: 0   OZEMPIC, 2 MG/DOSE, 8 MG/3ML SOPN, Inject 2 mg into the skin once a week., Disp: , Rfl:    buPROPion (WELLBUTRIN XL) 300 MG 24 hr tablet, Take 300 mg by mouth daily. (Patient not taking: Reported on 03/06/2023), Disp: , Rfl:    metFORMIN (GLUCOPHAGE) 500 MG tablet, Take 1,000 mg by mouth daily with breakfast. (Patient not taking: Reported on 03/06/2023), Disp: , Rfl:    pantoprazole (PROTONIX) 40 MG tablet, Take 1 tablet (40 mg total) by mouth daily. (Patient not taking: Reported on 03/06/2023), Disp: 30 tablet, Rfl: 1  Past Medical Problems: Past Medical History:  Diagnosis Date   Anemia    Anxiety    Asthma    Depression    Environmental allergies    GERD (gastroesophageal reflux disease)    Renal disorder     Past Surgical History: Past Surgical History:  Procedure Laterality Date   ABDOMINAL HYSTERECTOMY     CESAREAN SECTION     X 3   COLONOSCOPY N/A 12/26/2020   Procedure: COLONOSCOPY;  Surgeon: Jaynie Collins, DO;  Location: [facility] ENDOSCOPY;  Service: Gastroenterology;  Laterality: N/A;   CYSTOSCOPY N/A 12/15/2019   Procedure: CYSTOSCOPY;  Surgeon: Conard Novak, MD;  Location: [facility] ORS;  Service: Gynecology;  Laterality: N/A;   ESOPHAGOGASTRODUODENOSCOPY N/A 12/26/2020   Procedure: ESOPHAGOGASTRODUODENOSCOPY (EGD);  Surgeon: Jaynie Collins, DO;  Location: [facility] ENDOSCOPY;  Service: Gastroenterology;  Laterality: N/A;   TOTAL LAPAROSCOPIC HYSTERECTOMY WITH SALPINGECTOMY Bilateral 12/15/2019   Procedure: TOTAL LAPAROSCOPIC HYSTERECTOMY WITH SALPINGECTOMY;  Surgeon: Conard Novak, MD;  Location: [facility] ORS;  Service: Gynecology;  Laterality: Bilateral;   TUBAL LIGATION      Social History: Social History    Socioeconomic History   Marital status: Married    Spouse name: Not on file   Number of children: Not on file   Years of education: Not on file   Highest education level: Not on file  Occupational History   Not on file  Tobacco Use   Smoking status: Former    Current packs/day: 0.00    Average packs/day: 1 pack/day for 20.0 years (20.0 ttl pk-yrs)    Types: Cigarettes    Start date: 11/01/2001    Quit date: 11/01/2021    Years since quitting: 1.3   Smokeless tobacco: Never   Tobacco comments:    Quit in 2022 and recently started back.  Stopped again a week and a half ago on wellbutrin.  11/13/2021 hfb  Vaping Use  Vaping status: Never Used  Substance and Sexual Activity   Alcohol use: Yes    Comment: occasional (once weekly)   Drug use: No   Sexual activity: Not Currently    Birth control/protection: Surgical    Comment: Tubal ligation  Other Topics Concern   Not on file  Social History Narrative   Not on file   Social Determinants of Health   Financial Resource Strain: Not on file  Food Insecurity: Not on file  Transportation Needs: Not on file  Physical Activity: Not on file  Stress: Not on file  Social Connections: Not on file  Intimate Partner Violence: Not on file    Family History: Family History  Problem Relation Age of Onset   Asthma Mother    Gout Father    Cancer Maternal Grandmother 60       unknown type   Uterine cancer Neg Hx    Ovarian cancer Neg Hx    Breast cancer Neg Hx     Review of Systems: Denies any fevers, chills or recent changes in her health  Physical Exam: Vital Signs BP 130/80 (BP Location: Left Arm, Patient Position: Sitting, Cuff Size: Large)   Pulse 84   Ht 5\' 4"  (1.626 m)   Wt 182 lb (82.6 kg)   LMP 10/20/2019 (Approximate)   SpO2 97%   BMI 31.24 kg/m   Physical Exam  Constitutional:      General: Not in acute distress.    Appearance: Normal appearance. Not ill-appearing.  HENT:     Head: Normocephalic and  atraumatic.  Neck:     Musculoskeletal: Normal range of motion.  Cardiovascular:     Rate and Rhythm: Normal rate Pulmonary:     Effort: Pulmonary effort is normal. No respiratory distress.  Musculoskeletal: Normal range of motion.  Skin:    General: Skin is warm and dry.     Findings: No erythema or rash.  Neurological:     Mental Status: Alert and oriented to person, place, and time. Mental status is at baseline.  Psychiatric:        Mood and Affect: Mood normal.        Behavior: Behavior normal.    Assessment/Plan: The patient is scheduled for bilateral breast reduction with Dr. Ladona Ridgel.  Risks, benefits, and alternatives of procedure discussed, questions answered and consent obtained.    Smoking Status: Non-smoker; Counseling Given?  N/A Last Mammogram: 01/03/2022, negative; discussed with patient that she will need an updated mammogram prior to surgery given that she has not had a mammogram in over a year.  Placed order today.  Patient understands that she needs to have this done prior to surgery.  Caprini Score: 4; Risk Factors include: Age, BMI > 25, and length of planned surgery. Recommendation for mechanical prophylaxis. Encourage early ambulation.   Pictures obtained: @consult   Post-op Rx sent to pharmacy: Oxycodone, Zofran  Discussed with patient to hold her vitamins and supplements at least 1 week prior to surgery.  Discussed with her to hold her Ozempic the dose before surgery.  Patient states that she is no longer taking metformin.  Discussed with patient that her Dupilumab may cause wound healing delays and complications.  She states that she takes this for her breathing.  She states that she would rather not hold it and understands that this medication may come with an associated risk of wound healing issues.  Patient was provided with the breast reduction and General Surgical Risk consent document and Pain  Medication Agreement prior to their appointment.  They had  adequate time to read through the risk consent documents and Pain Medication Agreement. We also discussed them in person together during this preop appointment. All of their questions were answered to their satisfaction.  Recommended calling if they have any further questions.  Risk consent form and Pain Medication Agreement to be scanned into patient's chart.  The risk that can be encountered with breast reduction were discussed and include the following but not limited to these:  Breast asymmetry, fluid accumulation, firmness of the breast, inability to breast feed, loss of nipple or areola, skin loss, decrease or no nipple sensation, fat necrosis of the breast tissue, bleeding, infection, healing delay.  There are risks of anesthesia, changes to skin sensation and injury to nerves or blood vessels.  The muscle can be temporarily or permanently injured.  You may have an allergic reaction to tape, suture, glue, blood products which can result in skin discoloration, swelling, pain, skin lesions, poor healing.  Any of these can lead to the need for revisonal surgery or stage procedures.  A reduction has potential to interfere with diagnostic procedures.  Nipple or breast piercing can increase risks of infection.  This procedure is best done when the breast is fully developed.  Changes in the breast will continue to occur over time.  Pregnancy can alter the outcomes of previous breast reduction surgery, weight gain and weigh loss can also effect the long term appearance.     Electronically signed by: Laurena Spies, PA-C 03/06/2023 10:56 AM

## 2023-03-06 NOTE — H&P (View-Only) (Signed)
 Patient ID: Sabrina Holder, female    DOB: 04-Feb-1981, 42 y.o.   MRN: [id number]  Chief Complaint  Patient presents with   Pre-op Exam      ICD-10-CM   1. Macromastia  N62 MM 3D SCREENING MAMMOGRAM BILATERAL BREAST       History of Present Illness: Sabrina Holder is a 42 y.o.  female  with a history of macromastia.  She presents for preoperative evaluation for upcoming procedure, Bilateral Breast Reduction, scheduled for 03/19/2023 with Dr.  Ladona Ridgel  The patient has not had problems with anesthesia.  Patient states that her last mammogram was October 2023 which was negative.  She states that she has not had a mammogram since.  Patient denies any personal or family history of breast cancer.  She denies any history of cardiac disease.  She denies taking any blood thinners.  Patient reports she is not a smoker.  Patient denies taking any birth control or hormone replacement.  She denies any history of miscarriages.  She denies any personal family history of blood clots or clotting diseases.  She denies any recent surgeries, traumas, infections.  She denies any history of stroke or heart attack.  She denies any history of Crohn's disease or ulcerative colitis.  She denies any history of COPD or asthma.  She denies any history of cancer.  She denies any varicosities to her lower extremities.  She denies any recent fevers, chills or changes in her health.  Patient states that she is currently a 44 DD cup.  She states that she would like to be a B/C cup.  Discussed with patient that cup size cannot be guaranteed.  Summary of Previous Visit: Patient was seen in the clinic by Dr. Ladona Ridgel on 01/17/2023.  At this visit, patient complained of upper back and neck pain for many years due to the large size of her breasts.  Patient reported that she had undergone bariatric surgery and lost 100 pounds.  On exam, her STN on the right was 39 cm and her STN on the left was 38 cm.  Estimated excess  breast tissue to be removed at time of surgery: 600 grams  Job: Works on an Theatre stage manager, planning to take 2 weeks off  PMH Significant for: Asthma, GERD, macromastia   Past Medical History: Allergies: No Known Allergies  Current Medications:  Current Outpatient Medications:    albuterol (PROVENTIL) (2.5 MG/3ML) 0.083% nebulizer solution, Take 3 mLs (2.5 mg total) by nebulization every 6 (six) hours as needed for wheezing or shortness of breath., Disp: 75 mL, Rfl: 1   albuterol (VENTOLIN HFA) 108 (90 Base) MCG/ACT inhaler, Inhale 2 puffs into the lungs every 6 (six) hours as needed for wheezing or shortness of breath., Disp: 8 g, Rfl: 1   arformoterol (BROVANA) 15 MCG/2ML NEBU, Take 2 mLs (15 mcg total) by nebulization 2 (two) times daily., Disp: 120 mL, Rfl: 10   budesonide (PULMICORT) 0.5 MG/2ML nebulizer solution, Take 2 mLs (0.5 mg total) by nebulization 2 (two) times daily., Disp: 1440 mL, Rfl: 0   Dupilumab (DUPIXENT) 300 MG/2ML SOAJ, Inject 300 mg into the skin every 14 (fourteen) days., Disp: 12 mL, Rfl: 0   escitalopram (LEXAPRO) 20 MG tablet, Take 20 mg by mouth daily. , Disp: , Rfl: 1   famotidine (PEPCID) 20 MG tablet, Take 1 tablet (20 mg total) by mouth at bedtime., Disp: 30 tablet, Rfl: 5   fexofenadine (ALLEGRA) 180 MG tablet, Take 180  mg by mouth daily., Disp: , Rfl:    fexofenadine-pseudoephedrine (ALLEGRA-D) 60-120 MG 12 hr tablet, Take 1 tablet by mouth every 12 (twelve) hours., Disp: 30 tablet, Rfl: 0   fluticasone (FLONASE) 50 MCG/ACT nasal spray, Place 2 sprays into both nostrils daily., Disp: 16 g, Rfl: 0   ipratropium (ATROVENT) 0.03 % nasal spray, Place 2 sprays into both nostrils every 12 (twelve) hours., Disp: 30 mL, Rfl: 0   montelukast (SINGULAIR) 10 MG tablet, Take 1 tablet (10 mg total) by mouth daily., Disp: 30 tablet, Rfl: 11   ondansetron (ZOFRAN) 4 MG tablet, Take 1 tablet (4 mg total) by mouth every 8 (eight) hours as needed for up to 20 doses for  nausea or vomiting., Disp: 20 tablet, Rfl: 0   oxyCODONE (ROXICODONE) 5 MG immediate release tablet, Take 1 tablet (5 mg total) by mouth every 6 (six) hours as needed for up to 20 doses for severe pain (pain score 7-10)., Disp: 20 tablet, Rfl: 0   OZEMPIC, 2 MG/DOSE, 8 MG/3ML SOPN, Inject 2 mg into the skin once a week., Disp: , Rfl:    buPROPion (WELLBUTRIN XL) 300 MG 24 hr tablet, Take 300 mg by mouth daily. (Patient not taking: Reported on 03/06/2023), Disp: , Rfl:    metFORMIN (GLUCOPHAGE) 500 MG tablet, Take 1,000 mg by mouth daily with breakfast. (Patient not taking: Reported on 03/06/2023), Disp: , Rfl:    pantoprazole (PROTONIX) 40 MG tablet, Take 1 tablet (40 mg total) by mouth daily. (Patient not taking: Reported on 03/06/2023), Disp: 30 tablet, Rfl: 1  Past Medical Problems: Past Medical History:  Diagnosis Date   Anemia    Anxiety    Asthma    Depression    Environmental allergies    GERD (gastroesophageal reflux disease)    Renal disorder     Past Surgical History: Past Surgical History:  Procedure Laterality Date   ABDOMINAL HYSTERECTOMY     CESAREAN SECTION     X 3   COLONOSCOPY N/A 12/26/2020   Procedure: COLONOSCOPY;  Surgeon: Jaynie Collins, DO;  Location: [facility] ENDOSCOPY;  Service: Gastroenterology;  Laterality: N/A;   CYSTOSCOPY N/A 12/15/2019   Procedure: CYSTOSCOPY;  Surgeon: Conard Novak, MD;  Location: [facility] ORS;  Service: Gynecology;  Laterality: N/A;   ESOPHAGOGASTRODUODENOSCOPY N/A 12/26/2020   Procedure: ESOPHAGOGASTRODUODENOSCOPY (EGD);  Surgeon: Jaynie Collins, DO;  Location: [facility] ENDOSCOPY;  Service: Gastroenterology;  Laterality: N/A;   TOTAL LAPAROSCOPIC HYSTERECTOMY WITH SALPINGECTOMY Bilateral 12/15/2019   Procedure: TOTAL LAPAROSCOPIC HYSTERECTOMY WITH SALPINGECTOMY;  Surgeon: Conard Novak, MD;  Location: [facility] ORS;  Service: Gynecology;  Laterality: Bilateral;   TUBAL LIGATION      Social History: Social History    Socioeconomic History   Marital status: Married    Spouse name: Not on file   Number of children: Not on file   Years of education: Not on file   Highest education level: Not on file  Occupational History   Not on file  Tobacco Use   Smoking status: Former    Current packs/day: 0.00    Average packs/day: 1 pack/day for 20.0 years (20.0 ttl pk-yrs)    Types: Cigarettes    Start date: 11/01/2001    Quit date: 11/01/2021    Years since quitting: 1.3   Smokeless tobacco: Never   Tobacco comments:    Quit in 2022 and recently started back.  Stopped again a week and a half ago on wellbutrin.  11/13/2021 hfb  Vaping Use  Vaping status: Never Used  Substance and Sexual Activity   Alcohol use: Yes    Comment: occasional (once weekly)   Drug use: No   Sexual activity: Not Currently    Birth control/protection: Surgical    Comment: Tubal ligation  Other Topics Concern   Not on file  Social History Narrative   Not on file   Social Determinants of Health   Financial Resource Strain: Not on file  Food Insecurity: Not on file  Transportation Needs: Not on file  Physical Activity: Not on file  Stress: Not on file  Social Connections: Not on file  Intimate Partner Violence: Not on file    Family History: Family History  Problem Relation Age of Onset   Asthma Mother    Gout Father    Cancer Maternal Grandmother 60       unknown type   Uterine cancer Neg Hx    Ovarian cancer Neg Hx    Breast cancer Neg Hx     Review of Systems: Denies any fevers, chills or recent changes in her health  Physical Exam: Vital Signs BP 130/80 (BP Location: Left Arm, Patient Position: Sitting, Cuff Size: Large)   Pulse 84   Ht 5\' 4"  (1.626 m)   Wt 182 lb (82.6 kg)   LMP 10/20/2019 (Approximate)   SpO2 97%   BMI 31.24 kg/m   Physical Exam  Constitutional:      General: Not in acute distress.    Appearance: Normal appearance. Not ill-appearing.  HENT:     Head: Normocephalic and  atraumatic.  Neck:     Musculoskeletal: Normal range of motion.  Cardiovascular:     Rate and Rhythm: Normal rate Pulmonary:     Effort: Pulmonary effort is normal. No respiratory distress.  Musculoskeletal: Normal range of motion.  Skin:    General: Skin is warm and dry.     Findings: No erythema or rash.  Neurological:     Mental Status: Alert and oriented to person, place, and time. Mental status is at baseline.  Psychiatric:        Mood and Affect: Mood normal.        Behavior: Behavior normal.    Assessment/Plan: The patient is scheduled for bilateral breast reduction with Dr. Ladona Ridgel.  Risks, benefits, and alternatives of procedure discussed, questions answered and consent obtained.    Smoking Status: Non-smoker; Counseling Given?  N/A Last Mammogram: 01/03/2022, negative; discussed with patient that she will need an updated mammogram prior to surgery given that she has not had a mammogram in over a year.  Placed order today.  Patient understands that she needs to have this done prior to surgery.  Caprini Score: 4; Risk Factors include: Age, BMI > 25, and length of planned surgery. Recommendation for mechanical prophylaxis. Encourage early ambulation.   Pictures obtained: @consult   Post-op Rx sent to pharmacy: Oxycodone, Zofran  Discussed with patient to hold her vitamins and supplements at least 1 week prior to surgery.  Discussed with her to hold her Ozempic the dose before surgery.  Patient states that she is no longer taking metformin.  Discussed with patient that her Dupilumab may cause wound healing delays and complications.  She states that she takes this for her breathing.  She states that she would rather not hold it and understands that this medication may come with an associated risk of wound healing issues.  Patient was provided with the breast reduction and General Surgical Risk consent document and Pain  Medication Agreement prior to their appointment.  They had  adequate time to read through the risk consent documents and Pain Medication Agreement. We also discussed them in person together during this preop appointment. All of their questions were answered to their satisfaction.  Recommended calling if they have any further questions.  Risk consent form and Pain Medication Agreement to be scanned into patient's chart.  The risk that can be encountered with breast reduction were discussed and include the following but not limited to these:  Breast asymmetry, fluid accumulation, firmness of the breast, inability to breast feed, loss of nipple or areola, skin loss, decrease or no nipple sensation, fat necrosis of the breast tissue, bleeding, infection, healing delay.  There are risks of anesthesia, changes to skin sensation and injury to nerves or blood vessels.  The muscle can be temporarily or permanently injured.  You may have an allergic reaction to tape, suture, glue, blood products which can result in skin discoloration, swelling, pain, skin lesions, poor healing.  Any of these can lead to the need for revisonal surgery or stage procedures.  A reduction has potential to interfere with diagnostic procedures.  Nipple or breast piercing can increase risks of infection.  This procedure is best done when the breast is fully developed.  Changes in the breast will continue to occur over time.  Pregnancy can alter the outcomes of previous breast reduction surgery, weight gain and weigh loss can also effect the long term appearance.     Electronically signed by: Laurena Spies, PA-C 03/06/2023 10:56 AM

## 2023-03-11 ENCOUNTER — Other Ambulatory Visit: Payer: Self-pay

## 2023-03-11 ENCOUNTER — Encounter (HOSPITAL_BASED_OUTPATIENT_CLINIC_OR_DEPARTMENT_OTHER): Payer: Self-pay | Admitting: Plastic Surgery

## 2023-03-13 ENCOUNTER — Ambulatory Visit
Admission: RE | Admit: 2023-03-13 | Discharge: 2023-03-13 | Disposition: A | Discharge status: Home / Self Care | Payer: Medicaid Other | Source: Ambulatory Visit | Attending: Student | Admitting: Student

## 2023-03-13 DIAGNOSIS — N62 Hypertrophy of breast: Secondary | ICD-10-CM

## 2023-03-18 ENCOUNTER — Other Ambulatory Visit: Payer: Self-pay

## 2023-03-18 ENCOUNTER — Other Ambulatory Visit: Payer: Self-pay | Admitting: Internal Medicine

## 2023-03-18 ENCOUNTER — Other Ambulatory Visit (HOSPITAL_COMMUNITY): Payer: Self-pay

## 2023-03-18 DIAGNOSIS — J4550 Severe persistent asthma, uncomplicated: Secondary | ICD-10-CM

## 2023-03-18 MED ORDER — DUPIXENT 300 MG/2ML ~~LOC~~ SOAJ
300.0000 mg | SUBCUTANEOUS | 0 refills | Status: DC
  Filled 2023-03-18: qty 4, 28d supply, fill #0
  Filled 2023-04-09: qty 4, 28d supply, fill #1
  Filled 2023-05-02: qty 4, 28d supply, fill #2

## 2023-03-18 NOTE — Anesthesia Preprocedure Evaluation (Addendum)
Anesthesia Evaluation  Patient identified by MRN, date of birth, ID band Patient awake    Reviewed: Allergy & Precautions, NPO status , Patient's Chart, lab work & pertinent test results  History of Anesthesia Complications Negative for: history of anesthetic complications  Airway Mallampati: II  TM Distance: >3 FB Neck ROM: Full    Dental no notable dental hx.    Pulmonary asthma , Patient abstained from smoking., former smoker   Pulmonary exam normal        Cardiovascular negative cardio ROS Normal cardiovascular exam     Neuro/Psych   Anxiety Depression    negative neurological ROS     GI/Hepatic Neg liver ROS,GERD  Medicated,,  Endo/Other  On Ozempic  Renal/GU negative Renal ROS  negative genitourinary   Musculoskeletal negative musculoskeletal ROS (+)    Abdominal   Peds  Hematology negative hematology ROS (+)   Anesthesia Other Findings Macromastia  Reproductive/Obstetrics negative OB ROS                              Anesthesia Physical Anesthesia Plan  ASA: 2  Anesthesia Plan: General   Post-op Pain Management: Tylenol PO (pre-op)*   Induction: Intravenous  PONV Risk Score and Plan: 3 and Treatment may vary due to age or medical condition, Ondansetron, Dexamethasone, Midazolam and Scopolamine patch - Pre-op  Airway Management Planned: Oral ETT  Additional Equipment: None  Intra-op Plan:   Post-operative Plan: Extubation in OR  Informed Consent: I have reviewed the patients History and Physical, chart, labs and discussed the procedure including the risks, benefits and alternatives for the proposed anesthesia with the patient or authorized representative who has indicated his/her understanding and acceptance.     Dental advisory given  Plan Discussed with: CRNA  Anesthesia Plan Comments:         Anesthesia Quick Evaluation

## 2023-03-18 NOTE — Progress Notes (Signed)
Specialty Pharmacy Refill Coordination Note  Branden Ferrar is a 42 y.o. female contacted today regarding refills of specialty medication(s) Dupilumab (Dupixent)   Patient requested Delivery   Delivery date: 03/21/23   Verified address: [address] [place] [address]   Medication will be filled on 03/20/23.   Pending refill request-- call if any delays

## 2023-03-19 ENCOUNTER — Other Ambulatory Visit: Payer: Self-pay

## 2023-03-19 ENCOUNTER — Ambulatory Visit (HOSPITAL_BASED_OUTPATIENT_CLINIC_OR_DEPARTMENT_OTHER): Payer: Self-pay | Admitting: Anesthesiology

## 2023-03-19 ENCOUNTER — Encounter (HOSPITAL_BASED_OUTPATIENT_CLINIC_OR_DEPARTMENT_OTHER)
Admission: RE | Disposition: A | Discharge status: Home / Self Care | Payer: Self-pay | Source: Home / Self Care | Attending: Plastic Surgery

## 2023-03-19 ENCOUNTER — Ambulatory Visit (HOSPITAL_BASED_OUTPATIENT_CLINIC_OR_DEPARTMENT_OTHER)
Admission: RE | Admit: 2023-03-19 | Discharge: 2023-03-19 | Disposition: A | Discharge status: Home / Self Care | Payer: Medicaid Other | Attending: Plastic Surgery | Admitting: Plastic Surgery

## 2023-03-19 ENCOUNTER — Encounter (HOSPITAL_BASED_OUTPATIENT_CLINIC_OR_DEPARTMENT_OTHER): Payer: Self-pay | Admitting: Plastic Surgery

## 2023-03-19 DIAGNOSIS — Z7951 Long term (current) use of inhaled steroids: Secondary | ICD-10-CM | POA: Diagnosis not present

## 2023-03-19 DIAGNOSIS — N6001 Solitary cyst of right breast: Secondary | ICD-10-CM | POA: Insufficient documentation

## 2023-03-19 DIAGNOSIS — Z87891 Personal history of nicotine dependence: Secondary | ICD-10-CM | POA: Diagnosis not present

## 2023-03-19 DIAGNOSIS — M549 Dorsalgia, unspecified: Secondary | ICD-10-CM | POA: Insufficient documentation

## 2023-03-19 DIAGNOSIS — Z79899 Other long term (current) drug therapy: Secondary | ICD-10-CM | POA: Diagnosis not present

## 2023-03-19 DIAGNOSIS — M542 Cervicalgia: Secondary | ICD-10-CM | POA: Diagnosis not present

## 2023-03-19 DIAGNOSIS — F32A Depression, unspecified: Secondary | ICD-10-CM | POA: Insufficient documentation

## 2023-03-19 DIAGNOSIS — N6021 Fibroadenosis of right breast: Secondary | ICD-10-CM | POA: Diagnosis not present

## 2023-03-19 DIAGNOSIS — N62 Hypertrophy of breast: Secondary | ICD-10-CM

## 2023-03-19 DIAGNOSIS — J45909 Unspecified asthma, uncomplicated: Secondary | ICD-10-CM | POA: Diagnosis not present

## 2023-03-19 DIAGNOSIS — N6022 Fibroadenosis of left breast: Secondary | ICD-10-CM | POA: Diagnosis not present

## 2023-03-19 DIAGNOSIS — Z7984 Long term (current) use of oral hypoglycemic drugs: Secondary | ICD-10-CM | POA: Insufficient documentation

## 2023-03-19 DIAGNOSIS — F419 Anxiety disorder, unspecified: Secondary | ICD-10-CM | POA: Insufficient documentation

## 2023-03-19 DIAGNOSIS — K219 Gastro-esophageal reflux disease without esophagitis: Secondary | ICD-10-CM | POA: Diagnosis not present

## 2023-03-19 DIAGNOSIS — Z01818 Encounter for other preprocedural examination: Secondary | ICD-10-CM

## 2023-03-19 DIAGNOSIS — N6002 Solitary cyst of left breast: Secondary | ICD-10-CM | POA: Insufficient documentation

## 2023-03-19 HISTORY — PX: BREAST REDUCTION SURGERY: SHX8

## 2023-03-19 SURGERY — MAMMOPLASTY, REDUCTION
Anesthesia: General | Site: Breast | Laterality: Bilateral

## 2023-03-19 MED ORDER — CHLORHEXIDINE GLUCONATE CLOTH 2 % EX PADS: 6.0000 | MEDICATED_PAD | Freq: Once | CUTANEOUS | Status: DC

## 2023-03-19 MED ORDER — ONDANSETRON HCL 4 MG/2ML IJ SOLN
INTRAMUSCULAR | Status: DC | PRN
  Administered 2023-03-19: 4 mg via INTRAVENOUS

## 2023-03-19 MED ORDER — OXYCODONE HCL 5 MG PO TABS
5.0000 mg | ORAL_TABLET | Freq: Once | ORAL | Status: AC
  Administered 2023-03-19: 5 mg via ORAL

## 2023-03-19 MED ORDER — PHENYLEPHRINE HCL (PRESSORS) 10 MG/ML IV SOLN
INTRAVENOUS | Status: DC | PRN
  Administered 2023-03-19 (×2): 80 ug via INTRAVENOUS
  Administered 2023-03-19: 200 ug via INTRAVENOUS
  Administered 2023-03-19: 80 ug via INTRAVENOUS
  Administered 2023-03-19: 100 ug via INTRAVENOUS
  Administered 2023-03-19: 50 ug via INTRAVENOUS

## 2023-03-19 MED ORDER — ACETAMINOPHEN 10 MG/ML IV SOLN
1000.0000 mg | Freq: Four times a day (QID) | INTRAVENOUS | Status: DC
  Administered 2023-03-19: 1000 mg via INTRAVENOUS

## 2023-03-19 MED ORDER — DEXAMETHASONE SODIUM PHOSPHATE 4 MG/ML IJ SOLN
INTRAMUSCULAR | Status: DC | PRN
  Administered 2023-03-19: 4 mg via INTRAVENOUS

## 2023-03-19 MED ORDER — MIDAZOLAM HCL 2 MG/2ML IJ SOLN
INTRAMUSCULAR | Status: AC
Start: 2023-03-19 — End: ?
  Filled 2023-03-19: qty 2

## 2023-03-19 MED ORDER — ACETAMINOPHEN 10 MG/ML IV SOLN
INTRAVENOUS | Status: AC
  Filled 2023-03-19: qty 100

## 2023-03-19 MED ORDER — SODIUM CHLORIDE (PF) 0.9 % IJ SOLN
INTRAMUSCULAR | Status: DC | PRN
  Administered 2023-03-19 (×2): 50 mL via INTRAMUSCULAR

## 2023-03-19 MED ORDER — ACETAMINOPHEN 500 MG PO TABS
1000.0000 mg | ORAL_TABLET | Freq: Once | ORAL | Status: AC
  Administered 2023-03-19: 1000 mg via ORAL

## 2023-03-19 MED ORDER — HYDROMORPHONE HCL 1 MG/ML IJ SOLN
INTRAMUSCULAR | Status: AC
  Filled 2023-03-19: qty 0.5

## 2023-03-19 MED ORDER — LACTATED RINGERS IV SOLN
INTRAVENOUS | Status: DC
Start: 2023-03-19 — End: 2023-03-19

## 2023-03-19 MED ORDER — FENTANYL CITRATE (PF) 100 MCG/2ML IJ SOLN
INTRAMUSCULAR | Status: AC
  Filled 2023-03-19: qty 2

## 2023-03-19 MED ORDER — FENTANYL CITRATE (PF) 100 MCG/2ML IJ SOLN
INTRAMUSCULAR | Status: DC | PRN
  Administered 2023-03-19: 100 ug via INTRAVENOUS

## 2023-03-19 MED ORDER — SCOPOLAMINE 1 MG/3DAYS TD PT72
1.0000 | MEDICATED_PATCH | Freq: Once | TRANSDERMAL | Status: DC
  Administered 2023-03-19: 1.5 mg via TRANSDERMAL

## 2023-03-19 MED ORDER — ROCURONIUM BROMIDE 100 MG/10ML IV SOLN
INTRAVENOUS | Status: DC | PRN
  Administered 2023-03-19: 60 mg via INTRAVENOUS
  Administered 2023-03-19: 10 mg via INTRAVENOUS

## 2023-03-19 MED ORDER — MIDAZOLAM HCL 5 MG/5ML IJ SOLN
INTRAMUSCULAR | Status: DC | PRN
  Administered 2023-03-19: 2 mg via INTRAVENOUS

## 2023-03-19 MED ORDER — SODIUM CHLORIDE 0.9 % IV SOLN: INTRAVENOUS | Status: DC | PRN

## 2023-03-19 MED ORDER — ACETAMINOPHEN 500 MG PO TABS
ORAL_TABLET | ORAL | Status: AC
  Filled 2023-03-19: qty 2

## 2023-03-19 MED ORDER — EPHEDRINE SULFATE (PRESSORS) 50 MG/ML IJ SOLN
INTRAMUSCULAR | Status: DC | PRN
  Administered 2023-03-19 (×2): 10 mg via INTRAVENOUS
  Administered 2023-03-19: 5 mg via INTRAVENOUS

## 2023-03-19 MED ORDER — CEFAZOLIN SODIUM-DEXTROSE 2-4 GM/100ML-% IV SOLN
INTRAVENOUS | Status: AC
  Filled 2023-03-19: qty 100

## 2023-03-19 MED ORDER — DROPERIDOL 2.5 MG/ML IJ SOLN: 0.6250 mg | Freq: Once | INTRAMUSCULAR | Status: DC | PRN

## 2023-03-19 MED ORDER — SCOPOLAMINE 1 MG/3DAYS TD PT72
MEDICATED_PATCH | TRANSDERMAL | Status: AC
  Filled 2023-03-19: qty 1

## 2023-03-19 MED ORDER — LIDOCAINE HCL (CARDIAC) PF 100 MG/5ML IV SOSY
PREFILLED_SYRINGE | INTRAVENOUS | Status: DC | PRN
  Administered 2023-03-19: 100 mg via INTRAVENOUS

## 2023-03-19 MED ORDER — 0.9 % SODIUM CHLORIDE (POUR BTL) OPTIME
TOPICAL | Status: DC | PRN
  Administered 2023-03-19 (×2): 1000 mL

## 2023-03-19 MED ORDER — SUGAMMADEX SODIUM 200 MG/2ML IV SOLN
INTRAVENOUS | Status: DC | PRN
  Administered 2023-03-19: 200 mg via INTRAVENOUS

## 2023-03-19 MED ORDER — PROPOFOL 10 MG/ML IV BOLUS
INTRAVENOUS | Status: DC | PRN
  Administered 2023-03-19: 200 mg via INTRAVENOUS

## 2023-03-19 MED ORDER — OXYCODONE HCL 5 MG PO TABS
ORAL_TABLET | ORAL | Status: AC
  Filled 2023-03-19: qty 1

## 2023-03-19 MED ORDER — HYDROMORPHONE HCL 1 MG/ML IJ SOLN
0.2500 mg | INTRAMUSCULAR | Status: DC | PRN
  Administered 2023-03-19: 0.5 mg via INTRAVENOUS

## 2023-03-19 MED ORDER — HYDROMORPHONE HCL 1 MG/ML IJ SOLN
INTRAMUSCULAR | Status: DC | PRN
  Administered 2023-03-19: .5 mg via INTRAVENOUS

## 2023-03-19 MED ORDER — CEFAZOLIN SODIUM-DEXTROSE 2-4 GM/100ML-% IV SOLN
2.0000 g | INTRAVENOUS | Status: AC
  Administered 2023-03-19: 2 g via INTRAVENOUS

## 2023-03-19 MED ORDER — BUPIVACAINE LIPOSOME 1.3 % IJ SUSP
INTRAMUSCULAR | Status: AC
Start: 2023-03-19 — End: ?
  Filled 2023-03-19: qty 20

## 2023-03-19 SURGICAL SUPPLY — 54 items
BINDER BREAST LRG (GAUZE/BANDAGES/DRESSINGS) IMPLANT
BINDER BREAST MEDIUM (GAUZE/BANDAGES/DRESSINGS) IMPLANT
BINDER BREAST XLRG (GAUZE/BANDAGES/DRESSINGS) IMPLANT
BINDER BREAST XXLRG (GAUZE/BANDAGES/DRESSINGS) IMPLANT
BIOPATCH RED 1 DISK 7.0 (GAUZE/BANDAGES/DRESSINGS) ×2 IMPLANT
BLADE SURG 10 STRL SS (BLADE) ×6 IMPLANT
BLADE SURG 15 STRL LF DISP TIS (BLADE) ×1 IMPLANT
CANISTER SUCT 1200ML W/VALVE (MISCELLANEOUS) ×1 IMPLANT
DERMABOND ADVANCED .7 DNX12 (GAUZE/BANDAGES/DRESSINGS) ×2 IMPLANT
DRAIN CHANNEL 19F RND (DRAIN) ×2 IMPLANT
DRAPE IMP U-DRAPE 54X76 (DRAPES) IMPLANT
DRAPE UTILITY XL STRL (DRAPES) ×1 IMPLANT
DRSG TEGADERM 4X4.75 (GAUZE/BANDAGES/DRESSINGS) ×2 IMPLANT
ELECT BLADE 4.0 EZ CLEAN MEGAD (MISCELLANEOUS) ×1
ELECT REM PT RETURN 9FT ADLT (ELECTROSURGICAL) ×2
ELECTRODE BLDE 4.0 EZ CLN MEGD (MISCELLANEOUS) ×1 IMPLANT
ELECTRODE REM PT RTRN 9FT ADLT (ELECTROSURGICAL) ×2 IMPLANT
EVACUATOR SILICONE 100CC (DRAIN) ×2 IMPLANT
GAUZE PAD ABD 8X10 STRL (GAUZE/BANDAGES/DRESSINGS) ×4 IMPLANT
GAUZE SPONGE 2X2 STRL 8-PLY (GAUZE/BANDAGES/DRESSINGS) ×2 IMPLANT
GLOVE BIO SURGEON STRL SZ 6.5 (GLOVE) IMPLANT
GLOVE BIO SURGEON STRL SZ7.5 (GLOVE) IMPLANT
GLOVE BIO SURGEON STRL SZ8 (GLOVE) ×1 IMPLANT
GLOVE BIOGEL PI IND STRL 7.0 (GLOVE) IMPLANT
GLOVE BIOGEL PI IND STRL 8 (GLOVE) ×1 IMPLANT
GOWN STRL REUS W/ TWL LRG LVL3 (GOWN DISPOSABLE) ×1 IMPLANT
GOWN STRL REUS W/TWL XL LVL3 (GOWN DISPOSABLE) ×1 IMPLANT
HEMOSTAT ARISTA ABSORB 3G PWDR (HEMOSTASIS) IMPLANT
HIBICLENS CHG 4% 4OZ BTL (MISCELLANEOUS) ×1 IMPLANT
MARKER SKIN DUAL TIP RULER LAB (MISCELLANEOUS) ×1 IMPLANT
NDL HYPO 22X1.5 SAFETY MO (MISCELLANEOUS) ×2 IMPLANT
NEEDLE HYPO 22X1.5 SAFETY MO (MISCELLANEOUS) ×2
NS IRRIG 1000ML POUR BTL (IV SOLUTION) ×1 IMPLANT
PACK BASIN DAY SURGERY FS (CUSTOM PROCEDURE TRAY) ×1 IMPLANT
PACK UNIVERSAL I (CUSTOM PROCEDURE TRAY) ×1 IMPLANT
PENCIL SMOKE EVACUATOR (MISCELLANEOUS) ×2 IMPLANT
PIN SAFETY STERILE (MISCELLANEOUS) ×1 IMPLANT
SLEEVE SCD COMPRESS KNEE MED (STOCKING) ×1 IMPLANT
SPONGE T-LAP 18X18 ~~LOC~~+RFID (SPONGE) ×3 IMPLANT
STAPLER SKIN PROX WIDE 3.9 (STAPLE) ×1 IMPLANT
SUT MNCRL AB 3-0 PS2 27 (SUTURE) ×4 IMPLANT
SUT MNCRL AB 4-0 PS2 18 (SUTURE) ×4 IMPLANT
SUT MON AB 2-0 CT1 36 (SUTURE) ×1 IMPLANT
SUT MON AB 5-0 PS2 18 (SUTURE) IMPLANT
SUT SILK 2 0 SH (SUTURE) ×2 IMPLANT
SUT VIC AB 3-0 SH 27X BRD (SUTURE) IMPLANT
SYR 20ML LL LF (SYRINGE) ×2 IMPLANT
SYR BULB IRRIG 60ML STRL (SYRINGE) ×1 IMPLANT
SYR CONTROL 10ML LL (SYRINGE) ×1 IMPLANT
TOWEL GREEN STERILE FF (TOWEL DISPOSABLE) ×2 IMPLANT
TRAY DSU PREP LF (CUSTOM PROCEDURE TRAY) ×1 IMPLANT
TUBE CONNECTING 20X1/4 (TUBING) ×1 IMPLANT
UNDERPAD 30X36 HEAVY ABSORB (UNDERPADS AND DIAPERS) ×2 IMPLANT
YANKAUER SUCT BULB TIP NO VENT (SUCTIONS) ×1 IMPLANT

## 2023-03-19 NOTE — Discharge Instructions (Addendum)
Information for Discharge Teaching: EXPAREL (bupivacaine liposome injectable suspension)   Pain relief is important to your recovery. The goal is to control your pain so you can move easier and return to your normal activities as soon as possible after your procedure. Your physician may use several types of medicines to manage pain, swelling, and more.  Your surgeon or anesthesiologist gave you EXPAREL(bupivacaine) to help control your pain after surgery.  EXPAREL is a local anesthetic designed to release slowly over an extended period of time to provide pain relief by numbing the tissue around the surgical site. EXPAREL is designed to release pain medication over time and can control pain for up to 72 hours. Depending on how you respond to EXPAREL, you may require less pain medication during your recovery. EXPAREL can help reduce or eliminate the need for opioids during the first few days after surgery when pain relief is needed the most. EXPAREL is not an opioid and is not addictive. It does not cause sleepiness or sedation.   Important! A teal colored band has been placed on your arm with the date, time and amount of EXPAREL you have received. Please leave this armband in place for the full 96 hours following administration, and then you may remove the band. If you return to the hospital for any reason within 96 hours following the administration of EXPAREL, the armband provides important information that your health care providers to know, and alerts them that you have received this anesthetic.    Possible side effects of EXPAREL: Temporary loss of sensation or ability to move in the area where medication was injected. Nausea, vomiting, constipation Rarely, numbness and tingling in your mouth or lips, lightheadedness, or anxiety may occur. Call your doctor right away if you think you may be experiencing any of these sensations, or if you have other questions regarding possible side  effects.  Follow all other discharge instructions given to you by your surgeon or nurse. Eat a healthy diet and drink plenty of water or other fluids. Post Anesthesia Home Care Instructions  Activity: Get plenty of rest for the remainder of the day. A responsible individual must stay with you for 24 hours following the procedure.  For the next 24 hours, DO NOT: -Drive a car -Advertising copywriter -Drink alcoholic beverages -Take any medication unless instructed by your physician -Make any legal decisions or sign important papers.  Meals: Start with liquid foods such as gelatin or soup. Progress to regular foods as tolerated. Avoid greasy, spicy, heavy foods. If nausea and/or vomiting occur, drink only clear liquids until the nausea and/or vomiting subsides. Call your physician if vomiting continues.  Special Instructions/Symptoms: Your throat may feel dry or sore from the anesthesia or the breathing tube placed in your throat during surgery. If this causes discomfort, gargle with warm salt water. The discomfort should disappear within 24 hours.  If you had a scopolamine patch placed behind your ear for the management of post- operative nausea and/or vomiting:  1. The medication in the patch is effective for 72 hours, after which it should be removed.  Wrap patch in a tissue and discard in the trash. Wash hands thoroughly with soap and water. 2. You may remove the patch earlier than 72 hours if you experience unpleasant side effects which may include dry mouth, dizziness or visual disturbances. 3. Avoid touching the patch. Wash your hands with soap and water after contact with the patch.       JP Drain [employer]  Bring this sheet to all of your post-operative appointments while you have your drains. Please measure your drains by CC's or ML's. Make sure you drain and measure your JP Drains 2 or 3 times per day. At the end of each day, add up totals for the left side and add up totals for the  right side.    ( 9 am )     ( 3 pm )        ( 9 pm )                Date L  R  L  R  L  R  Total L/R                                                                                                                                                                                       NO tylenol until 10:45 p.m.  INSTRUCTIONS FOR AFTER BREAST SURGERY   You will likely have some questions about what to expect following your operation.  The following information will help you and your family understand what to expect when you are discharged from the hospital.  It is important to follow these guidelines to help ensure a smooth recovery and reduce complication.  Postoperative instructions include information on: diet, wound care, medications and physical activity.  AFTER SURGERY Expect to go home after the procedure.  In some cases, you may need to spend one night in the hospital for observation.  DIET Breast surgery does not require a specific diet.  However, the healthier you eat the better your body will heal. It is important to increasing your protein intake.  This means limiting the foods with sugar and carbohydrates.  Focus on vegetables and some meat.  If you have liposuction during your procedure be sure to drink water.  If your urine is bright yellow, then it is concentrated, and you need to drink more water.  As a general rule after surgery, you should have 8 ounces of water every hour while awake.  If you find you are persistently nauseated or unable to take in liquids let us know.  NO TOBACCO USE or EXPOSURE.  This will slow your healing process and lead to a wound.  WOUND CARE Leave the binder on at all times except when showering . Use fragrance free soap like Dial, Dove or [place].   After 24 hours you can remove the binder to shower. Once dry apply binder or sports bra. No baths, pools or hot tubs for four weeks. We close your incision to leave the smallest and best-looking  scar. No ointment or creams on your  incisions for four weeks.  No Neosporin (Too many skin reactions).  A few weeks after surgery you can use Mederma and start massaging the scar. We ask you to wear your binder or sports bra for the first 6 weeks around the clock, including while sleeping. This provides added comfort and helps reduce the fluid accumulation at the surgery site. NO Ice or heating pads to the operative site.  You have a very high risk of a BURN before you feel the temperature change. Continue to empty, recharge, & record drainage from drains 2-3 times a day, as needed.  ACTIVITY No heavy lifting until cleared by the doctor.  This usually means no more than a half-gallon of milk.  It is OK to walk and climb stairs. Moving your legs is very important to decrease your risk of a blood clot.  It will also help keep you from getting deconditioned.  Every 1 to 2 hours get up and walk for 5 minutes. This will help with a quicker recovery back to normal.  Let pain be your guide so you don't do too much.  This time is for you to recover.  You will be more comfortable if you sleep and rest with your head elevated either with a few pillows under you or in a recliner.  No stomach sleeping for a three months.  WORK Everyone returns to work at different times. As a rough guide, most people take at least 1 - 2 weeks off prior to returning to work. If you need documentation for your job, give the forms to the front staff at the clinic.  DRIVING Arrange for someone to bring you home from the hospital after your surgery.  You may be able to drive a few days after surgery but not while taking any narcotics or valium.  BOWEL MOVEMENTS Constipation can occur after anesthesia and while taking pain medication.  It is important to stay ahead for your comfort.  We recommend taking Milk of Magnesia (2 tablespoons; twice a day) while taking the pain pills.  MEDICATIONS You may be prescribed should start after  surgery At your preoperative visit for you history and physical you may have been given the following medications: Zofran 4 mg:  This is to treat nausea and vomiting.  You can take this every 6 hours as needed and only if needed. Oxycodone 5 mg:  This is only to be used after you have taken the Motrin or the Tylenol. Every 8 hours as needed.   Over the counter Medication to take: Ibuprofen (Motrin) 600 mg:  Take this every 6 hours.  If you have additional pain then take 500 mg of the Tylenol every 8 hours.  Only take the Norco after you have tried these two. MiraLAX or Milk of Magnesia: Take this according to the bottle if you take the Norco.  WHEN TO CALL Call your surgeon's office if any of the following occur: Fever 101 degrees F or greater Excessive bleeding or fluid from the incision site. Pain that increases over time without aid from the medications Redness, warmth, or pus draining from incision sites Persistent nausea or inability to take in liquids Severe misshapen area that underwent the operation.  Here are some resources for breast cancer patients:  Plastic surgery website: https://www.plasticsurgery.org/for-medical-professionals/education-and-resources/publications/breast-reconstruction-magazine Breast Reconstruction Awareness Campaign:  [employer]fr Plastic surgery Implant information:  https://www.plasticsurgery.org/patient-safety/breast-implant-safety

## 2023-03-19 NOTE — Interval H&P Note (Signed)
History and Physical Interval Note: No change in exam or indication for surgery. All questions answered. Marked for a bilateral breast reduction with her assistance. Will proceed at her request  03/19/2023 12:03 PM  [place] Shawda Channing  has presented today for surgery, with the diagnosis of Macromastia.  The various methods of treatment have been discussed with the patient and family. After consideration of risks, benefits and other options for treatment, the patient has consented to  Procedure(s): bilateral breast reduction (Bilateral) as a surgical intervention.  The patient's history has been reviewed, patient examined, no change in status, stable for surgery.  I have reviewed the patient's chart and labs.  Questions were answered to the patient's satisfaction.     Santiago Glad

## 2023-03-19 NOTE — Transfer of Care (Signed)
Immediate Anesthesia Transfer of Care Note  Patient: Sabrina Holder  Procedure(s) Performed: bilateral breast reduction (Bilateral: Breast)  Patient Location: PACU  Anesthesia Type:General  Level of Consciousness: awake  Airway & Oxygen Therapy: Patient Spontanous Breathing and Patient connected to face mask oxygen  Post-op Assessment: Report given to RN and Post -op Vital signs reviewed and stable  Post vital signs: Reviewed and stable  Last Vitals:  Vitals Value Taken Time  BP 125/71 03/19/23 1630  Temp    Pulse 86 03/19/23 1631  Resp 14 03/19/23 1631  SpO2 100 % 03/19/23 1631  Vitals shown include unfiled device data.  Last Pain:  Vitals:   03/19/23 0924  TempSrc: Temporal  PainSc: 0-No pain      Patients Stated Pain Goal: 3 (03/19/23 0924)  Complications: No notable events documented.

## 2023-03-19 NOTE — Op Note (Signed)
DATE OF OPERATION: 03/19/2023  LOCATION: [facility] operating Room  PREOPERATIVE DIAGNOSIS: Symptomatic macromastia  POSTOPERATIVE DIAGNOSIS: Same  PROCEDURE: Bilateral breast reduction  SURGEON: Loren Racer, MD  ASSISTANT: Caroline More  EBL: 100 cc  CONDITION: Stable  COMPLICATIONS: None  INDICATION: The patient, Sabrina Holder, is a 42 y.o. female born on [birth year], is here for treatment of upper back and neck pain secondary to large breast size as well as ongoing rashes on the posterior aspect of the breast..   PROCEDURE DETAILS:  The patient was seen prior to surgery and marked.   IV antibiotics were given. The patient was taken to the operating room and given a general anesthetic. A standard time out was performed and all information was confirmed by those in the room. SCDs were placed.   The chest was prepped and draped in usual sterile manner.  A 42 mm cookie cutter was used to outline the nipple areolar complexes bilaterally and 8 cm based inferior pedicles were outlined on each breast.  The right breast was addressed first.  Laparotomy tape was placed at the base of the breast and the pedicle de-epithelialized sharply.  The electrocautery was used to dissect the borders of the pedicle down to the chest wall.  The medial lateral and superior triangles of breast tissue were resected with the electrocautery and the superior skin flap was developed and thinned.  The tissue removed constituted the bulk of the reduction on the right side and weighed 641 g.  The surgical site was inspected for bleeding and meticulous hemostasis achieved with the electrocautery.  Surgical wound was irrigated with warm normal saline and the subfascial space over the pectoralis muscle and the subcutaneous tissues were infiltrated with a mixture of Exparel and quarter percent plain Marcaine.  A 19 French round drain was placed behind the pedicle and brought out through a separate stab incision.  The  T point was approximated with a single 2-0 Monocryl suture and the skin edges were tailor tacked in place with skin clips.  The dermis was closed with interrupted and running 3-0 Monocryl sutures and the skin was closed with a running 4-0 Monocryl subcuticular stitch.  Attention was turned to the left breast where similar procedure was performed.  After placing a laparotomy tape at the base of the breast as a tourniquet the pedicle was de-epithelialized and the electrocautery used to dissected the borders to the chest wall.  The medial lateral and superior triangles of breast tissue were excised and the superior skin flap developed and thinned with the electrocautery.  The tissue removed constituted the bulk of the reduction and weighed 705 g.  All breast tissue was sent to pathology for routine examination.  The surgical site was inspected for bleeding and meticulous hemostasis achieved.  The wound was irrigated with warm normal saline and again the subfascial space over the pectoralis and subcutaneous tissues were infiltrated with the Exparel mixture.  19 French round drains were placed behind the pedicle and brought out through a separate stab incision.  The T point was approximated with a single 2-0 Monocryl suture and the skin edges tailor tacked in place with skin clips.  The dermis was closed with interrupted and running 3-0 Monocryl sutures and the skin was closed with the running 4-0 Monocryl subcuticular stitch.  The incisions were sealed with Dermabond and the patient was placed in a supportive compressive garment.  She was awakened from anesthesia without incident transferred to the recovery room in good  condition all instrument needle and sponge counts were reported as correct and there were no complications noted. The patient was allowed to wake up and taken to recovery room in stable condition at the end of the case. The family was notified at the end of the case.   The advanced practice  practitioner (APP) assisted throughout the case.  The APP was essential in retraction and counter traction when needed to make the case progress smoothly.  This retraction and assistance made it possible to see the tissue plans for the procedure.  The assistance was needed for blood control, tissue re-approximation and assisted with closure of the incision site.

## 2023-03-19 NOTE — Anesthesia Procedure Notes (Signed)
Procedure Name: Intubation Date/Time: 03/19/2023 12:35 PM  Performed by: Demetrio Lapping, CRNAPre-anesthesia Checklist: Patient identified, Emergency Drugs available, Suction available and Patient being monitored Patient Re-evaluated:Patient Re-evaluated prior to induction Oxygen Delivery Method: Circle System Utilized Preoxygenation: Pre-oxygenation with 100% oxygen Induction Type: IV induction Ventilation: Mask ventilation without difficulty Laryngoscope Size: Mac and 3 Grade View: Grade I Tube type: Oral Tube size: 7.0 mm Number of attempts: 1 Airway Equipment and Method: Stylet Placement Confirmation: ETT inserted through vocal cords under direct vision, positive ETCO2 and breath sounds checked- equal and bilateral Secured at: 21 cm Tube secured with: Tape Dental Injury: Teeth and Oropharynx as per pre-operative assessment

## 2023-03-20 ENCOUNTER — Other Ambulatory Visit: Payer: Self-pay

## 2023-03-20 ENCOUNTER — Encounter: Payer: Self-pay | Admitting: Student

## 2023-03-20 ENCOUNTER — Ambulatory Visit: Payer: Medicaid Other | Admitting: Student

## 2023-03-20 VITALS — BP 99/65 | HR 82 | Ht 64.0 in | Wt 181.0 lb

## 2023-03-20 DIAGNOSIS — N62 Hypertrophy of breast: Secondary | ICD-10-CM

## 2023-03-20 NOTE — Progress Notes (Signed)
Patient is a 42 year old female who underwent bilateral breast reduction with Dr. Ladona Ridgel yesterday, 03/19/2023.  Intraoperatively, patient had 641 g removed from the right breast and 705 g removed from the left breast.  She presents to the clinic today for postoperative follow-up.  Today, she reports she is doing well.  She states that she has had a little bit of soreness, but otherwise her pains been controlled with medications.  She states that she has been eating and drinking without issue.  Denies any nausea not vomiting.  She denies any acute events overnight.  Reports she has been ambulating without any issue.    Chaperone present on exam.  On exam, patient is sitting upright in no acute distress.  Breasts are soft and symmetric.  Some swelling noted bilaterally.  No overlying erythema.  No fluid collections palpated on exam.  NAC's appear to be healthy.  Sensation intact bilaterally.  Incisions are clean dry and intact.  JP drains are intact and functioning.  There is minimal serosanguineous drainage in each of the bulbs.  JP drains were removed without any difficulty.  Patient tolerated well.  Discussed with patient that her drain sites might leak a little bit over the next few days.  Discussed with her she may apply gauze and tape over the drain sites daily or as needed for saturation.  Patient expressed understanding.  Discussed with patient to continue compression at all times.  Patient to follow-up at her next scheduled appointment.  Instructed her to call in the meantime she has any questions or concerns about anything.

## 2023-03-20 NOTE — Anesthesia Postprocedure Evaluation (Signed)
Anesthesia Post Note  Patient: Sabrina Holder  Procedure(s) Performed: bilateral breast reduction (Bilateral: Breast)     Patient location during evaluation: PACU Anesthesia Type: General Level of consciousness: awake and alert Pain management: pain level controlled Vital Signs Assessment: post-procedure vital signs reviewed and stable Respiratory status: spontaneous breathing, nonlabored ventilation, respiratory function stable and patient connected to nasal cannula oxygen Cardiovascular status: blood pressure returned to baseline and stable Postop Assessment: no apparent nausea or vomiting Anesthetic complications: no   No notable events documented.  Last Vitals:  Vitals:   03/19/23 1655 03/19/23 1719  BP: 126/73 134/83  Pulse: 83 77  Resp: 11 20  Temp:  (!) 36.2 C  SpO2: 99% 98%    Last Pain:  Vitals:   03/20/23 0931  TempSrc:   PainSc: 4                  Collene Schlichter

## 2023-03-21 LAB — SURGICAL PATHOLOGY

## 2023-03-28 ENCOUNTER — Ambulatory Visit: Payer: Medicaid Other | Admitting: Plastic Surgery

## 2023-03-28 ENCOUNTER — Telehealth: Payer: Medicaid Other | Admitting: Physician Assistant

## 2023-03-28 VITALS — BP 124/79 | HR 95

## 2023-03-28 DIAGNOSIS — J4521 Mild intermittent asthma with (acute) exacerbation: Secondary | ICD-10-CM

## 2023-03-28 DIAGNOSIS — Z9889 Other specified postprocedural states: Secondary | ICD-10-CM

## 2023-03-28 MED ORDER — PREDNISONE 20 MG PO TABS: 40.0000 mg | ORAL_TABLET | Freq: Every day | ORAL | 0 refills | Status: DC

## 2023-03-28 MED ORDER — ALBUTEROL SULFATE HFA 108 (90 BASE) MCG/ACT IN AERS: 1.0000 | INHALATION_SPRAY | Freq: Four times a day (QID) | RESPIRATORY_TRACT | 0 refills | Status: AC | PRN

## 2023-03-28 NOTE — Progress Notes (Signed)

## 2023-03-28 NOTE — Progress Notes (Signed)
Ms. Behnken returns today 8 days postop from a bilateral breast reduction.  She states that she is doing well with no specific complaints.  She is only using occasional Tylenol for discomfort.  She is very happy with the results from her surgery.  On examination the incisions are clean dry and intact.  The nipples are warm and well-perfused.  There is several small sutures sticking out at the edges of the incisions.  These were trimmed without difficulty.  8 days status post bilateral breast reduction.  Doing well.  Discussed beginning scar massage next week.  She may slowly increase activity as tolerated with the exception of no heavy lifting or vigorous activity.  Keep scheduled appointment return for any questions or concerns

## 2023-04-09 ENCOUNTER — Other Ambulatory Visit: Payer: Self-pay

## 2023-04-09 NOTE — Progress Notes (Signed)
 Specialty Pharmacy Refill Coordination Note  Sabrina Holder is a 43 y.o. female contacted today regarding refills of specialty medication(s) Dupilumab  (Dupixent )   Patient requested Delivery   Delivery date: 04/16/23   Verified address: [address]   [place] [id number]   Medication will be filled on 04/15/23.

## 2023-04-15 ENCOUNTER — Ambulatory Visit (INDEPENDENT_AMBULATORY_CARE_PROVIDER_SITE_OTHER): Payer: Medicaid Other | Admitting: Student

## 2023-04-15 ENCOUNTER — Other Ambulatory Visit: Payer: Self-pay

## 2023-04-15 ENCOUNTER — Ambulatory Visit: Payer: Medicaid Other | Admitting: Student

## 2023-04-15 VITALS — BP 135/83 | HR 74

## 2023-04-15 DIAGNOSIS — Z9889 Other specified postprocedural states: Secondary | ICD-10-CM

## 2023-04-15 MED ORDER — CEPHALEXIN 500 MG PO CAPS: 500.0000 mg | ORAL_CAPSULE | Freq: Four times a day (QID) | ORAL | 0 refills | Status: AC

## 2023-04-15 NOTE — Progress Notes (Signed)
 Patient is a 43 year old female who underwent bilateral breast reduction with Dr. Waddell on 03/19/2023.  She is almost 1 month postop.  She presents to the clinic today for concerns about a wound to her breast.  Patient was last seen in the clinic on 03/28/2023.  At this visit, patient was doing well.  Incisions were clean dry and intact.  Nipples were warm and well-perfused.  Today, patient reports she is doing well.  She states that yesterday, she lifted up her grandson at church and then noticed a significant amount of drainage from her left breast.  She states that her left breast did feel somewhat firm, but it has been draining since yesterday and has softened up.  She denies any fevers or chills.  She denies any pain to the left breast.  She denies any issues with the right side.  Chaperone present on exam.  On exam, patient is sitting upright in no acute distress.  Breasts are soft and symmetric.  To the right breast, there is no overlying erythema.  There does appear to be a little bit of irritation/mild erythema to the skin near the left vertical limb incision.  It is nontender to palpation.  No fluctuance noted.  NAC's appear to be healthy bilaterally.  There is a small superficial 1 cm x 1 cm wound noted to the inferior T-zone of the right breast.  No surrounding erythema or drainage noted.  Incisions to the right breast are otherwise intact and healing well.  To the left breast, there is a superficial wound noted to the inferior T-zone as well as a small wound noted just superior of the inferior T-zone.  I was able to express a very small amount of of what appeared to be liquefied fat.  No purulent or foul-smelling drainage.  No significant amount of drainage was noted.  Incision to the left breast is otherwise intact.  Discussed with the patient that is most likely she had some fluid to her left breast that it has now likely been drained through her left breast wound.  I recommended that she  apply Vaseline to all aspects of her incisions including her wounds except for the wound to the left vertical limb incision.  Discussed with her how to like this to drain for another few days.  Discussed with her she may place gauze or maxi pad over the wound to help with the drainage.  I discussed with her that starting on Wednesday, she may start applying Vaseline to this area.  Patient expressed understanding.  Discussed with patient that given the little bit of irritation to the skin around her left breast, I am going to start her on an antibiotic.  Sent in Keflex  for 1 week.  Patient expressed understanding.  Discussed with the patient to closely monitor her left breast.  Discussed with her that if she has any increased redness, pain, changes in the characteristics of her drainage, fevers, chills she should contact us .  Patient expressed understanding.  Discussed with patient the importance of wearing her compression at all times.  Patient states that she has appointment this Friday.  Recommended that she keep this appointment.  Instructed her to call in the meantime she has any questions or concerns.  Pictures were obtained of the patient and placed in the chart with the patient's or guardian's permission.

## 2023-04-15 NOTE — Progress Notes (Signed)
 Rescheduled

## 2023-04-18 NOTE — Progress Notes (Signed)
Patient is a 43 year old female here for follow-up after bilateral breast reduction with Dr. Ladona Ridgel on 03/19/2023.  She is 1 month postop.  She was last seen here in the office 4 days ago for evaluation of left breast wound and left breast wound drainage.  She was prescribed Keflex.  Reports today that she is overall doing well.  She has been applying Vaseline to her breast wounds.  She is not having any infectious symptoms.  She does have some questions about ongoing care and about normal recovery symptoms.  Chaperone present on exam On exam bilateral NAC's are viable, bilateral breasts are soft, symmetric.  There is no erythema or cellulitic changes noted.  No subcutaneous fluid collection noted palpation.  She does have breast wounds noted bilaterally at the T-junction.  Left breast wound is extending from the T-junction at the vertical limb and inframammary fold and extending along the vertical limb.  There is no surrounding erythema or cellulitic changes noted.  There is no active drainage noted.  Right breast wound is at the junction of the vertical limb and inframammary fold, wound is approximately 1 x 1 mm.  No surrounding erythema or cellulitic changes noted.  A/P:  Recommend Vaseline and gauze to left and right breast wounds.  Recommend covering these with gauze and securing with breast binder.  Continue with compressive garments, which in his activities or heavy lifting.  Recommend following up in 2 to 3 weeks for reevaluation.  Pictures were obtained of the patient and placed in the chart with the patient's or guardian's permission.

## 2023-04-19 ENCOUNTER — Ambulatory Visit (INDEPENDENT_AMBULATORY_CARE_PROVIDER_SITE_OTHER): Payer: Managed Care, Other (non HMO) | Admitting: Surgical

## 2023-04-19 DIAGNOSIS — Z9889 Other specified postprocedural states: Secondary | ICD-10-CM

## 2023-05-02 ENCOUNTER — Other Ambulatory Visit: Payer: Self-pay

## 2023-05-02 NOTE — Progress Notes (Signed)
Specialty Pharmacy Refill Coordination Note  Sabrina Holder is a 43 y.o. female contacted today regarding refills of specialty medication(s) Dupilumab (Dupixent)   Patient requested Delivery   Delivery date: 05/14/23   Verified address: [address]   [place] [phone]   Medication will be filled on 05/13/23.

## 2023-05-02 NOTE — Progress Notes (Signed)
Specialty Pharmacy Ongoing Clinical Assessment Note  Sabrina Holder is a 43 y.o. female who is being followed by the specialty pharmacy service for RxSp Asthma/COPD   Patient's specialty medication(s) reviewed today: Dupilumab (Dupixent)   Missed doses in the last 4 weeks: 0   Patient/Caregiver did not have any additional questions or concerns.   Therapeutic benefit summary: Patient is achieving benefit   Adverse events/side effects summary: No adverse events/side effects   Patient's therapy is appropriate to: Continue    Goals Addressed             This Visit's Progress    Minimize recurrence of flares       Patient is on track. Patient will maintain adherence         Follow up:  6 months  Bobette Mo Specialty Pharmacist

## 2023-05-03 ENCOUNTER — Ambulatory Visit (INDEPENDENT_AMBULATORY_CARE_PROVIDER_SITE_OTHER): Payer: Medicaid Other | Admitting: Physician Assistant

## 2023-05-03 VITALS — BP 134/83 | HR 84

## 2023-05-03 DIAGNOSIS — M793 Panniculitis, unspecified: Secondary | ICD-10-CM

## 2023-05-03 MED ORDER — ONDANSETRON 4 MG PO TBDP: 4.0000 mg | ORAL_TABLET | Freq: Three times a day (TID) | ORAL | 0 refills | Status: DC | PRN

## 2023-05-03 MED ORDER — OXYCODONE HCL 5 MG PO TABS: 5.0000 mg | ORAL_TABLET | Freq: Three times a day (TID) | ORAL | 0 refills | Status: AC | PRN

## 2023-05-03 NOTE — Progress Notes (Cosign Needed)
Patient ID: Sabrina Holder, female    DOB: 01-07-1981, 43 y.o.   MRN: [id number]  Chief Complaint  Patient presents with   Pre-op Exam      ICD-10-CM   1. Panniculitis  M79.3        History of Present Illness: Sabrina Holder is a 43 y.o.  female  with a history of panniculitis.  She presents for preoperative evaluation for upcoming procedure, panniculectomy, scheduled for 05/21/2023 with Dr.  Ladona Ridgel .  The patient has not had problems with anesthesia.  Recent breast reduction surgery without complication.  She denies any personal or family history of blood clots or clotting disorder.  Denies any personal history of severe cardiac or pulmonary disease, need for anticoagulation, cancer, varicosities, or nicotine use.  Discussed expectations for panniculectomy as well as postoperative recovery and activity restrictions.  She will hold her Ozempic injection 1 week prior to surgery.  She may continue with her other medications.  She is no longer taking prednisone.  Her husband will be assisting with her postoperative recovery.  Summary of Previous Visit: She was seen for consult 02/06/2023.  At that time, expressed interest in adding panniculectomy onto her scheduled breast reduction surgery.  Complained of large overhanging pannus with infra pannus rashes.  Discussed at length what a panniculectomy entailed and patient was agreeable to proceed.  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  She is hopeful to return 1.5 to 2 weeks after surgery.  PMH Significant for: Recurrent panniculitis and overhanging pannus, recent breast reduction surgery 03/2023, GERD, obesity, asthma, bariatric surgery with subsequent extreme weight loss.   Past Medical History: Allergies: No Known Allergies  Current Medications:  Current Outpatient Medications:    albuterol (PROVENTIL) (2.5 MG/3ML) 0.083% nebulizer solution, Take 3 mLs (2.5 mg total) by nebulization every 6 (six) hours as needed  for wheezing or shortness of breath., Disp: 75 mL, Rfl: 1   albuterol (VENTOLIN HFA) 108 (90 Base) MCG/ACT inhaler, Inhale 1-2 puffs into the lungs every 6 (six) hours as needed., Disp: 8 g, Rfl: 0   arformoterol (BROVANA) 15 MCG/2ML NEBU, Take 2 mLs (15 mcg total) by nebulization 2 (two) times daily., Disp: 120 mL, Rfl: 10   budesonide (PULMICORT) 0.5 MG/2ML nebulizer solution, Take 2 mLs (0.5 mg total) by nebulization 2 (two) times daily., Disp: 1440 mL, Rfl: 0   Dupilumab (DUPIXENT) 300 MG/2ML SOAJ, Inject 300 mg into the skin every 14 (fourteen) days., Disp: 12 mL, Rfl: 0   escitalopram (LEXAPRO) 20 MG tablet, Take 20 mg by mouth daily. , Disp: , Rfl: 1   famotidine (PEPCID) 20 MG tablet, Take 1 tablet (20 mg total) by mouth at bedtime., Disp: 30 tablet, Rfl: 5   fexofenadine (ALLEGRA) 180 MG tablet, Take 180 mg by mouth daily., Disp: , Rfl:    fexofenadine-pseudoephedrine (ALLEGRA-D) 60-120 MG 12 hr tablet, Take 1 tablet by mouth every 12 (twelve) hours., Disp: 30 tablet, Rfl: 0   fluticasone (FLONASE) 50 MCG/ACT nasal spray, Place 2 sprays into both nostrils daily., Disp: 16 g, Rfl: 0   ipratropium (ATROVENT) 0.03 % nasal spray, Place 2 sprays into both nostrils every 12 (twelve) hours., Disp: 30 mL, Rfl: 0   montelukast (SINGULAIR) 10 MG tablet, Take 1 tablet (10 mg total) by mouth daily., Disp: 30 tablet, Rfl: 11   ondansetron (ZOFRAN) 4 MG tablet, Take 1 tablet (4 mg total) by mouth every 8 (eight) hours as needed for up to  20 doses for nausea or vomiting., Disp: 20 tablet, Rfl: 0   ondansetron (ZOFRAN-ODT) 4 MG disintegrating tablet, Take 1 tablet (4 mg total) by mouth every 8 (eight) hours as needed for nausea or vomiting., Disp: 20 tablet, Rfl: 0   oxyCODONE (ROXICODONE) 5 MG immediate release tablet, Take 1 tablet (5 mg total) by mouth every 8 (eight) hours as needed for up to 7 days for severe pain (pain score 7-10)., Disp: 20 tablet, Rfl: 0   OZEMPIC, 2 MG/DOSE, 8 MG/3ML SOPN, Inject 2  mg into the skin once a week., Disp: , Rfl:    pantoprazole (PROTONIX) 40 MG tablet, Take 1 tablet (40 mg total) by mouth daily., Disp: 30 tablet, Rfl: 1   predniSONE (DELTASONE) 20 MG tablet, Take 2 tablets (40 mg total) by mouth daily with breakfast., Disp: 10 tablet, Rfl: 0  Past Medical Problems: Past Medical History:  Diagnosis Date   Anemia    Anxiety    Asthma    Depression    Environmental allergies    GERD (gastroesophageal reflux disease)    Renal disorder     Past Surgical History: Past Surgical History:  Procedure Laterality Date   ABDOMINAL HYSTERECTOMY     BREAST REDUCTION SURGERY Bilateral 03/19/2023   Procedure: bilateral breast reduction;  Surgeon: Santiago Glad, MD;  Location: [facility];  Service: Plastics;  Laterality: Bilateral;   CESAREAN SECTION     X 3   COLONOSCOPY N/A 12/26/2020   Procedure: COLONOSCOPY;  Surgeon: Jaynie Collins, DO;  Location: [facility];  Service: Gastroenterology;  Laterality: N/A;   CYSTOSCOPY N/A 12/15/2019   Procedure: CYSTOSCOPY;  Surgeon: Conard Novak, MD;  Location: [facility];  Service: Gynecology;  Laterality: N/A;   ESOPHAGOGASTRODUODENOSCOPY N/A 12/26/2020   Procedure: ESOPHAGOGASTRODUODENOSCOPY (EGD);  Surgeon: Jaynie Collins, DO;  Location: [facility] END[facility];  Service: Gastroenterology;  Laterality: N/A;   TOTAL LAPAROSCOPIC HYSTERECTOMY WITH SALPINGECTOMY Bilateral 12/15/2019   Procedure: TOTAL LAPAROSCOPIC HYSTERECTOMY WITH SALPINGECTOMY;  Surgeon: Conard Novak, MD;  Location: [facility];  Service: Gynecology;  Laterality: Bilateral;   TUBAL LIGATION      Social History: Social History   Socioeconomic History   Marital status: Married    Spouse name: Not on file   Number of children: Not on file   Years of education: Not on file   Highest education level: Not on file  Occupational History   Not on file  Tobacco Use   Smoking status: Former    Current packs/day: 0.00     Average packs/day: 1 pack/day for 20.0 years (20.0 ttl pk-yrs)    Types: Cigarettes    Start date: 11/01/2001    Quit date: 11/01/2021    Years since quitting: 1.5   Smokeless tobacco: Never   Tobacco comments:    Quit in 2022 and recently started back.  Stopped again a week and a half ago on wellbutrin.  11/13/2021 hfb  Vaping Use   Vaping status: Never Used  Substance and Sexual Activity   Alcohol use: Yes    Comment: occasional (once weekly)   Drug use: No   Sexual activity: Not Currently    Birth control/protection: Surgical    Comment: Tubal ligation  Other Topics Concern   Not on file  Social History Narrative   Not on file   Social Drivers of Health   Financial Resource Strain: Not on file  Food Insecurity: Not on file  Transportation Needs: Not on file  Physical Activity: Not on file  Stress: Not on file  Social Connections: Not on file  Intimate Partner Violence: Not on file    Family History: Family History  Problem Relation Age of Onset   Asthma Mother    Gout Father    Cancer Maternal Grandmother 40       unknown type   Uterine cancer Neg Hx    Ovarian cancer Neg Hx    Breast cancer Neg Hx     Review of Systems: ROS Denies any recent chest pain, difficulty breathing, leg swelling, fevers.  Physical Exam: Vital Signs BP 134/83 (BP Location: Left Arm, Patient Position: Sitting, Cuff Size: Normal)   Pulse 84   LMP 10/20/2019 (Approximate)   SpO2 98%   Physical Exam Constitutional:      General: Not in acute distress.    Appearance: Normal appearance. Not ill-appearing.  HENT:     Head: Normocephalic and atraumatic.  Eyes:     Pupils: Pupils are equal, round. Cardiovascular:     Rate and Rhythm: Normal rate.    Pulses: Normal pulses.  Pulmonary:     Effort: No respiratory distress or increased work of breathing.  Speaks in full sentences. Abdominal:     General: Abdomen is flat. No distension.   Musculoskeletal: Normal range of motion. No  lower extremity swelling or edema. No varicosities. Skin:    General: Skin is warm and dry.     Findings: No erythema or rash.  Neurological:     Mental Status: Alert and oriented to person, place, and time.  Psychiatric:        Mood and Affect: Mood normal.        Behavior: Behavior normal.    Assessment/Plan: The patient is scheduled for panniculectomy with Dr.  Ladona Ridgel .  Risks, benefits, and alternatives of procedure discussed, questions answered and consent obtained.    Smoking Status: Non-smoker.  Caprini Score: 4; Risk Factors include: Age, BMI greater than 25, and length of planned surgery. Recommendation for mechanical prophylaxis. Encourage early ambulation.   Pictures obtained: 02/06/2023  Post-op Rx sent to pharmacy: Oxycodone, Zofran.  Patient was provided with the General Surgical Risk consent document and Pain Medication Agreement prior to their appointment.  They had adequate time to read through the risk consent documents and Pain Medication Agreement. We also discussed them in person together during this preop appointment. All of their questions were answered to their satisfaction.  Recommended calling if they have any further questions.  Risk consent form and Pain Medication Agreement to be scanned into patient's chart.  The risk that can be encountered for this procedure were discussed and include the following but not limited to these: asymmetry, fluid accumulation, firmness of the tissue, skin loss, decrease or no sensation, fat necrosis, bleeding, infection, healing delay.  Deep vein thrombosis, cardiac and pulmonary complications are risks to any procedure.  There are risks of anesthesia, changes to skin sensation and injury to nerves or blood vessels.  The muscle can be temporarily or permanently injured.  You may have an allergic reaction to tape, suture, glue, blood products which can result in skin discoloration, swelling, pain, skin lesions, poor healing.  Any of  these can lead to the need for revisonal surgery or stage procedures.  Weight gain and weigh loss can also effect the long term appearance. The results are not guaranteed to last a lifetime.  Future surgery may be required.      Electronically signed by: Gerre Pebbles  Chilton Si, PA-C 05/03/2023 1:44 PM

## 2023-05-13 ENCOUNTER — Other Ambulatory Visit (HOSPITAL_COMMUNITY): Payer: Self-pay

## 2023-05-13 ENCOUNTER — Telehealth: Payer: Self-pay

## 2023-05-13 ENCOUNTER — Other Ambulatory Visit: Payer: Self-pay | Admitting: Pharmacist

## 2023-05-13 DIAGNOSIS — J4550 Severe persistent asthma, uncomplicated: Secondary | ICD-10-CM

## 2023-05-13 MED ORDER — DUPIXENT 300 MG/2ML ~~LOC~~ SOAJ: 300.0000 mg | SUBCUTANEOUS | 0 refills | Status: DC

## 2023-05-13 NOTE — Telephone Encounter (Signed)
 Received notification from [facility] regarding a prior authorization for DUPIXENT . Authorization has been APPROVED from 05/13/2023 to 05/12/2024. Approval letter sent to scan center.  Patient is [employer] eligible.  Patient must fill through [facility]: [phone]  Authorization # [id number]  Rx sent to [facility] today Patient enrolled into Dupixent  copay card BIN: [id number] PCN: [id number] Group: [id number] ID: [id number]  MyChart message sent to patient - I also talked to patient who not aware of new insurance change being active. Since she is not sure if Medicaid and new insurance have coordinated benefits, I've signed her up for copay card today  Geraldene Kleine, PharmD, MPH, BCPS, CPP Clinical Pharmacist (Rheumatology and Pulmonology)

## 2023-05-13 NOTE — Telephone Encounter (Signed)
 Received notification from [facility] pharmacy that patient has obtained new insurance and requires a new authorization for their medication.  Submitted an URGENT Prior Authorization request to [facility] for DUPIXENT  via CoverMyMeds. Will update once we receive a response.  Key: [id number]

## 2023-05-13 NOTE — Progress Notes (Signed)
 Patient with insurance change. Must fill through [facility]

## 2023-05-14 ENCOUNTER — Ambulatory Visit
Admission: EM | Admit: 2023-05-14 | Discharge: 2023-05-14 | Discharge status: Against Medical Advice | Payer: Managed Care, Other (non HMO) | Attending: Emergency Medicine | Admitting: Emergency Medicine

## 2023-05-14 ENCOUNTER — Encounter (HOSPITAL_BASED_OUTPATIENT_CLINIC_OR_DEPARTMENT_OTHER): Payer: Self-pay | Admitting: Plastic Surgery

## 2023-05-14 ENCOUNTER — Other Ambulatory Visit: Payer: Self-pay

## 2023-05-14 NOTE — ED Triage Notes (Signed)
Patient arrives with complaints of bilateral knee pain x2 weeks. Took tylenol at home with minimal relief. Rates her pain a 6/10. No falls or injuries.

## 2023-05-17 ENCOUNTER — Telehealth: Payer: Managed Care, Other (non HMO) | Admitting: Nurse Practitioner

## 2023-05-17 DIAGNOSIS — G8929 Other chronic pain: Secondary | ICD-10-CM | POA: Diagnosis not present

## 2023-05-17 DIAGNOSIS — M25562 Pain in left knee: Secondary | ICD-10-CM | POA: Diagnosis not present

## 2023-05-17 MED ORDER — PREDNISONE 10 MG (21) PO TBPK
ORAL_TABLET | ORAL | 0 refills | Status: DC
Start: 2023-05-17 — End: 2023-07-05

## 2023-05-17 NOTE — Progress Notes (Signed)
Virtual Visit Consent   Sabrina Holder, you are scheduled for a virtual visit with a [facility] provider today. Just as with appointments in the office, your consent must be obtained to participate. Your consent will be active for this visit and any virtual visit you may have with one of our providers in the next 365 days. If you have a MyChart account, a copy of this consent can be sent to you electronically.  As this is a virtual visit, video technology does not allow for your provider to perform a traditional examination. This may limit your provider's ability to fully assess your condition. If your provider identifies any concerns that need to be evaluated in person or the need to arrange testing (such as labs, EKG, etc.), we will make arrangements to do so. Although advances in technology are sophisticated, we cannot ensure that it will always work on either your end or our end. If the connection with a video visit is poor, the visit may have to be switched to a telephone visit. With either a video or telephone visit, we are not always able to ensure that we have a secure connection.  By engaging in this virtual visit, you consent to the provision of healthcare and authorize for your insurance to be billed (if applicable) for the services provided during this visit. Depending on your insurance coverage, you may receive a charge related to this service.  I need to obtain your verbal consent now. Are you willing to proceed with your visit today? Sabrina Holder has provided verbal consent on 05/17/2023 for a virtual visit (video or telephone). Viviano Simas, FNP  Date: 05/17/2023 7:23 PM   Virtual Visit via Video Note   I, Viviano Simas, connected with  Sabrina Holder  ([id number], 05-May-1980) on 05/17/23 at  7:30 PM EST by a video-enabled telemedicine application and verified that I am speaking with the correct person using two identifiers.  Location: Patient: Virtual Visit Location  Patient: Home Provider: Virtual Visit Location Provider: Home Office   I discussed the limitations of evaluation and management by telemedicine and the availability of in person appointments. The patient expressed understanding and agreed to proceed.    History of Present Illness: Sabrina Holder is a 43 y.o. who identifies as a female who was assigned female at birth, and is being seen today for knee pain   Her left knee has had increasing pain over the past two weeks, radiating pain from knee to hip  Has started to notice some swelling today as well   Most recent episode was 1.5 years ago   Has responded well to prednisone in the past   She has tried tylenol extra strength and ibuprofen 600mg  with some relief    History of gastric sleeve and is limited with NSAID use   Problems:  Patient Active Problem List   Diagnosis Date Noted   Loud snoring 11/13/2021   GERD (gastroesophageal reflux disease) 11/13/2021   Menorrhagia with regular cycle 12/15/2019   Anemia due to chronic blood loss 12/15/2019   Class 2 obesity due to excess calories with body mass index (BMI) of 39.0 to 39.9 in adult 11/05/2017   Prediabetes 11/05/2017   Moderate asthma with acute exacerbation 03/29/2017    Allergies: No Known Allergies Medications:  Current Outpatient Medications:    albuterol (PROVENTIL) (2.5 MG/3ML) 0.083% nebulizer solution, Take 3 mLs (2.5 mg total) by nebulization every 6 (six) hours as needed for wheezing or shortness of  breath., Disp: 75 mL, Rfl: 1   albuterol (VENTOLIN HFA) 108 (90 Base) MCG/ACT inhaler, Inhale 1-2 puffs into the lungs every 6 (six) hours as needed., Disp: 8 g, Rfl: 0   arformoterol (BROVANA) 15 MCG/2ML NEBU, Take 2 mLs (15 mcg total) by nebulization 2 (two) times daily., Disp: 120 mL, Rfl: 10   budesonide (PULMICORT) 0.5 MG/2ML nebulizer solution, Take 2 mLs (0.5 mg total) by nebulization 2 (two) times daily., Disp: 1440 mL, Rfl: 0   Dupilumab (DUPIXENT) 300  MG/2ML SOAJ, Inject 300 mg into the skin every 14 (fourteen) days. **does not need loading dose**, Disp: 4 mL, Rfl: 0   escitalopram (LEXAPRO) 20 MG tablet, Take 20 mg by mouth daily. , Disp: , Rfl: 1   famotidine (PEPCID) 20 MG tablet, Take 1 tablet (20 mg total) by mouth at bedtime., Disp: 30 tablet, Rfl: 5   fexofenadine (ALLEGRA) 180 MG tablet, Take 180 mg by mouth daily., Disp: , Rfl:    fexofenadine-pseudoephedrine (ALLEGRA-D) 60-120 MG 12 hr tablet, Take 1 tablet by mouth every 12 (twelve) hours., Disp: 30 tablet, Rfl: 0   fluticasone (FLONASE) 50 MCG/ACT nasal spray, Place 2 sprays into both nostrils daily., Disp: 16 g, Rfl: 0   ipratropium (ATROVENT) 0.03 % nasal spray, Place 2 sprays into both nostrils every 12 (twelve) hours., Disp: 30 mL, Rfl: 0   montelukast (SINGULAIR) 10 MG tablet, Take 1 tablet (10 mg total) by mouth daily., Disp: 30 tablet, Rfl: 11   ondansetron (ZOFRAN) 4 MG tablet, Take 1 tablet (4 mg total) by mouth every 8 (eight) hours as needed for up to 20 doses for nausea or vomiting., Disp: 20 tablet, Rfl: 0   ondansetron (ZOFRAN-ODT) 4 MG disintegrating tablet, Take 1 tablet (4 mg total) by mouth every 8 (eight) hours as needed for nausea or vomiting., Disp: 20 tablet, Rfl: 0   OZEMPIC, 2 MG/DOSE, 8 MG/3ML SOPN, Inject 2 mg into the skin once a week., Disp: , Rfl:    pantoprazole (PROTONIX) 40 MG tablet, Take 1 tablet (40 mg total) by mouth daily., Disp: 30 tablet, Rfl: 1   predniSONE (DELTASONE) 20 MG tablet, Take 2 tablets (40 mg total) by mouth daily with breakfast., Disp: 10 tablet, Rfl: 0  Observations/Objective: Patient is well-developed, well-nourished in no acute distress.  Resting comfortably  at home.  Head is normocephalic, atraumatic.  No labored breathing.  Speech is clear and coherent with logical content.  Patient is alert and oriented at baseline.    Assessment and Plan:  1. Chronic pain of left knee (Primary)  Recommended follow up with ortho for  knee evaluation and possible imaging   - predniSONE (STERAPRED UNI-PAK 21 TAB) 10 MG (21) TBPK tablet; Take 6 tablets on day one, 5 on day two, 4 on day three, 3 on day four, 2 on day five, and 1 on day six. Take with food.  Dispense: 21 tablet; Refill: 0     Follow Up Instructions: I discussed the assessment and treatment plan with the patient. The patient was provided an opportunity to ask questions and all were answered. The patient agreed with the plan and demonstrated an understanding of the instructions.  A copy of instructions were sent to the patient via MyChart unless otherwise noted below.    The patient was advised to call back or seek an in-person evaluation if the symptoms worsen or if the condition fails to improve as anticipated.    Viviano Simas, FNP

## 2023-05-27 ENCOUNTER — Encounter: Payer: Medicaid Other | Admitting: Plastic Surgery

## 2023-05-27 ENCOUNTER — Other Ambulatory Visit: Payer: Self-pay | Admitting: Internal Medicine

## 2023-05-27 DIAGNOSIS — J4550 Severe persistent asthma, uncomplicated: Secondary | ICD-10-CM

## 2023-05-28 NOTE — Telephone Encounter (Signed)
 Refill for 1 month of Dupixenet. No further refills until OV

## 2023-06-13 ENCOUNTER — Encounter: Payer: Medicaid Other | Admitting: Physician Assistant

## 2023-06-28 ENCOUNTER — Ambulatory Visit (HOSPITAL_BASED_OUTPATIENT_CLINIC_OR_DEPARTMENT_OTHER)
Admission: RE | Admit: 2023-06-28 | Payer: Managed Care, Other (non HMO) | Source: Home / Self Care | Admitting: Plastic Surgery

## 2023-06-28 SURGERY — PANNICULECTOMY
Anesthesia: Choice

## 2023-07-03 ENCOUNTER — Other Ambulatory Visit: Payer: Self-pay | Admitting: Internal Medicine

## 2023-07-03 DIAGNOSIS — J4550 Severe persistent asthma, uncomplicated: Secondary | ICD-10-CM

## 2023-07-04 ENCOUNTER — Encounter: Payer: Medicaid Other | Admitting: Plastic Surgery

## 2023-07-05 ENCOUNTER — Encounter: Payer: Self-pay | Admitting: Internal Medicine

## 2023-07-05 ENCOUNTER — Ambulatory Visit: Payer: Managed Care, Other (non HMO) | Admitting: Internal Medicine

## 2023-07-05 VITALS — BP 116/62 | HR 92 | Ht 64.0 in | Wt 170.0 lb

## 2023-07-05 DIAGNOSIS — J4520 Mild intermittent asthma, uncomplicated: Secondary | ICD-10-CM

## 2023-07-05 DIAGNOSIS — J4550 Severe persistent asthma, uncomplicated: Secondary | ICD-10-CM

## 2023-07-05 DIAGNOSIS — J4540 Moderate persistent asthma, uncomplicated: Secondary | ICD-10-CM | POA: Diagnosis not present

## 2023-07-05 MED ORDER — DUPIXENT 300 MG/2ML ~~LOC~~ SOAJ: SUBCUTANEOUS | 6 refills | Status: DC

## 2023-07-05 NOTE — Patient Instructions (Signed)
 Refill Dupixent Therapy Use albuterol as needed  Avoid Allergens and Irritants Avoid secondhand smoke Avoid SICK contacts Recommend  Masking  when appropriate Recommend Keep up-to-date with vaccinations

## 2023-07-05 NOTE — Progress Notes (Signed)
 @Patient  ID: Sabrina Holder, female    DOB: September 26, 1980, 43 y.o.   MRN: [id number]    CC Follow-up assessment for asthma   HPI: 43 year old female, current smoker. PMH significant for moderate persistent asthma.   No exacerbation at this time No evidence of heart failure at this time No evidence or signs of infection at this time No respiratory distress No fevers, chills, nausea, vomiting, diarrhea No evidence of lower extremity edema No evidence hemoptysis  Started on Dupixent March 2024 Patient has weaned off all nebulizer treatments and maintenance inhaler therapy Patient uses albuterol inhaler as needed very infrequently No exacerbation at this time  Patient lost 100 pounds over the last several months Patient is more active  Patient stopped smoking 2 years ago   No Known Allergies  Immunization History  Administered Date(s) Administered   Influenza-Unspecified 01/01/2022    Past Medical History:  Diagnosis Date   Anemia    Anxiety    Asthma    Depression    Environmental allergies    GERD (gastroesophageal reflux disease)    Renal disorder     Tobacco History: Social History   Tobacco Use  Smoking Status Former   Current packs/day: 0.00   Average packs/day: 1 pack/day for 20.0 years (20.0 ttl pk-yrs)   Types: Cigarettes   Start date: 11/01/2001   Quit date: 11/01/2021   Years since quitting: 1.6  Smokeless Tobacco Never  Tobacco Comments   Quit in 2022 and recently started back.  Stopped again a week and a half ago on wellbutrin.  11/13/2021 hfb   Counseling given: Not Answered Tobacco comments: Quit in 2022 and recently started back.  Stopped again a week and a half ago on wellbutrin.  11/13/2021 hfb   Outpatient Medications Prior to Visit  Medication Sig Dispense Refill   albuterol (PROVENTIL) (2.5 MG/3ML) 0.083% nebulizer solution Take 3 mLs (2.5 mg total) by nebulization every 6 (six) hours as needed for wheezing or shortness of breath. 75  mL 1   albuterol (VENTOLIN HFA) 108 (90 Base) MCG/ACT inhaler Inhale 1-2 puffs into the lungs every 6 (six) hours as needed. 8 g 0   arformoterol (BROVANA) 15 MCG/2ML NEBU Take 2 mLs (15 mcg total) by nebulization 2 (two) times daily. 120 mL 10   budesonide (PULMICORT) 0.5 MG/2ML nebulizer solution Take 2 mLs (0.5 mg total) by nebulization 2 (two) times daily. 1440 mL 0   DUPIXENT 300 MG/2ML SOAJ INJECT 1 PEN UNDER THE SKIN EVERY 14 DAYS 2 mL 6   escitalopram (LEXAPRO) 20 MG tablet Take 20 mg by mouth daily.   1   famotidine (PEPCID) 20 MG tablet Take 1 tablet (20 mg total) by mouth at bedtime. 30 tablet 5   fexofenadine (ALLEGRA) 180 MG tablet Take 180 mg by mouth daily.     fexofenadine-pseudoephedrine (ALLEGRA-D) 60-120 MG 12 hr tablet Take 1 tablet by mouth every 12 (twelve) hours. 30 tablet 0   fluticasone (FLONASE) 50 MCG/ACT nasal spray Place 2 sprays into both nostrils daily. 16 g 0   ipratropium (ATROVENT) 0.03 % nasal spray Place 2 sprays into both nostrils every 12 (twelve) hours. 30 mL 0   montelukast (SINGULAIR) 10 MG tablet Take 1 tablet (10 mg total) by mouth daily. 30 tablet 11   ondansetron (ZOFRAN) 4 MG tablet Take 1 tablet (4 mg total) by mouth every 8 (eight) hours as needed for up to 20 doses for nausea or vomiting. 20 tablet 0  ondansetron (ZOFRAN-ODT) 4 MG disintegrating tablet Take 1 tablet (4 mg total) by mouth every 8 (eight) hours as needed for nausea or vomiting. 20 tablet 0   OZEMPIC, 2 MG/DOSE, 8 MG/3ML SOPN Inject 2 mg into the skin once a week.     pantoprazole (PROTONIX) 40 MG tablet Take 1 tablet (40 mg total) by mouth daily. 30 tablet 1   predniSONE (DELTASONE) 20 MG tablet Take 2 tablets (40 mg total) by mouth daily with breakfast. 10 tablet 0   predniSONE (STERAPRED UNI-PAK 21 TAB) 10 MG (21) TBPK tablet Take 6 tablets on day one, 5 on day two, 4 on day three, 3 on day four, 2 on day five, and 1 on day six. Take with food. 21 tablet 0   No  facility-administered medications prior to visit.   LMP 10/20/2019 (Approximate)   BP 116/62   Pulse 92   Ht 5\' 4"  (1.626 m)   Wt 170 lb (77.1 kg)   LMP 10/20/2019 (Approximate)   SpO2 98%   BMI 29.18 kg/m     Review of Systems: Gen:  Denies  fever, sweats, chills weight loss  HEENT: Denies blurred vision, double vision, ear pain, eye pain, hearing loss, nose bleeds, sore throat Cardiac:  No dizziness, chest pain or heaviness, chest tightness,edema, No JVD Resp:   No cough, -sputum production, -shortness of breath,-wheezing, -hemoptysis,  Other:  All other systems negative   Physical Examination:   General Appearance: No distress  EYES PERRLA, EOM intact.   NECK Supple, No JVD Pulmonary: normal breath sounds, No wheezing.  CardiovascularNormal S1,S2.  No m/r/g.   Abdomen: Benign, Soft, non-tender. Neurology UE/LE 5/5 strength, no focal deficits Ext pulses intact, cap refill intact ALL OTHER ROS ARE NEGATIVE Infected schedule    Lab Results:  CBC    Component Value Date/Time   WBC 4.6 08/17/2022 1308   RBC 4.28 08/17/2022 1308   HGB 12.0 08/17/2022 1308   HGB 12.9 02/26/2014 0948   HCT 37.0 08/17/2022 1308   HCT 40.0 02/26/2014 0948   PLT 273 08/17/2022 1308   PLT 267 02/26/2014 0948   MCV 86.4 08/17/2022 1308   MCV 88 02/26/2014 0948   MCH 28.0 08/17/2022 1308   MCHC 32.4 08/17/2022 1308   RDW 13.8 08/17/2022 1308   RDW 14.5 02/26/2014 0948   LYMPHSABS 2.1 06/29/2022 1919   LYMPHSABS 2.0 02/26/2014 0948   MONOABS 0.3 06/29/2022 1919   MONOABS 0.3 02/26/2014 0948   EOSABS 0.7 (H) 06/29/2022 1919   EOSABS 0.3 02/26/2014 0948   BASOSABS 0.1 06/29/2022 1919   BASOSABS 0.0 02/26/2014 0948    BMET    Component Value Date/Time   NA 138 08/17/2022 1308   NA 139 02/26/2014 0948   K 4.0 08/17/2022 1308   K 4.0 02/26/2014 0948   CL 109 08/17/2022 1308   CL 104 02/26/2014 0948   CO2 23 08/17/2022 1308   CO2 24 02/26/2014 0948   GLUCOSE 86 08/17/2022  1308   GLUCOSE 92 02/26/2014 0948   BUN 12 08/17/2022 1308   BUN 11 02/26/2014 0948   CREATININE 0.60 08/17/2022 1308   CREATININE 0.83 02/26/2014 0948   CALCIUM 8.2 (L) 08/17/2022 1308   CALCIUM 8.8 02/26/2014 0948   GFRNONAA >60 08/17/2022 1308   GFRNONAA >60 02/26/2014 0948   GFRAA >60 12/11/2019 1001   GFRAA >60 02/26/2014 0948      Assessment & Plan:   43 year old pleasant female seen today with moderate persistent asthma  which seems to be under control at this time with biological therapy Dupixent   Asthma assessment Patient has been weaned off all maintenance inhalers and nebulizer therapy, Patient uses albuterol as needed  Avoid Allergens and Irritants Avoid secondhand smoke Avoid SICK contacts Recommend  Masking  when appropriate Recommend Keep up-to-date with vaccinations Refill Dupixent continue as prescribed Significant improvement of  symptoms of asthma   GERD (gastroesophageal reflux disease) Maintained on PPI  Loud snoring - Sleep study in September 2023 was negative for OSA, AHI 1.8/hour with SpO2 low 83% (baseline 94%)  Obesity -recommend significant weight loss -recommend changing diet Weight loss surgery several months ago patient lost 100 pounds   Deconditioned state -Recommend increased daily activity and exercise    MEDICATION ADJUSTMENTS/LABS AND TESTS ORDERED: refill Dupixent Use albuterol as needed Avoid secondhand smoke Avoid SICK contacts Recommend  Masking  when appropriate Recommend Keep up-to-date with vaccinations Continue biological therapy as this is helping tremendously   CURRENT MEDICATIONS REVIEWED AT LENGTH WITH PATIENT TODAY   Patient  satisfied with Plan of action and management. All questions answered   Follow up 6 months   I spent a total of 41 minutes reviewing chart data, face-to-face evaluation with the patient, counseling and coordination of care as detailed above.      Lucie Leather, M.D.   [facility]  Medical Director [facility] Medical Director [facility] Cardio-Pulmonary Department

## 2023-07-17 ENCOUNTER — Encounter: Payer: Medicaid Other | Admitting: Physician Assistant

## 2023-07-23 ENCOUNTER — Encounter: Payer: Self-pay | Admitting: Physician Assistant

## 2023-07-23 ENCOUNTER — Ambulatory Visit (INDEPENDENT_AMBULATORY_CARE_PROVIDER_SITE_OTHER): Admitting: Physician Assistant

## 2023-07-23 ENCOUNTER — Telehealth: Payer: Self-pay | Admitting: Physician Assistant

## 2023-07-23 VITALS — BP 129/81 | HR 82 | Ht 64.0 in | Wt 174.4 lb

## 2023-07-23 DIAGNOSIS — M793 Panniculitis, unspecified: Secondary | ICD-10-CM

## 2023-07-23 DIAGNOSIS — Z719 Counseling, unspecified: Secondary | ICD-10-CM

## 2023-07-23 NOTE — Progress Notes (Addendum)
 Patient ID: Sabrina Holder, female    DOB: 12-24-1980, 43 y.o.   MRN: [id number]  Chief Complaint  Patient presents with   Pre-op Exam      ICD-10-CM   1. Panniculitis  M79.3        History of Present Illness: Sabrina Holder is a 43 y.o.  female  with a history of panniculitis.  She presents for preoperative evaluation for upcoming procedure, panniculectomy, scheduled for 08/09/2023 with Dr.  Carolynne Citron .  The patient has not had problems with anesthesia.  Former smoker with 20-pack-year smoking history, but quit over 2 years ago.  She understands to hold her Ozempic 1 week prior to surgery.  She takes Dupixent  for asthma and reports that it is well-controlled.  She denies any personal or family history of blood clots or clotting disorder.  She also denies any personal history of cancer, significant cardiac disease, varicosities, or inflammatory bowel disease.  She has not required blood thinners.  Weight has been stable since last preoperative exam.  She states that the surgery was canceled due to insurance issues.  Summary of Previous Visit: She was seen for consult 02/06/2023. At that time, expressed interest in adding panniculectomy onto her scheduled breast reduction surgery. Complained of large overhanging pannus with infra pannus rashes. Discussed at length what a panniculectomy entailed and patient was agreeable to proceed.   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 Discussed 6 weeks FMLA.   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   Past Medical History: Allergies: No Known Allergies  Current Medications:  Current Outpatient Medications:    albuterol  (VENTOLIN  HFA) 108 (90 Base) MCG/ACT inhaler, Inhale 1-2 puffs into the lungs every 6 (six) hours as needed., Disp: 8 g, Rfl: 0   Dupilumab  (DUPIXENT ) 300 MG/2ML  SOAJ, INJECT 1 PEN UNDER THE SKIN EVERY 14 DAYS, Disp: 2 mL, Rfl: 6   escitalopram (LEXAPRO) 20 MG tablet, Take 20 mg by mouth daily. , Disp: , Rfl: 1   famotidine  (PEPCID ) 20 MG tablet, Take 1 tablet (20 mg total) by mouth at bedtime., Disp: 30 tablet, Rfl: 5   ferrous sulfate  325 (65 FE) MG tablet, Take 325 mg by mouth daily., Disp: , Rfl:    fexofenadine  (ALLEGRA) 180 MG tablet, Take 180 mg by mouth as needed., Disp: , Rfl:    montelukast  (SINGULAIR ) 10 MG tablet, Take 1 tablet (10 mg total) by mouth daily., Disp: 30 tablet, Rfl: 11   omeprazole  (PRILOSEC) 20 MG capsule, Take 20 mg by mouth daily., Disp: , Rfl:    OZEMPIC, 2 MG/DOSE, 8 MG/3ML SOPN, Inject 2 mg into the skin once a week., Disp: , Rfl:    traMADol (ULTRAM) 50 MG tablet, Take 50 mg by mouth every 6 (six) hours as needed., Disp: , Rfl:   Past Medical Problems: Past Medical History:  Diagnosis Date   Anemia    Anxiety    Asthma    Depression    Environmental allergies    GERD (gastroesophageal reflux disease)    Renal disorder     Past Surgical History: Past Surgical History:  Procedure Laterality Date   ABDOMINAL HYSTERECTOMY     BREAST REDUCTION SURGERY Bilateral 03/19/2023   Procedure: bilateral breast reduction;  Surgeon: Teretha Ferguson, MD;  Location: [facility];  Service: Plastics;  Laterality: Bilateral;   CESAREAN SECTION  X 3   COLONOSCOPY N/A 12/26/2020   Procedure: COLONOSCOPY;  Surgeon: Quintin Buckle, DO;  Location: [facility];  Service: Gastroenterology;  Laterality: N/A;   CYSTOSCOPY N/A 12/15/2019   Procedure: CYSTOSCOPY;  Surgeon: Kris Pester, MD;  Location: [facility];  Service: Gynecology;  Laterality: N/A;   ESOPHAGOGASTRODUODENOSCOPY N/A 12/26/2020   Procedure: ESOPHAGOGASTRODUODENOSCOPY (EGD);  Surgeon: Quintin Buckle, DO;  Location: [facility];  Service: Gastroenterology;  Laterality: N/A;   TOTAL LAPAROSCOPIC HYSTERECTOMY WITH SALPINGECTOMY  Bilateral 12/15/2019   Procedure: TOTAL LAPAROSCOPIC HYSTERECTOMY WITH SALPINGECTOMY;  Surgeon: Kris Pester, MD;  Location: [facility];  Service: Gynecology;  Laterality: Bilateral;   TUBAL LIGATION      Social History: Social History   Socioeconomic History   Marital status: Married    Spouse name: Not on file   Number of children: Not on file   Years of education: Not on file   Highest education level: Not on file  Occupational History   Not on file  Tobacco Use   Smoking status: Former    Current packs/day: 0.00    Average packs/day: 1 pack/day for 20.0 years (20.0 ttl pk-yrs)    Types: Cigarettes    Start date: 11/01/2001    Quit date: 11/01/2021    Years since quitting: 1.7   Smokeless tobacco: Never   Tobacco comments:    Quit in 2022 and recently started back.  Stopped again a week and a half ago on wellbutrin.  11/13/2021 hfb  Vaping Use   Vaping status: Never Used  Substance and Sexual Activity   Alcohol use: Yes    Comment: occasional (once weekly)   Drug use: No   Sexual activity: Not Currently    Birth control/protection: Surgical    Comment: Tubal ligation  Other Topics Concern   Not on file  Social History Narrative   Not on file   Social Drivers of Health   Financial Resource Strain: Not on file  Food Insecurity: Not on file  Transportation Needs: Not on file  Physical Activity: Not on file  Stress: Not on file  Social Connections: Not on file  Intimate Partner Violence: Not on file    Family History: Family History  Problem Relation Age of Onset   Asthma Mother    Gout Father    Cancer Maternal Grandmother 40       unknown type   Uterine cancer Neg Hx    Ovarian cancer Neg Hx    Breast cancer Neg Hx     Review of Systems: ROS She denies any chest pain, difficulty breathing, leg swelling, fevers, or recent trauma/infection.  Physical Exam: Vital Signs BP 129/81 (BP Location: Right Arm, Patient Position: Sitting, Cuff Size: Large)    Pulse 82   Ht 5\' 4"  (1.626 m)   Wt 174 lb 6.4 oz (79.1 kg)   LMP 10/20/2019 (Approximate)   SpO2 99%   BMI 29.94 kg/m   Physical Exam Constitutional:      General: Not in acute distress.    Appearance: Normal appearance. Not ill-appearing.  HENT:     Head: Normocephalic and atraumatic.  Eyes:     Pupils: Pupils are equal, round. Cardiovascular:     Rate and Rhythm: Normal rate.    Pulses: Normal pulses.  Pulmonary:     Effort: No respiratory distress or increased work of breathing.  Speaks in full sentences. Abdominal:     General: Abdomen is flat. No distension.   Musculoskeletal: Normal  range of motion. No lower extremity swelling or edema. No varicosities. Skin:    General: Skin is warm and dry.     Findings: No erythema or rash.  Neurological:     Mental Status: Alert and oriented to person, place, and time.  Psychiatric:        Mood and Affect: Mood normal.        Behavior: Behavior normal.    Assessment/Plan: The patient is scheduled for panniculectomy with Dr.  Carolynne Citron .  Risks, benefits, and alternatives of procedure discussed, questions answered and consent obtained.     Smoking Status: Non-smoker.   Caprini Score: 4; Risk Factors include: Age, BMI greater than 25, and length of planned surgery. Recommendation for mechanical prophylaxis. Encourage early ambulation.   Pictures obtained: 02/06/2023  Post-op Rx sent to pharmacy: Oxycodone  and Zofran  previously prescribed and per PDMP filled 05/25/2023.  However, patient reports that she does not believe that it was filled or available for whatever reason.  This is often the case when the fill date is much later than the prescription date.  Will hold off on prescribing any additional narcotics at this time and have her check her supplies at home, but may need to prescribe a few oxycodone  on day of surgery.  Otherwise, she can plan to take tramadol given that she still has some at home.  Patient was provided with the  General Surgical Risk consent document and Pain Medication Agreement prior to their appointment.  They had adequate time to read through the risk consent documents and Pain Medication Agreement. We also discussed them in person together during this preop appointment. All of their questions were answered to their satisfaction.  Recommended calling if they have any further questions.  Risk consent form and Pain Medication Agreement to be scanned into patient's chart.  The risk that can be encountered for this procedure were discussed and include the following but not limited to these: asymmetry, fluid accumulation, firmness of the tissue, skin loss, decrease or no sensation, fat necrosis, bleeding, infection, healing delay.  Deep vein thrombosis, cardiac and pulmonary complications are risks to any procedure.  There are risks of anesthesia, changes to skin sensation and injury to nerves or blood vessels.  The muscle can be temporarily or permanently injured.  You may have an allergic reaction to tape, suture, glue, blood products which can result in skin discoloration, swelling, pain, skin lesions, poor healing.  Any of these can lead to the need for revisonal surgery or stage procedures.  Weight gain and weigh loss can also effect the long term appearance. The results are not guaranteed to last a lifetime.  Future surgery may be required.      Electronically signed by: Mariel Shope, PA-C 07/23/2023 9:32 AM

## 2023-07-23 NOTE — H&P (View-Only) (Signed)
 Patient ID: Sabrina Holder, female    DOB: 12-24-1980, 43 y.o.   MRN: [id number]  Chief Complaint  Patient presents with   Pre-op Exam      ICD-10-CM   1. Panniculitis  M79.3        History of Present Illness: Sabrina Holder is a 43 y.o.  female  with a history of panniculitis.  She presents for preoperative evaluation for upcoming procedure, panniculectomy, scheduled for 08/09/2023 with Dr.  Carolynne Citron .  The patient has not had problems with anesthesia.  Former smoker with 20-pack-year smoking history, but quit over 2 years ago.  She understands to hold her Ozempic 1 week prior to surgery.  She takes Dupixent  for asthma and reports that it is well-controlled.  She denies any personal or family history of blood clots or clotting disorder.  She also denies any personal history of cancer, significant cardiac disease, varicosities, or inflammatory bowel disease.  She has not required blood thinners.  Weight has been stable since last preoperative exam.  She states that the surgery was canceled due to insurance issues.  Summary of Previous Visit: She was seen for consult 02/06/2023. At that time, expressed interest in adding panniculectomy onto her scheduled breast reduction surgery. Complained of large overhanging pannus with infra pannus rashes. Discussed at length what a panniculectomy entailed and patient was agreeable to proceed.   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 Discussed 6 weeks FMLA.   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   Past Medical History: Allergies: No Known Allergies  Current Medications:  Current Outpatient Medications:    albuterol  (VENTOLIN  HFA) 108 (90 Base) MCG/ACT inhaler, Inhale 1-2 puffs into the lungs every 6 (six) hours as needed., Disp: 8 g, Rfl: 0   Dupilumab  (DUPIXENT ) 300 MG/2ML  SOAJ, INJECT 1 PEN UNDER THE SKIN EVERY 14 DAYS, Disp: 2 mL, Rfl: 6   escitalopram (LEXAPRO) 20 MG tablet, Take 20 mg by mouth daily. , Disp: , Rfl: 1   famotidine  (PEPCID ) 20 MG tablet, Take 1 tablet (20 mg total) by mouth at bedtime., Disp: 30 tablet, Rfl: 5   ferrous sulfate  325 (65 FE) MG tablet, Take 325 mg by mouth daily., Disp: , Rfl:    fexofenadine  (ALLEGRA) 180 MG tablet, Take 180 mg by mouth as needed., Disp: , Rfl:    montelukast  (SINGULAIR ) 10 MG tablet, Take 1 tablet (10 mg total) by mouth daily., Disp: 30 tablet, Rfl: 11   omeprazole  (PRILOSEC) 20 MG capsule, Take 20 mg by mouth daily., Disp: , Rfl:    OZEMPIC, 2 MG/DOSE, 8 MG/3ML SOPN, Inject 2 mg into the skin once a week., Disp: , Rfl:    traMADol (ULTRAM) 50 MG tablet, Take 50 mg by mouth every 6 (six) hours as needed., Disp: , Rfl:   Past Medical Problems: Past Medical History:  Diagnosis Date   Anemia    Anxiety    Asthma    Depression    Environmental allergies    GERD (gastroesophageal reflux disease)    Renal disorder     Past Surgical History: Past Surgical History:  Procedure Laterality Date   ABDOMINAL HYSTERECTOMY     BREAST REDUCTION SURGERY Bilateral 03/19/2023   Procedure: bilateral breast reduction;  Surgeon: Teretha Ferguson, MD;  Location: [facility];  Service: Plastics;  Laterality: Bilateral;   CESAREAN SECTION  X 3   COLONOSCOPY N/A 12/26/2020   Procedure: COLONOSCOPY;  Surgeon: Quintin Buckle, DO;  Location: [facility];  Service: Gastroenterology;  Laterality: N/A;   CYSTOSCOPY N/A 12/15/2019   Procedure: CYSTOSCOPY;  Surgeon: Kris Pester, MD;  Location: [facility];  Service: Gynecology;  Laterality: N/A;   ESOPHAGOGASTRODUODENOSCOPY N/A 12/26/2020   Procedure: ESOPHAGOGASTRODUODENOSCOPY (EGD);  Surgeon: Quintin Buckle, DO;  Location: [facility];  Service: Gastroenterology;  Laterality: N/A;   TOTAL LAPAROSCOPIC HYSTERECTOMY WITH SALPINGECTOMY  Bilateral 12/15/2019   Procedure: TOTAL LAPAROSCOPIC HYSTERECTOMY WITH SALPINGECTOMY;  Surgeon: Kris Pester, MD;  Location: [facility];  Service: Gynecology;  Laterality: Bilateral;   TUBAL LIGATION      Social History: Social History   Socioeconomic History   Marital status: Married    Spouse name: Not on file   Number of children: Not on file   Years of education: Not on file   Highest education level: Not on file  Occupational History   Not on file  Tobacco Use   Smoking status: Former    Current packs/day: 0.00    Average packs/day: 1 pack/day for 20.0 years (20.0 ttl pk-yrs)    Types: Cigarettes    Start date: 11/01/2001    Quit date: 11/01/2021    Years since quitting: 1.7   Smokeless tobacco: Never   Tobacco comments:    Quit in 2022 and recently started back.  Stopped again a week and a half ago on wellbutrin.  11/13/2021 hfb  Vaping Use   Vaping status: Never Used  Substance and Sexual Activity   Alcohol use: Yes    Comment: occasional (once weekly)   Drug use: No   Sexual activity: Not Currently    Birth control/protection: Surgical    Comment: Tubal ligation  Other Topics Concern   Not on file  Social History Narrative   Not on file   Social Drivers of Health   Financial Resource Strain: Not on file  Food Insecurity: Not on file  Transportation Needs: Not on file  Physical Activity: Not on file  Stress: Not on file  Social Connections: Not on file  Intimate Partner Violence: Not on file    Family History: Family History  Problem Relation Age of Onset   Asthma Mother    Gout Father    Cancer Maternal Grandmother 40       unknown type   Uterine cancer Neg Hx    Ovarian cancer Neg Hx    Breast cancer Neg Hx     Review of Systems: ROS She denies any chest pain, difficulty breathing, leg swelling, fevers, or recent trauma/infection.  Physical Exam: Vital Signs BP 129/81 (BP Location: Right Arm, Patient Position: Sitting, Cuff Size: Large)    Pulse 82   Ht 5\' 4"  (1.626 m)   Wt 174 lb 6.4 oz (79.1 kg)   LMP 10/20/2019 (Approximate)   SpO2 99%   BMI 29.94 kg/m   Physical Exam Constitutional:      General: Not in acute distress.    Appearance: Normal appearance. Not ill-appearing.  HENT:     Head: Normocephalic and atraumatic.  Eyes:     Pupils: Pupils are equal, round. Cardiovascular:     Rate and Rhythm: Normal rate.    Pulses: Normal pulses.  Pulmonary:     Effort: No respiratory distress or increased work of breathing.  Speaks in full sentences. Abdominal:     General: Abdomen is flat. No distension.   Musculoskeletal: Normal  range of motion. No lower extremity swelling or edema. No varicosities. Skin:    General: Skin is warm and dry.     Findings: No erythema or rash.  Neurological:     Mental Status: Alert and oriented to person, place, and time.  Psychiatric:        Mood and Affect: Mood normal.        Behavior: Behavior normal.    Assessment/Plan: The patient is scheduled for panniculectomy with Dr.  Carolynne Citron .  Risks, benefits, and alternatives of procedure discussed, questions answered and consent obtained.     Smoking Status: Non-smoker.   Caprini Score: 4; Risk Factors include: Age, BMI greater than 25, and length of planned surgery. Recommendation for mechanical prophylaxis. Encourage early ambulation.   Pictures obtained: 02/06/2023  Post-op Rx sent to pharmacy: Oxycodone  and Zofran  previously prescribed and per PDMP filled 05/25/2023.  However, patient reports that she does not believe that it was filled or available for whatever reason.  This is often the case when the fill date is much later than the prescription date.  Will hold off on prescribing any additional narcotics at this time and have her check her supplies at home, but may need to prescribe a few oxycodone  on day of surgery.  Otherwise, she can plan to take tramadol given that she still has some at home.  Patient was provided with the  General Surgical Risk consent document and Pain Medication Agreement prior to their appointment.  They had adequate time to read through the risk consent documents and Pain Medication Agreement. We also discussed them in person together during this preop appointment. All of their questions were answered to their satisfaction.  Recommended calling if they have any further questions.  Risk consent form and Pain Medication Agreement to be scanned into patient's chart.  The risk that can be encountered for this procedure were discussed and include the following but not limited to these: asymmetry, fluid accumulation, firmness of the tissue, skin loss, decrease or no sensation, fat necrosis, bleeding, infection, healing delay.  Deep vein thrombosis, cardiac and pulmonary complications are risks to any procedure.  There are risks of anesthesia, changes to skin sensation and injury to nerves or blood vessels.  The muscle can be temporarily or permanently injured.  You may have an allergic reaction to tape, suture, glue, blood products which can result in skin discoloration, swelling, pain, skin lesions, poor healing.  Any of these can lead to the need for revisonal surgery or stage procedures.  Weight gain and weigh loss can also effect the long term appearance. The results are not guaranteed to last a lifetime.  Future surgery may be required.      Electronically signed by: Mariel Shope, PA-C 07/23/2023 9:32 AM

## 2023-07-23 NOTE — Telephone Encounter (Signed)
 Patient dropped off FMLA form to be filled out and asked for a copy she can pick up after its faxed. I placed with pre-op provider

## 2023-08-02 ENCOUNTER — Other Ambulatory Visit: Payer: Self-pay

## 2023-08-02 ENCOUNTER — Encounter (HOSPITAL_BASED_OUTPATIENT_CLINIC_OR_DEPARTMENT_OTHER): Payer: Self-pay | Admitting: Plastic Surgery

## 2023-08-05 ENCOUNTER — Encounter: Payer: Managed Care, Other (non HMO) | Admitting: Physician Assistant

## 2023-08-09 ENCOUNTER — Other Ambulatory Visit: Payer: Self-pay

## 2023-08-09 ENCOUNTER — Ambulatory Visit (HOSPITAL_BASED_OUTPATIENT_CLINIC_OR_DEPARTMENT_OTHER): Admitting: Anesthesiology

## 2023-08-09 ENCOUNTER — Encounter (HOSPITAL_BASED_OUTPATIENT_CLINIC_OR_DEPARTMENT_OTHER): Payer: Self-pay | Admitting: Plastic Surgery

## 2023-08-09 ENCOUNTER — Ambulatory Visit (HOSPITAL_BASED_OUTPATIENT_CLINIC_OR_DEPARTMENT_OTHER)
Admission: RE | Admit: 2023-08-09 | Discharge: 2023-08-09 | Disposition: A | Discharge status: Home / Self Care | Attending: Plastic Surgery | Admitting: Plastic Surgery

## 2023-08-09 ENCOUNTER — Encounter (HOSPITAL_BASED_OUTPATIENT_CLINIC_OR_DEPARTMENT_OTHER)
Admission: RE | Disposition: A | Discharge status: Home / Self Care | Payer: Self-pay | Source: Home / Self Care | Attending: Plastic Surgery

## 2023-08-09 DIAGNOSIS — M793 Panniculitis, unspecified: Secondary | ICD-10-CM

## 2023-08-09 DIAGNOSIS — Z87891 Personal history of nicotine dependence: Secondary | ICD-10-CM | POA: Insufficient documentation

## 2023-08-09 DIAGNOSIS — J4489 Other specified chronic obstructive pulmonary disease: Secondary | ICD-10-CM | POA: Diagnosis not present

## 2023-08-09 DIAGNOSIS — K219 Gastro-esophageal reflux disease without esophagitis: Secondary | ICD-10-CM | POA: Insufficient documentation

## 2023-08-09 DIAGNOSIS — Z79899 Other long term (current) drug therapy: Secondary | ICD-10-CM | POA: Insufficient documentation

## 2023-08-09 HISTORY — PX: PANNICULECTOMY: SHX5360

## 2023-08-09 SURGERY — PANNICULECTOMY
Anesthesia: General

## 2023-08-09 MED ORDER — ROCURONIUM BROMIDE 10 MG/ML (PF) SYRINGE
PREFILLED_SYRINGE | INTRAVENOUS | Status: DC | PRN
  Administered 2023-08-09: 50 mg via INTRAVENOUS

## 2023-08-09 MED ORDER — SCOPOLAMINE 1 MG/3DAYS TD PT72
1.0000 | MEDICATED_PATCH | TRANSDERMAL | Status: DC
  Administered 2023-08-09: 1.5 mg via TRANSDERMAL

## 2023-08-09 MED ORDER — CEFAZOLIN SODIUM-DEXTROSE 2-4 GM/100ML-% IV SOLN
INTRAVENOUS | Status: AC
Start: 2023-08-09 — End: ?
  Filled 2023-08-09: qty 100

## 2023-08-09 MED ORDER — OXYCODONE HCL 5 MG PO TABS: 5.0000 mg | ORAL_TABLET | Freq: Four times a day (QID) | ORAL | 0 refills | Status: AC | PRN

## 2023-08-09 MED ORDER — PROPOFOL 10 MG/ML IV BOLUS
INTRAVENOUS | Status: DC | PRN
  Administered 2023-08-09: 200 mg via INTRAVENOUS

## 2023-08-09 MED ORDER — LIDOCAINE 2% (20 MG/ML) 5 ML SYRINGE
INTRAMUSCULAR | Status: DC | PRN
  Administered 2023-08-09: 40 mg via INTRAVENOUS

## 2023-08-09 MED ORDER — LIDOCAINE 2% (20 MG/ML) 5 ML SYRINGE
INTRAMUSCULAR | Status: AC
  Filled 2023-08-09: qty 5

## 2023-08-09 MED ORDER — ACETAMINOPHEN 500 MG PO TABS
ORAL_TABLET | ORAL | Status: AC
  Filled 2023-08-09: qty 2

## 2023-08-09 MED ORDER — CHLORHEXIDINE GLUCONATE CLOTH 2 % EX PADS: 6.0000 | MEDICATED_PAD | Freq: Once | CUTANEOUS | Status: DC

## 2023-08-09 MED ORDER — ACETAMINOPHEN 500 MG PO TABS
1000.0000 mg | ORAL_TABLET | Freq: Once | ORAL | Status: AC
  Administered 2023-08-09: 1000 mg via ORAL

## 2023-08-09 MED ORDER — MEPERIDINE HCL 25 MG/ML IJ SOLN: 6.2500 mg | INTRAMUSCULAR | Status: DC | PRN

## 2023-08-09 MED ORDER — OXYCODONE HCL 5 MG/5ML PO SOLN: 5.0000 mg | Freq: Once | ORAL | Status: DC | PRN

## 2023-08-09 MED ORDER — MIDAZOLAM HCL 2 MG/2ML IJ SOLN
INTRAMUSCULAR | Status: DC | PRN
  Administered 2023-08-09: 2 mg via INTRAVENOUS

## 2023-08-09 MED ORDER — DEXAMETHASONE SODIUM PHOSPHATE 10 MG/ML IJ SOLN
INTRAMUSCULAR | Status: DC | PRN
  Administered 2023-08-09: 5 mg via INTRAVENOUS

## 2023-08-09 MED ORDER — ONDANSETRON HCL 4 MG/2ML IJ SOLN
INTRAMUSCULAR | Status: AC
  Filled 2023-08-09: qty 2

## 2023-08-09 MED ORDER — CEFAZOLIN SODIUM-DEXTROSE 2-4 GM/100ML-% IV SOLN
2.0000 g | INTRAVENOUS | Status: AC
  Administered 2023-08-09: 2 g via INTRAVENOUS

## 2023-08-09 MED ORDER — LACTATED RINGERS IV SOLN: INTRAVENOUS | Status: DC

## 2023-08-09 MED ORDER — MIDAZOLAM HCL 2 MG/2ML IJ SOLN: 0.5000 mg | Freq: Once | INTRAMUSCULAR | Status: DC | PRN

## 2023-08-09 MED ORDER — PHENYLEPHRINE 80 MCG/ML (10ML) SYRINGE FOR IV PUSH (FOR BLOOD PRESSURE SUPPORT)
PREFILLED_SYRINGE | INTRAVENOUS | Status: AC
  Filled 2023-08-09: qty 10

## 2023-08-09 MED ORDER — SUGAMMADEX SODIUM 200 MG/2ML IV SOLN
INTRAVENOUS | Status: DC | PRN
  Administered 2023-08-09: 200 mg via INTRAVENOUS

## 2023-08-09 MED ORDER — FENTANYL CITRATE (PF) 100 MCG/2ML IJ SOLN
INTRAMUSCULAR | Status: DC | PRN
  Administered 2023-08-09 (×2): 50 ug via INTRAVENOUS

## 2023-08-09 MED ORDER — SCOPOLAMINE 1 MG/3DAYS TD PT72
MEDICATED_PATCH | TRANSDERMAL | Status: AC
  Filled 2023-08-09: qty 1

## 2023-08-09 MED ORDER — OXYCODONE HCL 5 MG PO TABS: 5.0000 mg | ORAL_TABLET | Freq: Once | ORAL | Status: DC | PRN

## 2023-08-09 MED ORDER — PHENYLEPHRINE 80 MCG/ML (10ML) SYRINGE FOR IV PUSH (FOR BLOOD PRESSURE SUPPORT)
PREFILLED_SYRINGE | INTRAVENOUS | Status: DC | PRN
  Administered 2023-08-09 (×2): 80 ug via INTRAVENOUS

## 2023-08-09 MED ORDER — HYDROMORPHONE HCL 1 MG/ML IJ SOLN: 0.2500 mg | INTRAMUSCULAR | Status: DC | PRN

## 2023-08-09 MED ORDER — ONDANSETRON 4 MG PO TBDP: 4.0000 mg | ORAL_TABLET | Freq: Three times a day (TID) | ORAL | 0 refills | Status: AC | PRN

## 2023-08-09 MED ORDER — 0.9 % SODIUM CHLORIDE (POUR BTL) OPTIME
TOPICAL | Status: DC | PRN
  Administered 2023-08-09: 2000 mL

## 2023-08-09 MED ORDER — DEXAMETHASONE SODIUM PHOSPHATE 10 MG/ML IJ SOLN
INTRAMUSCULAR | Status: AC
  Filled 2023-08-09: qty 1

## 2023-08-09 MED ORDER — ATROPINE SULFATE 0.4 MG/ML IV SOLN
INTRAVENOUS | Status: AC
  Filled 2023-08-09: qty 1

## 2023-08-09 MED ORDER — ONDANSETRON HCL 4 MG/2ML IJ SOLN
INTRAMUSCULAR | Status: DC | PRN
  Administered 2023-08-09: 4 mg via INTRAVENOUS

## 2023-08-09 MED ORDER — ROCURONIUM BROMIDE 10 MG/ML (PF) SYRINGE
PREFILLED_SYRINGE | INTRAVENOUS | Status: AC
  Filled 2023-08-09: qty 10

## 2023-08-09 MED ORDER — MIDAZOLAM HCL 2 MG/2ML IJ SOLN
INTRAMUSCULAR | Status: AC
  Filled 2023-08-09: qty 2

## 2023-08-09 MED ORDER — SODIUM CHLORIDE (PF) 0.9 % IJ SOLN
INTRAMUSCULAR | Status: DC | PRN
  Administered 2023-08-09: 100 mL

## 2023-08-09 MED ORDER — SUCCINYLCHOLINE CHLORIDE 200 MG/10ML IV SOSY
PREFILLED_SYRINGE | INTRAVENOUS | Status: AC
  Filled 2023-08-09: qty 10

## 2023-08-09 MED ORDER — FENTANYL CITRATE (PF) 100 MCG/2ML IJ SOLN
INTRAMUSCULAR | Status: AC
  Filled 2023-08-09: qty 2

## 2023-08-09 MED ORDER — BUPIVACAINE LIPOSOME 1.3 % IJ SUSP
INTRAMUSCULAR | Status: AC
  Filled 2023-08-09: qty 20

## 2023-08-09 SURGICAL SUPPLY — 54 items
BINDER ABDOMINAL 12 ML 46-62 (SOFTGOODS) IMPLANT
BINDER ABDOMINAL 12 SM 30-45 (SOFTGOODS) IMPLANT
BIOPATCH RED 1 DISK 7.0 (GAUZE/BANDAGES/DRESSINGS) ×2 IMPLANT
BLADE CLIPPER SURG (BLADE) IMPLANT
BLADE SURG 10 STRL SS (BLADE) ×4 IMPLANT
BLADE SURG 11 STRL SS (BLADE) IMPLANT
BLADE SURG 15 STRL LF DISP TIS (BLADE) ×1 IMPLANT
CANISTER SUCT 1200ML W/VALVE (MISCELLANEOUS) ×1 IMPLANT
CLIP APPLIE 9.375 MED OPEN (MISCELLANEOUS) IMPLANT
DERMABOND ADVANCED .7 DNX12 (GAUZE/BANDAGES/DRESSINGS) ×2 IMPLANT
DRAIN CHANNEL 19F RND (DRAIN) ×2 IMPLANT
DRAPE UTILITY XL STRL (DRAPES) ×1 IMPLANT
DRSG TEGADERM 4X4.75 (GAUZE/BANDAGES/DRESSINGS) ×2 IMPLANT
ELECT COATED BLADE 2.86 ST (ELECTRODE) IMPLANT
ELECTRODE BLDE 4.0 EZ CLN MEGD (MISCELLANEOUS) ×1 IMPLANT
ELECTRODE REM PT RTRN 9FT ADLT (ELECTROSURGICAL) ×2 IMPLANT
EVACUATOR SILICONE 100CC (DRAIN) ×2 IMPLANT
GAUZE PAD ABD 8X10 STRL (GAUZE/BANDAGES/DRESSINGS) ×2 IMPLANT
GAUZE SPONGE 2X2 STRL 8-PLY (GAUZE/BANDAGES/DRESSINGS) ×2 IMPLANT
GLOVE BIO SURGEON STRL SZ 6.5 (GLOVE) IMPLANT
GLOVE BIO SURGEON STRL SZ7.5 (GLOVE) ×1 IMPLANT
GLOVE BIO SURGEON STRL SZ8 (GLOVE) ×2 IMPLANT
GLOVE BIOGEL PI IND STRL 7.0 (GLOVE) IMPLANT
GLOVE BIOGEL PI IND STRL 8 (GLOVE) ×2 IMPLANT
GOWN STRL REUS W/ TWL LRG LVL3 (GOWN DISPOSABLE) ×1 IMPLANT
GOWN STRL REUS W/TWL XL LVL3 (GOWN DISPOSABLE) ×2 IMPLANT
HEMOSTAT ARISTA ABSORB 3G PWDR (HEMOSTASIS) IMPLANT
HIBICLENS CHG 4% 4OZ BTL (MISCELLANEOUS) ×1 IMPLANT
NDL HYPO 22X1.5 SAFETY MO (MISCELLANEOUS) ×2 IMPLANT
NEEDLE HYPO 22X1.5 SAFETY MO (MISCELLANEOUS) ×2 IMPLANT
NS IRRIG 1000ML POUR BTL (IV SOLUTION) ×1 IMPLANT
PACK BASIN DAY SURGERY FS (CUSTOM PROCEDURE TRAY) ×1 IMPLANT
PACK UNIVERSAL I (CUSTOM PROCEDURE TRAY) ×1 IMPLANT
PENCIL SMOKE EVACUATOR (MISCELLANEOUS) ×2 IMPLANT
PIN SAFETY STERILE (MISCELLANEOUS) ×1 IMPLANT
SLEEVE SCD COMPRESS KNEE MED (STOCKING) ×1 IMPLANT
SPONGE T-LAP 18X18 ~~LOC~~+RFID (SPONGE) ×2 IMPLANT
STAPLER INSORB 30 2030 C-SECTI (MISCELLANEOUS) IMPLANT
STAPLER SKIN PROX WIDE 3.9 (STAPLE) ×1 IMPLANT
SUT MNCRL AB 3-0 PS2 27 (SUTURE) ×4 IMPLANT
SUT MNCRL AB 4-0 PS2 18 (SUTURE) ×4 IMPLANT
SUT SILK 2 0 PERMA HAND 18 BK (SUTURE) ×2 IMPLANT
SUT VIC AB 2-0 CT1 TAPERPNT 27 (SUTURE) ×2 IMPLANT
SUT VIC AB 3-0 PS1 18XBRD (SUTURE) IMPLANT
SUT VIC AB 3-0 SH 27X BRD (SUTURE) ×1 IMPLANT
SUT VICRYL AB 3 0 TIES (SUTURE) IMPLANT
SYR 20ML LL LF (SYRINGE) ×2 IMPLANT
SYR BULB IRRIG 60ML STRL (SYRINGE) ×1 IMPLANT
SYR CONTROL 10ML LL (SYRINGE) ×1 IMPLANT
TOWEL GREEN STERILE FF (TOWEL DISPOSABLE) ×2 IMPLANT
TRAY DSU PREP LF (CUSTOM PROCEDURE TRAY) ×1 IMPLANT
TUBE CONNECTING 20X1/4 (TUBING) ×1 IMPLANT
UNDERPAD 30X36 HEAVY ABSORB (UNDERPADS AND DIAPERS) ×2 IMPLANT
YANKAUER SUCT BULB TIP NO VENT (SUCTIONS) ×1 IMPLANT

## 2023-08-09 NOTE — Anesthesia Preprocedure Evaluation (Addendum)
 Anesthesia Evaluation  Patient identified by MRN, date of birth, ID band Patient awake    Reviewed: Allergy & Precautions, NPO status , Patient's Chart, lab work & pertinent test results  History of Anesthesia Complications Negative for: history of anesthetic complications  Airway Mallampati: I  TM Distance: >3 FB Neck ROM: Full    Dental  (+) Dental Advisory Given   Pulmonary asthma , COPD,  COPD inhaler, former smoker   breath sounds clear to auscultation       Cardiovascular negative cardio ROS  Rhythm:Regular Rate:Normal     Neuro/Psych   Anxiety Depression    negative neurological ROS     GI/Hepatic Neg liver ROS,GERD  Medicated and Controlled,,  Endo/Other  Ozempic: 10d ago  Renal/GU negative Renal ROS     Musculoskeletal   Abdominal   Peds  Hematology negative hematology ROS (+)   Anesthesia Other Findings   Reproductive/Obstetrics                             Anesthesia Physical Anesthesia Plan  ASA: 2  Anesthesia Plan: General   Post-op Pain Management: Tylenol  PO (pre-op)*   Induction: Intravenous  PONV Risk Score and Plan: 3 and Ondansetron , Dexamethasone  and Scopolamine  patch - Pre-op  Airway Management Planned: Oral ETT  Additional Equipment: None  Intra-op Plan:   Post-operative Plan: Extubation in OR  Informed Consent: I have reviewed the patients History and Physical, chart, labs and discussed the procedure including the risks, benefits and alternatives for the proposed anesthesia with the patient or authorized representative who has indicated his/her understanding and acceptance.     Dental advisory given  Plan Discussed with: CRNA and Surgeon  Anesthesia Plan Comments:        Anesthesia Quick Evaluation

## 2023-08-09 NOTE — Anesthesia Postprocedure Evaluation (Signed)
 Anesthesia Post Note  Patient: Sabrina Holder  Procedure(s) Performed: PANNICULECTOMY     Patient location during evaluation: Phase II Anesthesia Type: General Level of consciousness: awake and alert, patient cooperative and oriented Pain management: pain level controlled Vital Signs Assessment: post-procedure vital signs reviewed and stable Respiratory status: spontaneous breathing, nonlabored ventilation and respiratory function stable Cardiovascular status: blood pressure returned to baseline and stable Postop Assessment: no apparent nausea or vomiting and able to ambulate Anesthetic complications: no   No notable events documented.  Last Vitals:  Vitals:   08/09/23 1415 08/09/23 1430  BP: 132/81 116/74  Pulse: 89 80  Resp: 16 16  Temp: (!) 36.2 C   SpO2: 100% 94%    Last Pain:  Vitals:   08/09/23 1430  TempSrc:   PainSc: 0-No pain                 Hoa Deriso,E. Owais Pruett

## 2023-08-09 NOTE — Op Note (Signed)
 DATE OF OPERATION: 08/09/2023  LOCATION: [facility] operating Room  PREOPERATIVE DIAGNOSIS: Panniculitis  POSTOPERATIVE DIAGNOSIS: Same  PROCEDURE: Panniculectomy  SURGEON: Kurtis Philips, MD  ASSISTANT: Mariel Shope  EBL: 50 cc  CONDITION: Stable  COMPLICATIONS: None  INDICATION: The patient, Sabrina Holder, is a 43 y.o. female born on April 12, 1980, is here for treatment ongoing rash on posterior aspect of the pannus.   PROCEDURE DETAILS:  The patient was seen prior to surgery and marked.  The IV antibiotics were given. The patient was taken to the operating room and given a general anesthetic. A standard time out was performed and all information was confirmed by those in the room. SCDs were placed.   The abdomen is prepped and draped in usual sterile manner.  A low transverse incision was made across the abdomen sharply and the electrocautery was used to dissected the skin and fat to the anterior abdominal wall.  The skin of fat was dissected away from the anterior abdominal wall in a caudad to cranial manner to the level of the umbilicus.  The point for the upper incision was identified and the incision was made sharply removing the pannus.  The wound was irrigated with warm normal saline and a mixture of Exparel , quarter percent plain Marcaine , saline was injected in the subfascial space over the rectus muscles and the external oblique as well as the subcutaneous tissues.  100 mL of this mixture was used.  After ensuring hemostasis 219 French round drains were placed in the wound and brought out through separate stab incisions.  Skin edges were tailor tacked in place with skin clips.  The Scarpa's fascia was approximated with interrupted 2-0 Vicryl sutures.  The dermis was closed with interrupted running 3-0 Monocryl sutures and skin was closed with a running 4-0 Monocryl septically stitch.  The incision was sealed with Dermabond.  Patient was placed in a compressive garment awakened  from anesthesia without incident.  She was transferred to the recovery room in good condition.  All instrument needle and sponge counts were reported as correct and no complications were appreciated. The patient was allowed to wake up and taken to recovery room in stable condition at the end of the case. The family was notified at the end of the case.   The advanced practice practitioner (APP) assisted throughout the case.  The APP was essential in retraction and counter traction when needed to make the case progress smoothly.  This retraction and assistance made it possible to see the tissue plans for the procedure.  The assistance was needed for blood control, tissue re-approximation and assisted with closure of the incision site.

## 2023-08-09 NOTE — Anesthesia Procedure Notes (Signed)
 Procedure Name: Intubation Date/Time: 08/09/2023 12:20 PM  Performed by: Glo Larch, CRNAPre-anesthesia Checklist: Patient identified, Emergency Drugs available, Suction available and Patient being monitored Patient Re-evaluated:Patient Re-evaluated prior to induction Oxygen  Delivery Method: Circle system utilized Preoxygenation: Pre-oxygenation with 100% oxygen  Induction Type: IV induction Ventilation: Mask ventilation without difficulty Laryngoscope Size: Mac and 3 Grade View: Grade I Tube type: Oral Tube size: 7.0 mm Number of attempts: 1 Airway Equipment and Method: Stylet and Oral airway Placement Confirmation: ETT inserted through vocal cords under direct vision, positive ETCO2 and breath sounds checked- equal and bilateral Secured at: 22 cm Tube secured with: Tape Dental Injury: Teeth and Oropharynx as per pre-operative assessment

## 2023-08-09 NOTE — Discharge Instructions (Addendum)
 Activity As tolerated: NO showers until 3 days after surgery. Keep binder on 24/7 unless showering or changing dressings. This is important to prevent additional swelling.  NO driving while in pain, taking narcotic pain medication, or if you are unable to safely react to traffic. No heavy activities. No lifting > 15 pounds. No abdominal or core exercise until incision is well-healed.  Recommend that you sleep with a slight flex at your hips. You can place a pillow underneath your knees as well as behind your back to take pressure off of the incision.  Please take Tylenol  500 mg every 6 hours for pain control. If you can take ibuprofen  (NSAIDs), please take them additionally, as per the instructions on the bottle.  Take the oxycodone  only as needed for severe pain.   Diet: Regular. Drink plenty of fluids (water, avoid juice/soda) and eat healthy, high protein, low carbs.  Wound Care: Keep dressing clean & dry. You may change bandages after showering if you continue to notice some drainage.  Special Instructions: Call Doctor if any unusual problems occur such as pain, excessive Bleeding, unrelieved Nausea/vomiting, Fever &/or chills  Drains: Measure drain output from drains every 24 hours. Record this on a log for us  to view during post-op appointments. Drainage will change colors over the next week to two weeks. This is normal. Color of drainage can vary from red-pink-orange-yellow.  Follow-up appointment: As scheduled.  No Tylenol  before 4pm today.   Post Anesthesia Home Care Instructions  Activity: Get plenty of rest for the remainder of the day. A responsible individual must stay with you for 24 hours following the procedure.  For the next 24 hours, DO NOT: -Drive a car -Advertising copywriter -Drink alcoholic beverages -Take any medication unless instructed by your physician -Make any legal decisions or sign important papers.  Meals: Start with liquid foods such as gelatin or soup.  Progress to regular foods as tolerated. Avoid greasy, spicy, heavy foods. If nausea and/or vomiting occur, drink only clear liquids until the nausea and/or vomiting subsides. Call your physician if vomiting continues.  Special Instructions/Symptoms: Your throat may feel dry or sore from the anesthesia or the breathing tube placed in your throat during surgery. If this causes discomfort, gargle with warm salt water. The discomfort should disappear within 24 hours.  If you had a scopolamine  patch placed behind your ear for the management of post- operative nausea and/or vomiting:  1. The medication in the patch is effective for 72 hours, after which it should be removed.  Wrap patch in a tissue and discard in the trash. Wash hands thoroughly with soap and water. 2. You may remove the patch earlier than 72 hours if you experience unpleasant side effects which may include dry mouth, dizziness or visual disturbances. 3. Avoid touching the patch. Wash your hands with soap and water after contact with the patch.  Information for Discharge Teaching: EXPAREL  (bupivacaine  liposome injectable suspension)   Pain relief is important to your recovery. The goal is to control your pain so you can move easier and return to your normal activities as soon as possible after your procedure. Your physician may use several types of medicines to manage pain, swelling, and more.  Your surgeon or anesthesiologist gave you EXPAREL (bupivacaine ) to help control your pain after surgery.  EXPAREL  is a local anesthetic designed to release slowly over an extended period of time to provide pain relief by numbing the tissue around the surgical site. EXPAREL  is designed to release pain  medication over time and can control pain for up to 72 hours. Depending on how you respond to EXPAREL , you may require less pain medication during your recovery. EXPAREL  can help reduce or eliminate the need for opioids during the first few days  after surgery when pain relief is needed the most. EXPAREL  is not an opioid and is not addictive. It does not cause sleepiness or sedation.   Important! A teal colored band has been placed on your arm with the date, time and amount of EXPAREL  you have received. Please leave this armband in place for the full 96 hours following administration, and then you may remove the band. If you return to the hospital for any reason within 96 hours following the administration of EXPAREL , the armband provides important information that your health care providers to know, and alerts them that you have received this anesthetic.    Possible side effects of EXPAREL : Temporary loss of sensation or ability to move in the area where medication was injected. Nausea, vomiting, constipation Rarely, numbness and tingling in your mouth or lips, lightheadedness, or anxiety may occur. Call your doctor right away if you think you may be experiencing any of these sensations, or if you have other questions regarding possible side effects.  Follow all other discharge instructions given to you by your surgeon or nurse. Eat a healthy diet and drink plenty of water or other fluids.

## 2023-08-09 NOTE — Transfer of Care (Signed)
 Immediate Anesthesia Transfer of Care Note  Patient: Sabrina Holder  Procedure(s) Performed: Procedure(s) (LRB): PANNICULECTOMY (N/A)  Patient Location: PACU  Anesthesia Type: General  Level of Consciousness: awake, oriented, sedated and patient cooperative  Airway & Oxygen  Therapy: Patient Spontanous Breathing and Patient connected to face mask oxygen   Post-op Assessment: Report given to PACU RN and Post -op Vital signs reviewed and stable  Post vital signs: Reviewed and stable  Complications: No apparent anesthesia complications  Last Vitals:  Vitals Value Taken Time  BP    Temp    Pulse 89 08/09/23 1416  Resp    SpO2 100 % 08/09/23 1416  Vitals shown include unfiled device data.  Last Pain:  Vitals:   08/09/23 0952  TempSrc: Temporal  PainSc: 0-No pain      Patients Stated Pain Goal: 4 (08/09/23 1610)  Complications: No notable events documented.

## 2023-08-09 NOTE — Interval H&P Note (Signed)
 History and Physical Interval Note: No change in exam or indication for surgery Marked with her concurrence for a panniculectomy All questions answered. Will proceed at her request  08/09/2023 11:45 AM  Sabrina Holder  has presented today for surgery, with the diagnosis of Panniculitis.  The various methods of treatment have been discussed with the patient and family. After consideration of risks, benefits and other options for treatment, the patient has consented to  Procedure(s): PANNICULECTOMY (N/A) as a surgical intervention.  The patient's history has been reviewed, patient examined, no change in status, stable for surgery.  I have reviewed the patient's chart and labs.  Questions were answered to the patient's satisfaction.     Sabrina Holder

## 2023-08-10 ENCOUNTER — Encounter (HOSPITAL_BASED_OUTPATIENT_CLINIC_OR_DEPARTMENT_OTHER): Payer: Self-pay | Admitting: Plastic Surgery

## 2023-08-12 ENCOUNTER — Telehealth: Payer: Self-pay | Admitting: Plastic Surgery

## 2023-08-12 NOTE — Telephone Encounter (Signed)
 Patient is saying that its itching around incision and would like to know if she could put Vaseline on it ? If not, what can she do to help with it? Please advise

## 2023-08-12 NOTE — Telephone Encounter (Signed)
 Attempted to call the patient with recommendations from the PA, but there was no answer so I left a voicemail to call/message us . I also sent a MyChart message with the answers to her questions.

## 2023-08-14 DIAGNOSIS — Z719 Counseling, unspecified: Secondary | ICD-10-CM

## 2023-08-15 ENCOUNTER — Ambulatory Visit: Admitting: Plastic Surgery

## 2023-08-15 ENCOUNTER — Encounter: Payer: Self-pay | Admitting: Plastic Surgery

## 2023-08-15 VITALS — BP 119/77 | HR 87 | Ht 64.0 in | Wt 174.6 lb

## 2023-08-15 DIAGNOSIS — Z9889 Other specified postprocedural states: Secondary | ICD-10-CM

## 2023-08-15 NOTE — Progress Notes (Signed)
 Sabrina Holder returns today 6 days postop from a panniculectomy.  She is doing very well with no complaints.  She notes that she has less than 30 mL/day out of both drains with the right drain being slightly less than the left.  She has no specific complaints about the incision has not had any fever or chills.  Pain is well-controlled and she is able to move around without difficulty.  The right drain is removed without difficulty.  The incisions are clean dry and intact with no erythema and no evidence of separation.  Status post panniculectomy: Doing well patient is encouraged to slowly increase activity.  She will call in on Monday if her drain output remains below 30 mL per 24 hours to have her drain removed.  Will begin scar massage at 2 weeks.

## 2023-08-16 IMAGING — CR DG CHEST 2V
2 series · 2 of 2 positions shown · non-contrast
Comparison: 10/03/2020 chest radiograph.

CLINICAL DATA: Dyspnea, asthma, negative COVID test

EXAM:
CHEST - 2 VIEW

[chest pa]
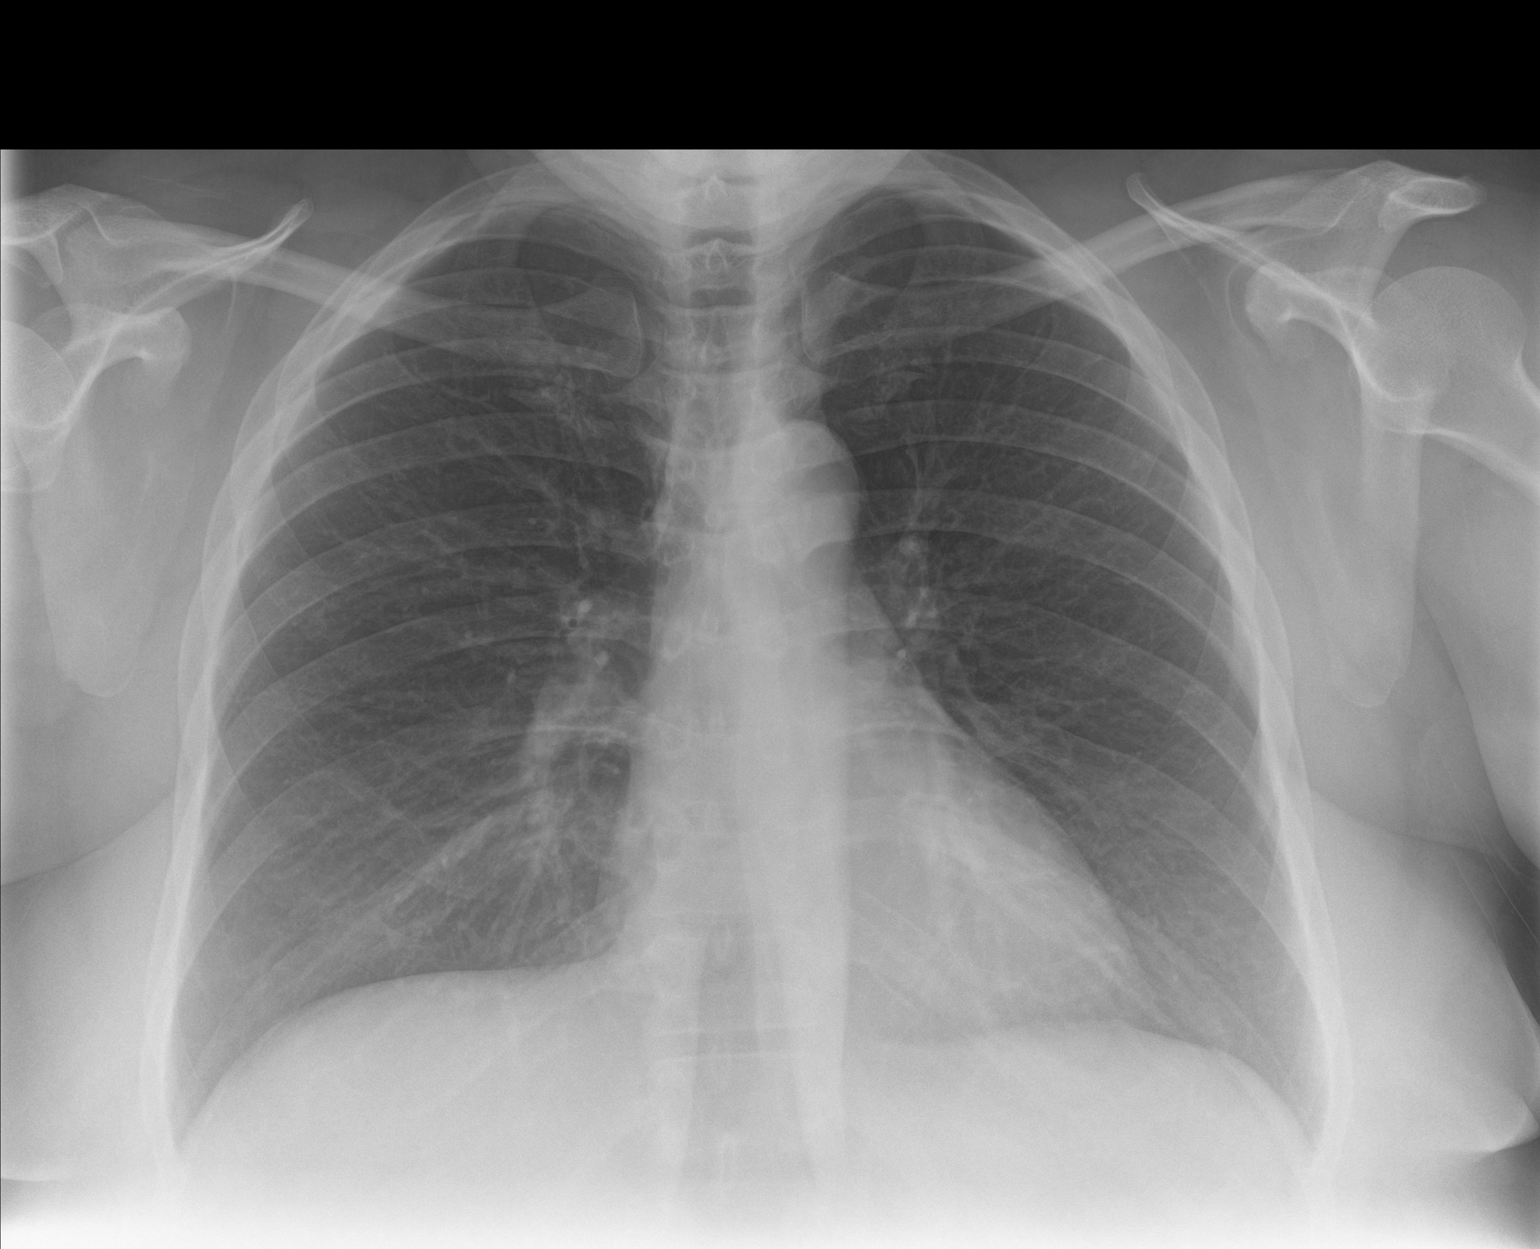

[chest lat]
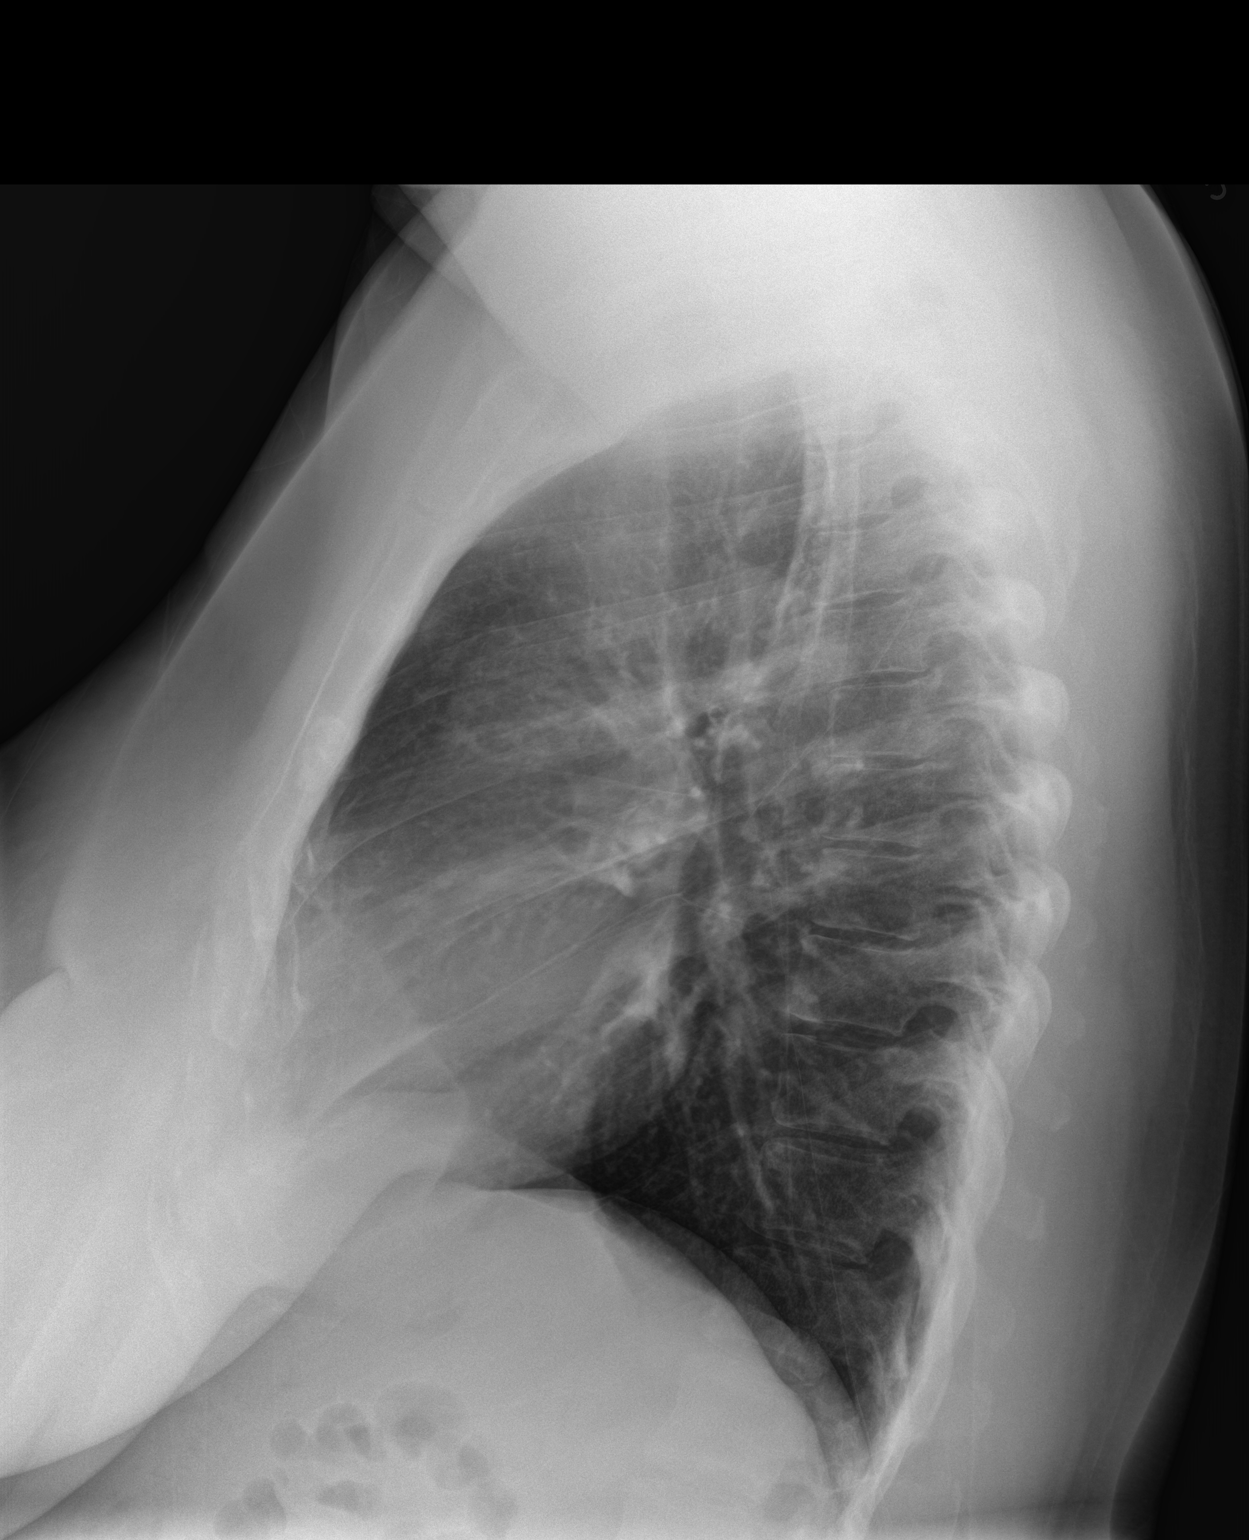

[2 of 2 positions shown; findings below may reference images not displayed]

FINDINGS: Stable cardiomediastinal silhouette with normal heart size. No
pneumothorax. No pleural effusion. Lungs appear clear, with no acute
consolidative airspace disease and no pulmonary edema.
IMPRESSION: No active cardiopulmonary disease.

## 2023-08-30 ENCOUNTER — Encounter: Payer: Self-pay | Admitting: Surgical

## 2023-08-30 ENCOUNTER — Ambulatory Visit: Admitting: Surgical

## 2023-08-30 DIAGNOSIS — Z9889 Other specified postprocedural states: Secondary | ICD-10-CM

## 2023-08-30 NOTE — Progress Notes (Signed)
 43 year old female here for follow-up after panniculectomy with Dr. Carolynne Citron.  She is doing well.  She reports she removed her left drain at home due to inability to obtain transportation to our office.  She reports she is doing well.  She is not having any specific issues.  She does report some itching along the incision but no infectious symptoms or any other issues.  Chaperone present on exam On exam abdominal incision is intact and healing very well.  There is no signs infection or concern on exam.  Abdomen is soft, nontender.  No subcutaneous fluid collection noted.  No overlying skin changes noted.  A/P:  Patient is doing really well, no signs of infection or concern on exam.  Discussed importance of continue with compression, avoid submerging incisions in water.  Avoid lifting greater than 20 pounds.  Pictures were obtained of the patient and placed in the chart with the patient's or guardian's permission.  Recommend following up in 2 weeks for reevaluation.

## 2023-09-16 ENCOUNTER — Encounter: Admitting: Physician Assistant

## 2023-09-17 ENCOUNTER — Encounter: Admitting: Physician Assistant

## 2023-10-11 ENCOUNTER — Other Ambulatory Visit: Payer: Self-pay | Admitting: Internal Medicine

## 2023-10-11 DIAGNOSIS — J4550 Severe persistent asthma, uncomplicated: Secondary | ICD-10-CM

## 2023-10-11 NOTE — Telephone Encounter (Unsigned)
 Copied from CRM [id number]. Topic: Clinical - Medication Refill >> Oct 11, 2023 11:47 AM Isabell A wrote: Medication: Dupilumab  (DUPIXENT ) 300 MG/2ML SOAJ [[id number]]    Patient is requesting a 3 month supply in case she loses her insurance.   Has the patient contacted their pharmacy? No (Agent: If no, request that the patient contact the pharmacy for the refill. If patient does not wish to contact the pharmacy document the reason why and proceed with request.) (Agent: If yes, when and what did the pharmacy advise?)  This is the patient's preferred pharmacy:  [facility] - [place], [place] - [address] [place] [address] Phone: [phone] Fax: [phone]  Is this the correct pharmacy for this prescription? Yes If no, delete pharmacy and type the correct one.   Has the prescription been filled recently? Yes  Is the patient out of the medication? Yes  Has the patient been seen for an appointment in the last year OR does the patient have an upcoming appointment? Yes  Can we respond through MyChart? No  Agent: Please be advised that Rx refills may take up to 3 business days. We ask that you follow-up with your pharmacy.

## 2023-10-12 MED ORDER — DUPIXENT 300 MG/2ML ~~LOC~~ SOAJ: SUBCUTANEOUS | 6 refills | Status: DC

## 2023-11-06 ENCOUNTER — Telehealth: Admitting: Family Medicine

## 2023-11-06 DIAGNOSIS — B9689 Other specified bacterial agents as the cause of diseases classified elsewhere: Secondary | ICD-10-CM

## 2023-11-06 DIAGNOSIS — N760 Acute vaginitis: Secondary | ICD-10-CM | POA: Diagnosis not present

## 2023-11-06 MED ORDER — METRONIDAZOLE 500 MG PO TABS: 500.0000 mg | ORAL_TABLET | Freq: Three times a day (TID) | ORAL | 0 refills | Status: AC

## 2023-11-06 NOTE — Progress Notes (Signed)

## 2023-12-12 ENCOUNTER — Other Ambulatory Visit (HOSPITAL_COMMUNITY): Payer: Self-pay

## 2023-12-12 ENCOUNTER — Telehealth: Payer: Self-pay

## 2023-12-12 NOTE — Telephone Encounter (Addendum)
 Submitted an Urgent Prior Authorization request to [facility] MEDICAID for DUPIXENT  via CoverMyMeds. Will update once we receive a response.  Key: [id number]

## 2023-12-12 NOTE — Telephone Encounter (Signed)
 Received notification from [facility] MEDICAID regarding a prior authorization for DUPIXENT . Authorization has been APPROVED from 12/12/23 to 12/11/24. Approval letter sent to scan center.  Unable to run test claim because refill too soon until 9/26.  Authorization # [id number] Phone # [phone]

## 2024-03-18 IMAGING — CR DG KNEE COMPLETE 4+V*L*
1 series · 4 of 4 positions shown · non-contrast
Comparison: No priors.

CLINICAL DATA: 40-year-old female with history of left knee pain
for 1 month. No history of injury.

EXAM:
LEFT KNEE - COMPLETE 4+ VIEW

[Series 1: dg knee complete 4 views left · 0.14mm/px · 4 of 4 slices shown]
[im 1/4]
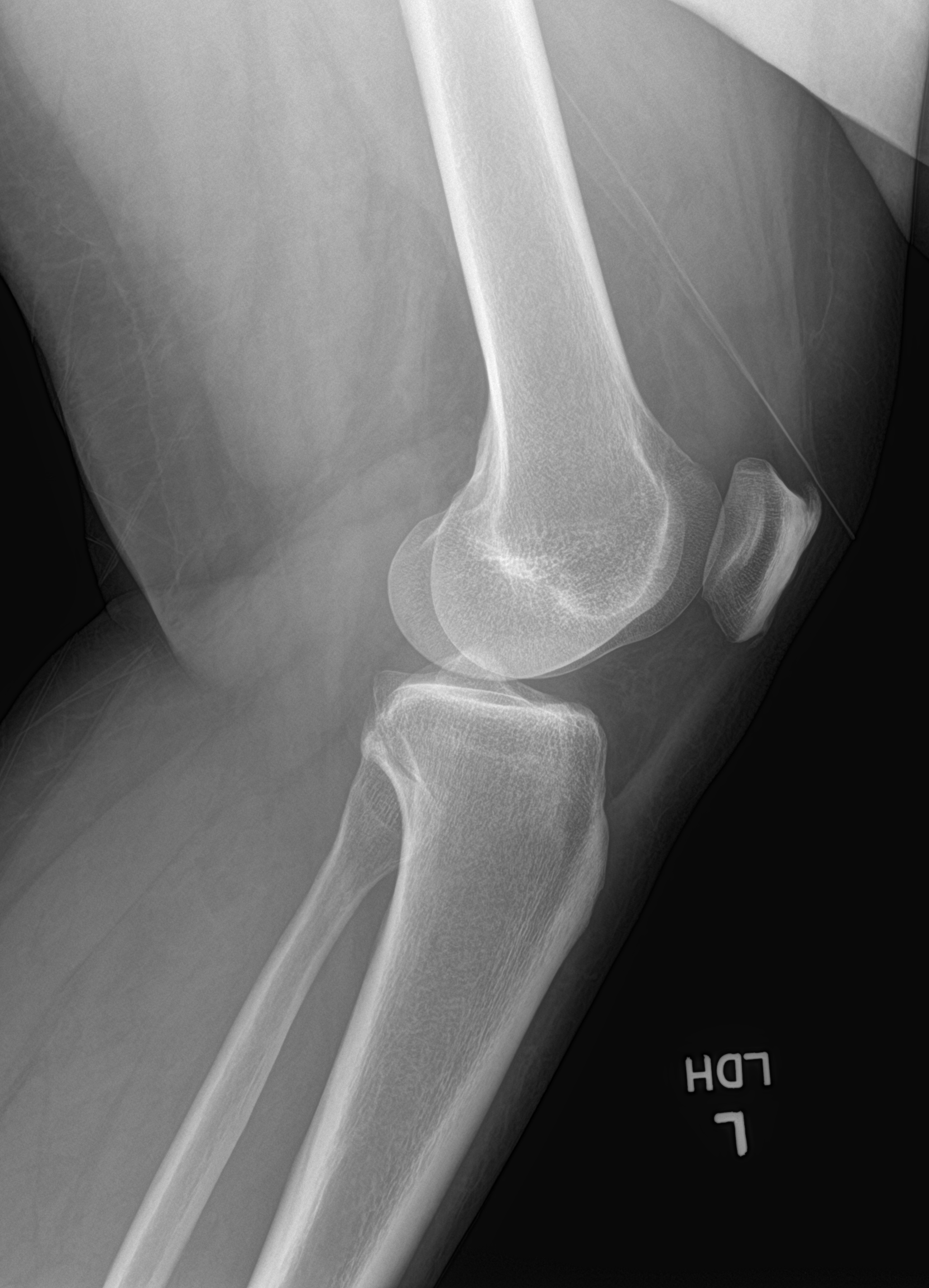
[im 2/4]
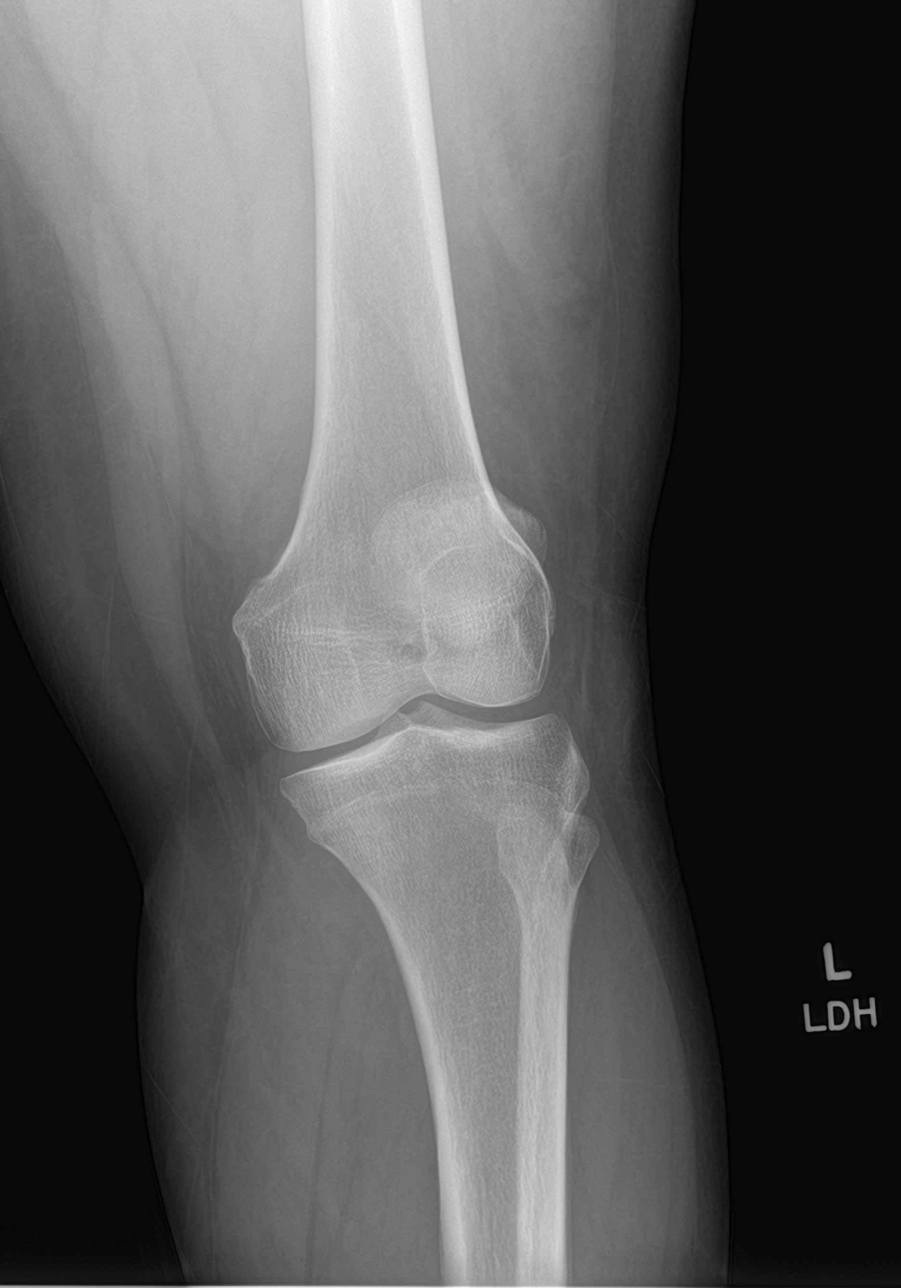
[im 3/4]
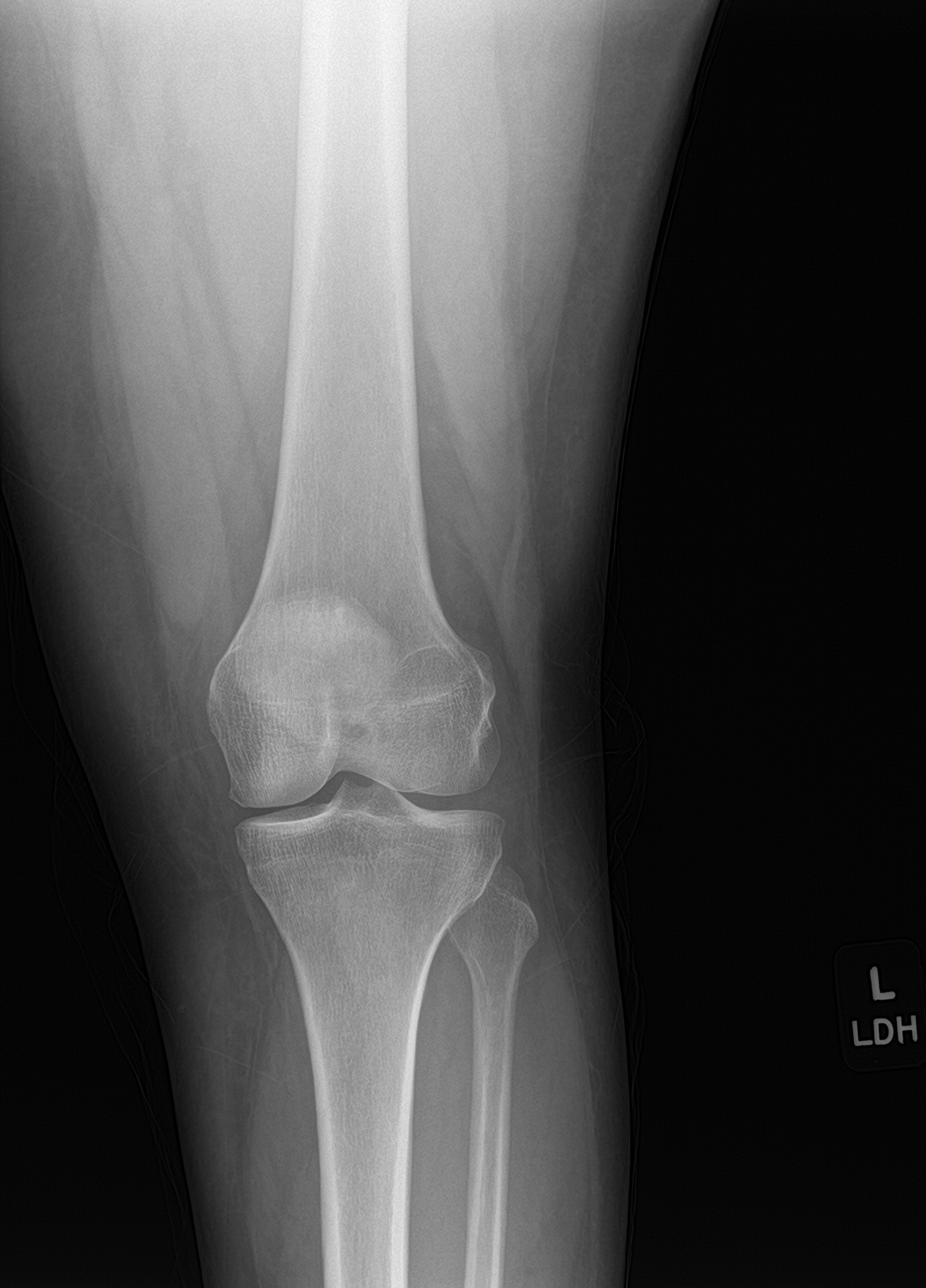
[im 4/4]
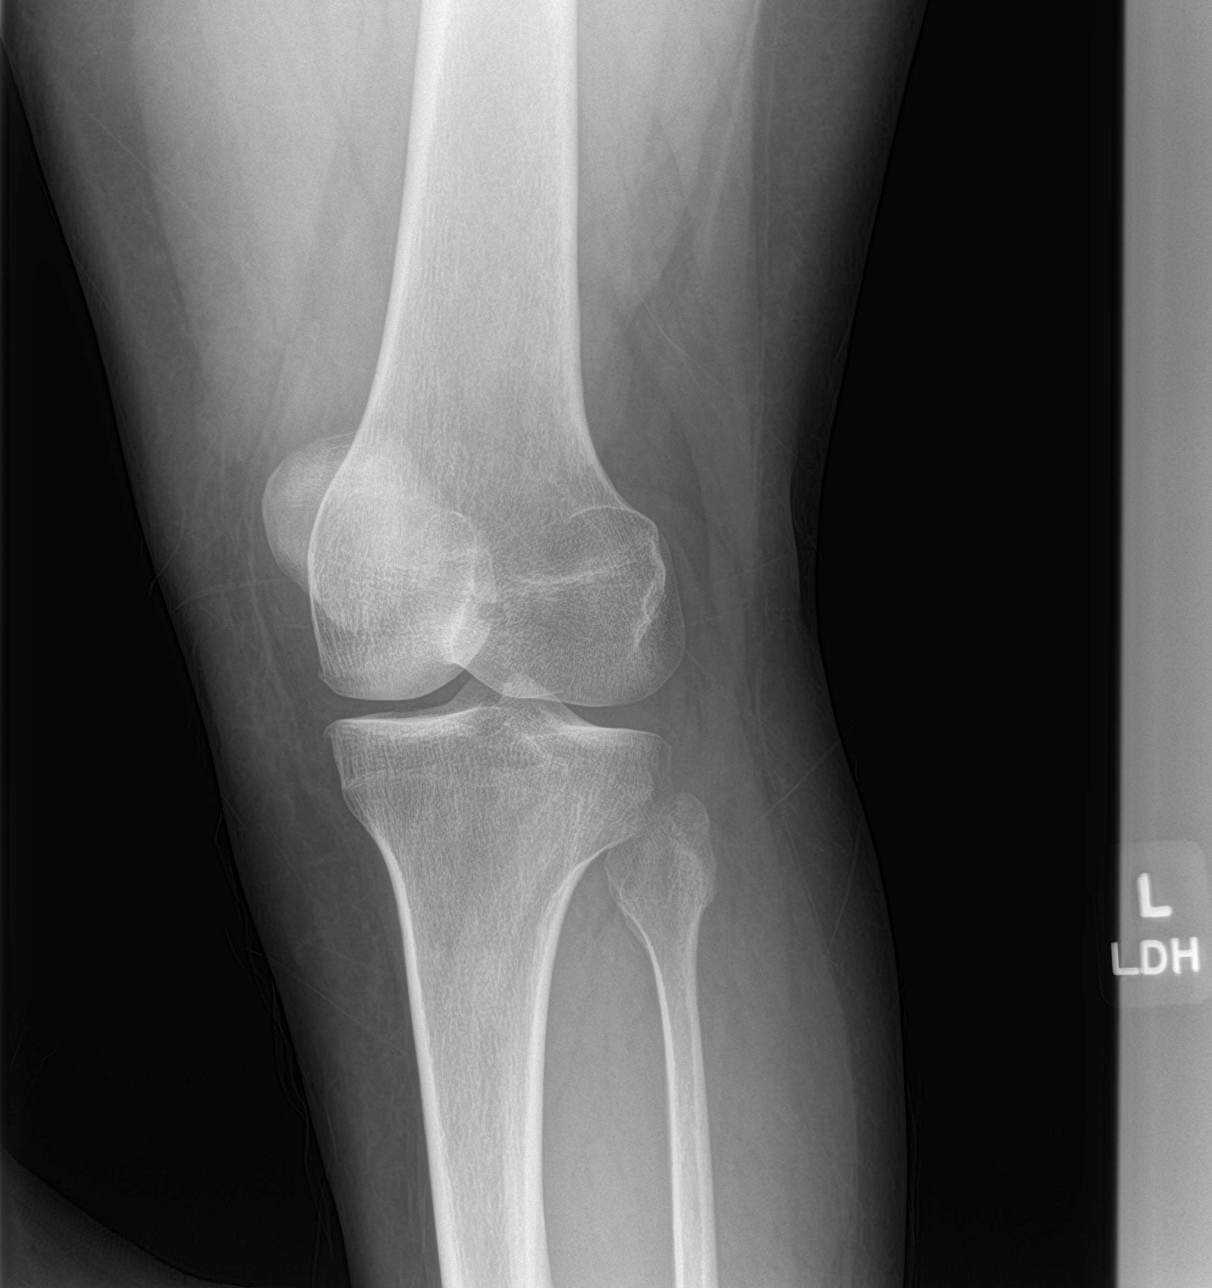

[4 of 4 positions shown; findings below may reference images not displayed]

FINDINGS: No evidence of fracture, dislocation, or joint effusion. Mild joint
space narrowing, subchondral sclerosis and osteophyte formation in
the medial compartment. No other focal bone abnormality. Soft
tissues are unremarkable.
IMPRESSION: 1. No acute radiographic abnormality of the left knee.
2. Mild osteoarthritis, most severe in the medial compartment.

## 2024-03-19 ENCOUNTER — Other Ambulatory Visit: Payer: Self-pay | Admitting: Internal Medicine

## 2024-03-19 DIAGNOSIS — J4550 Severe persistent asthma, uncomplicated: Secondary | ICD-10-CM

## 2024-03-19 NOTE — Telephone Encounter (Signed)
 I spoke with the patient. She is needing a virtual visit. I have sent Izetta Rouleau, NP a message to see if a virtual visit would be okay.

## 2024-03-19 NOTE — Telephone Encounter (Signed)
 Refill sent for DUPIXENT  to [facility]: [phone]  Dose: 300mg  [place] every 14 days  Last OV: 07/05/23 Provider: Dr. Isaiah  Next OV: overdue - due October 2025  Routing to scheduling team for follow-up on appt scheduling  Aleck Puls, PharmD, BCPS Clinical Pharmacist  [facility]

## 2024-03-23 ENCOUNTER — Encounter: Payer: Self-pay | Admitting: Nurse Practitioner

## 2024-03-23 ENCOUNTER — Telehealth (INDEPENDENT_AMBULATORY_CARE_PROVIDER_SITE_OTHER): Admitting: Nurse Practitioner

## 2024-03-23 VITALS — Ht 64.0 in | Wt 174.0 lb

## 2024-03-23 DIAGNOSIS — Z87891 Personal history of nicotine dependence: Secondary | ICD-10-CM

## 2024-03-23 DIAGNOSIS — J4550 Severe persistent asthma, uncomplicated: Secondary | ICD-10-CM

## 2024-03-23 MED ORDER — FLUTICASONE-SALMETEROL 100-50 MCG/ACT IN AEPB: 1.0000 | INHALATION_SPRAY | Freq: Two times a day (BID) | RESPIRATORY_TRACT | 11 refills | Status: AC

## 2024-03-23 NOTE — Assessment & Plan Note (Signed)
 Severe asthmatic on biologic therapy with Dupixent . Well controlled and no recent exacerbations. Slight increase in SABA use with winter season. Reviewed importance of compliance with maintenance inhaler in conjunction with biologic therapy. She will resume Advair Twice daily for maintenance. Aware of side effect profile. Trigger prevention reviewed. Action plan in place.  Patient Instructions  Continue albuterol  inhaler 2 puffs every 6 hours as needed for shortness of breath or wheezing. Notify if symptoms persist despite rescue inhaler/neb use.  Continue Dupixent  every 14 days as scheduled Restart Advair 1 puff Twice daily. Brush tongue and rinse mouth afterwards Continue montelukast  1 tab daily  Continue allergy pill daily  Asthma action plan: START albuterol  up to four times a day for worsening shortness of breath, wheezing and cough. If you symptoms do not improve in 24-48 hours, contact us  for steroid course. If your symptoms rapidly worsen, you have trouble talking or extreme difficulty breathing, or you're not getting relief from your nebulizer/rescue, go to the emergency department.  Follow up in 6 months with Dr. Isaiah. If symptoms do not improve or worsen, please contact office for sooner follow up or seek emergency care.

## 2024-03-23 NOTE — Patient Instructions (Addendum)
 Continue albuterol  inhaler 2 puffs every 6 hours as needed for shortness of breath or wheezing. Notify if symptoms persist despite rescue inhaler/neb use.  Continue Dupixent  every 14 days as scheduled Restart Advair 1 puff Twice daily. Brush tongue and rinse mouth afterwards Continue montelukast  1 tab daily  Continue allergy pill daily  Asthma action plan: START albuterol  up to four times a day for worsening shortness of breath, wheezing and cough. If you symptoms do not improve in 24-48 hours, contact us  for steroid course. If your symptoms rapidly worsen, you have trouble talking or extreme difficulty breathing, or you're not getting relief from your nebulizer/rescue, go to the emergency department.  Follow up in 6 months with Dr. Isaiah. If symptoms do not improve or worsen, please contact office for sooner follow up or seek emergency care.

## 2024-03-23 NOTE — Progress Notes (Signed)
 "  Patient ID: Sabrina Holder, female     DOB: Jun 15, 1980, 43 y.o.      MRN: [id number]  Chief Complaint  Patient presents with   Asthma    No breathing problems.     Virtual Visit via Video Note  I connected with Sabrina Holder on 03/23/2024 at 11:30 AM EST by a video enabled telemedicine application and verified that I am speaking with the correct person using two identifiers.  Location: Patient: Home Provider: Office   I discussed the limitations of evaluation and management by telemedicine and the availability of in person appointments. The patient expressed understanding and agreed to proceed.  History of Present Illness: 43 year old female, former smoker followed for severe asthma on biologic therapy. She is a patient of Dr. Jacqulyn and last seen in office 07/05/2023. Past medical history significant for GERD, prediabetes, obesity.   TESTS/EVENTS: 06/02/2022 CXR: clear  06/29/2022 eos 700   07/05/2023: OV with Dr. Isaiah. Started on Dupixent  March 2024. Uses albuterol  inhaler as needed, infrequently. More active. GERD controlled on PPI. Sleep study 2023 negative for OSA.   03/23/2024: Today - follow up Discussed the use of AI scribe software for clinical note transcription with the patient, who gave verbal consent to proceed.  History of Present Illness Sabrina Holder is a 43 year old female with asthma who presents for a follow-up visit.  Her asthma symptoms are well-controlled for the most part with her current treatment regimen, which includes Dupixent  injections and Singulair  (montelukast ). She has recently resumed taking Singulair  due to the winter season, which is more troublesome for her.   She uses her albuterol  inhaler approximately once or twice a week, primarily when she is outside. She recalls previously using a maintenance inhaler, likely Advair, but has not had this in quite some time. She did feel it helped. Breathing is doing well overall. Some  difficulties with cold weather. No cough, wheezing, hemoptysis, chest tightness, PND.   No recent use of oral steroids for asthma exacerbations and no current cough. She feels better than she has in years.    Allergies[1] Immunization History  Administered Date(s) Administered   Influenza-Unspecified 01/01/2022   Past Medical History:  Diagnosis Date   Anemia    Anxiety    Asthma    Depression    Environmental allergies    GERD (gastroesophageal reflux disease)    Renal disorder     Tobacco History: Tobacco Use History[2] Counseling given: Not Answered Tobacco comments: Quit in 2022 and recently started back.  Stopped again a week and a half ago on wellbutrin.  11/13/2021 hfb   Outpatient Medications Prior to Visit  Medication Sig Dispense Refill   albuterol  (VENTOLIN  HFA) 108 (90 Base) MCG/ACT inhaler Inhale 1-2 puffs into the lungs every 6 (six) hours as needed. 8 g 0   Dupilumab  (DUPIXENT ) 300 MG/2ML SOAJ INJECT 1 PEN UNDER THE SKIN EVERY 14 DAYS. ** Please schedule follow-up appointment for future refills. ** 4 mL 1   escitalopram (LEXAPRO) 20 MG tablet Take 20 mg by mouth daily.   1   fexofenadine  (ALLEGRA) 180 MG tablet Take 180 mg by mouth as needed.     montelukast  (SINGULAIR ) 10 MG tablet Take 1 tablet (10 mg total) by mouth daily. 30 tablet 11   omeprazole  (PRILOSEC) 20 MG capsule Take 20 mg by mouth daily.     ondansetron  (ZOFRAN -ODT) 4 MG disintegrating tablet Take 1 tablet (4 mg total) by mouth every  8 (eight) hours as needed for nausea or vomiting. 20 tablet 0   OZEMPIC, 2 MG/DOSE, 8 MG/3ML SOPN Inject 2 mg into the skin once a week.     ferrous sulfate  325 (65 FE) MG tablet Take 325 mg by mouth daily. (Patient not taking: Reported on 03/23/2024)     traMADol (ULTRAM) 50 MG tablet Take 50 mg by mouth every 6 (six) hours as needed. (Patient not taking: Reported on 03/23/2024)     No facility-administered medications prior to visit.     Review of Systems: as  above  Observations/Objective: Patient is well-developed, well-nourished in no acute distress.  Resting comfortably at home.  No labored breathing.  Speech is clear and coherent with logical content.  Patient is alert and oriented at baseline.   Assessment and Plan: Severe persistent asthma (HCC) Severe asthmatic on biologic therapy with Dupixent . Well controlled and no recent exacerbations. Slight increase in SABA use with winter season. Reviewed importance of compliance with maintenance inhaler in conjunction with biologic therapy. She will resume Advair Twice daily for maintenance. Aware of side effect profile. Trigger prevention reviewed. Action plan in place.  Patient Instructions  Continue albuterol  inhaler 2 puffs every 6 hours as needed for shortness of breath or wheezing. Notify if symptoms persist despite rescue inhaler/neb use.  Continue Dupixent  every 14 days as scheduled Restart Advair 1 puff Twice daily. Brush tongue and rinse mouth afterwards Continue montelukast  1 tab daily  Continue allergy pill daily  Asthma action plan: START albuterol  up to four times a day for worsening shortness of breath, wheezing and cough. If you symptoms do not improve in 24-48 hours, contact us  for steroid course. If your symptoms rapidly worsen, you have trouble talking or extreme difficulty breathing, or you're not getting relief from your nebulizer/rescue, go to the emergency department.  Follow up in 6 months with Dr. Isaiah. If symptoms do not improve or worsen, please contact office for sooner follow up or seek emergency care.    I discussed the assessment and treatment plan with the patient. The patient was provided an opportunity to ask questions and all were answered. The patient agreed with the plan and demonstrated an understanding of the instructions.   The patient was advised to call back or seek an in-person evaluation if the symptoms worsen or if the condition fails to improve as  anticipated.  I provided 25 minutes of non-face-to-face time during this encounter.   Sabrina LULLA Rouleau, NP      [1] No Known Allergies [2]  Social History Tobacco Use  Smoking Status Former   Current packs/day: 0.00   Average packs/day: 1 pack/day for 20.0 years (20.0 ttl pk-yrs)   Types: Cigarettes   Start date: 11/01/2001   Quit date: 11/01/2021   Years since quitting: 2.3  Smokeless Tobacco Never  Tobacco Comments   Quit in 2022 and recently started back.  Stopped again a week and a half ago on wellbutrin.  11/13/2021 hfb   "

## 2024-03-23 NOTE — Telephone Encounter (Signed)
 Pt seen today, 12/22. Well-controlled on Dupixent  for the most part. Did have her resume her maintenance inhaler, Advair. Please ensure she has refills for a year. Thanks.

## 2024-03-31 MED ORDER — DUPIXENT 300 MG/2ML ~~LOC~~ SOAJ: SUBCUTANEOUS | 1 refills | Status: DC

## 2024-03-31 NOTE — Addendum Note (Signed)
 Addended by: Tashona Calk L on: 03/31/2024 08:55 AM   Modules accepted: Orders

## 2024-04-10 NOTE — Telephone Encounter (Signed)
 Copied from CRM #[id number]. Topic: General - Other >> Apr 10, 2024  2:30 PM Rilla B wrote: Reason for CRM: Sabrina Holder with [facility].  Call to notify a prior auth was initiated for patient and want to provide:  Cover My Med Key:  [id number]  [phone]  ----------------------------------------------------------------------- From previous Reason for Contact - Med. Prior Auth: Reason for CRM:

## 2024-04-13 ENCOUNTER — Other Ambulatory Visit (HOSPITAL_COMMUNITY): Payer: Self-pay

## 2024-04-13 NOTE — Telephone Encounter (Signed)
 Attempted to submit pa through [facility] but it was cancelled stating it could not be processed. Called [facility] and verbally submitted an urgent pa. Rep advised me they have a different last name for pt but refused to tell me what they have on file. Faxed most recent chart note to [facility].  Phone #: [phone] Fax #: [phone] Case #: [id number]

## 2024-04-13 NOTE — Telephone Encounter (Signed)
 Refill was sent for 6 month supply in December. Pharmacy has more than enough fills. It looks like a PA may be required. Message sent to Sabrina Holder to get this submitted (urgent if insurance allows)  Sabrina Holder - can you submit PA plaease. CMM Key: [id number]

## 2024-04-13 NOTE — Telephone Encounter (Signed)
 Copied from CRM #[id number]. Topic: Clinical - Prescription Issue >> Apr 13, 2024  8:33 AM Corean SAUNDERS wrote: Reason for CRM: Patient states after multiple attempts[facility] Dupilumab  (DUPIXENT ) 300 MG/2ML SOAJ and is requesting Dr. Isaiah to order this for her.

## 2024-04-15 ENCOUNTER — Telehealth: Payer: Self-pay

## 2024-04-15 ENCOUNTER — Other Ambulatory Visit (HOSPITAL_COMMUNITY): Payer: Self-pay

## 2024-04-15 NOTE — Telephone Encounter (Signed)
 PA was submitted on 1/12 and approved, there is a successful claim from 1/13. See note below from refill encounter on 12/18:  Attempted to submit pa through [facility] but it was cancelled stating it could not be processed. Called [facility] and verbally submitted an urgent pa. Rep advised me they have a different last name for pt but refused to tell me what they have on file. Faxed most recent chart note to [facility].   Phone #: [phone] Fax #: [phone] Case #: [id number]

## 2024-04-15 NOTE — Telephone Encounter (Signed)
 Copied from CRM [id number]. Topic: Clinical - Medication Prior Auth >> Apr 15, 2024 11:52 AM Charolett L wrote: Reason for CRM: Patient called in and stated that she needs a PA for Dupilumab  (DUPIXENT ) 300 MG/2ML SOAJ. As per notes the PA was getting rejected and patient stated to use last names Chessica Audia so that she's able to get her medication released  Patient is requesting a call back because she's starting to wheeze

## 2024-04-15 NOTE — Telephone Encounter (Signed)
*  Pulm  Dupixent  PA to be done by [facility] [place]  Key: [id number]

## 2024-04-16 ENCOUNTER — Telehealth: Payer: Self-pay | Admitting: Pharmacist

## 2024-04-16 NOTE — Telephone Encounter (Signed)
 Received notification from [facility] regarding a prior authorization for DUPIXENT . Authorization has been APPROVED from 04/13/2024 to 04/13/2025. Approval letter sent to scan center.  Authorization # [id number]  Sherry Pennant, PharmD, MPH, BCPS, CPP Clinical Pharmacist

## 2024-04-21 ENCOUNTER — Telehealth: Admitting: Physician Assistant

## 2024-04-21 DIAGNOSIS — K047 Periapical abscess without sinus: Secondary | ICD-10-CM

## 2024-04-21 MED ORDER — AMOXICILLIN-POT CLAVULANATE 875-125 MG PO TABS: 1.0000 | ORAL_TABLET | Freq: Two times a day (BID) | ORAL | 0 refills | Status: AC

## 2024-04-21 NOTE — Progress Notes (Signed)
